# Patient Record
Sex: Female | Born: 1940 | Race: White | Hispanic: No | Smoking: Never smoker
Health system: Southern US, Community
[De-identification: ages and names within clinical notes are randomized; demographics above are authoritative.]

## PROBLEM LIST (undated history)

## (undated) DIAGNOSIS — R112 Nausea with vomiting, unspecified: Secondary | ICD-10-CM

## (undated) DIAGNOSIS — M069 Rheumatoid arthritis, unspecified: Secondary | ICD-10-CM

## (undated) DIAGNOSIS — I10 Essential (primary) hypertension: Secondary | ICD-10-CM

## (undated) DIAGNOSIS — K921 Melena: Secondary | ICD-10-CM

## (undated) DIAGNOSIS — R197 Diarrhea, unspecified: Secondary | ICD-10-CM

## (undated) DIAGNOSIS — J019 Acute sinusitis, unspecified: Secondary | ICD-10-CM

## (undated) DIAGNOSIS — J309 Allergic rhinitis, unspecified: Secondary | ICD-10-CM

## (undated) DIAGNOSIS — N189 Chronic kidney disease, unspecified: Secondary | ICD-10-CM

## (undated) DIAGNOSIS — R310 Gross hematuria: Secondary | ICD-10-CM

## (undated) DIAGNOSIS — Z5189 Encounter for other specified aftercare: Secondary | ICD-10-CM

## (undated) DIAGNOSIS — Z9889 Other specified postprocedural states: Secondary | ICD-10-CM

## (undated) DIAGNOSIS — F319 Bipolar disorder, unspecified: Secondary | ICD-10-CM

## (undated) DIAGNOSIS — I701 Atherosclerosis of renal artery: Secondary | ICD-10-CM

## (undated) DIAGNOSIS — I480 Paroxysmal atrial fibrillation: Secondary | ICD-10-CM

## (undated) DIAGNOSIS — D509 Iron deficiency anemia, unspecified: Secondary | ICD-10-CM

## (undated) DIAGNOSIS — H4940 Progressive external ophthalmoplegia, unspecified eye: Secondary | ICD-10-CM

## (undated) DIAGNOSIS — I82409 Acute embolism and thrombosis of unspecified deep veins of unspecified lower extremity: Secondary | ICD-10-CM

## (undated) DIAGNOSIS — C50919 Malignant neoplasm of unspecified site of unspecified female breast: Secondary | ICD-10-CM

## (undated) DIAGNOSIS — M1990 Unspecified osteoarthritis, unspecified site: Secondary | ICD-10-CM

## (undated) DIAGNOSIS — M797 Fibromyalgia: Secondary | ICD-10-CM

## (undated) DIAGNOSIS — C539 Malignant neoplasm of cervix uteri, unspecified: Secondary | ICD-10-CM

## (undated) DIAGNOSIS — M359 Systemic involvement of connective tissue, unspecified: Secondary | ICD-10-CM

## (undated) DIAGNOSIS — E039 Hypothyroidism, unspecified: Secondary | ICD-10-CM

## (undated) DIAGNOSIS — J45909 Unspecified asthma, uncomplicated: Secondary | ICD-10-CM

## (undated) DIAGNOSIS — R52 Pain, unspecified: Secondary | ICD-10-CM

## (undated) DIAGNOSIS — T7840XA Allergy, unspecified, initial encounter: Secondary | ICD-10-CM

## (undated) DIAGNOSIS — F32A Depression, unspecified: Secondary | ICD-10-CM

## (undated) DIAGNOSIS — Z87442 Personal history of urinary calculi: Secondary | ICD-10-CM

## (undated) DIAGNOSIS — H499 Unspecified paralytic strabismus: Secondary | ICD-10-CM

## (undated) DIAGNOSIS — Z8659 Personal history of other mental and behavioral disorders: Secondary | ICD-10-CM

## (undated) DIAGNOSIS — F329 Major depressive disorder, single episode, unspecified: Secondary | ICD-10-CM

## (undated) DIAGNOSIS — J189 Pneumonia, unspecified organism: Secondary | ICD-10-CM

## (undated) DIAGNOSIS — K219 Gastro-esophageal reflux disease without esophagitis: Secondary | ICD-10-CM

## (undated) DIAGNOSIS — K2270 Barrett's esophagus without dysplasia: Secondary | ICD-10-CM

## (undated) DIAGNOSIS — K2970 Gastritis, unspecified, without bleeding: Secondary | ICD-10-CM

## (undated) DIAGNOSIS — E785 Hyperlipidemia, unspecified: Secondary | ICD-10-CM

## (undated) DIAGNOSIS — G5600 Carpal tunnel syndrome, unspecified upper limb: Secondary | ICD-10-CM

## (undated) DIAGNOSIS — R451 Restlessness and agitation: Secondary | ICD-10-CM

## (undated) HISTORY — DX: Hyperlipidemia, unspecified: E78.5

## (undated) HISTORY — DX: Progressive external ophthalmoplegia, unspecified eye: H49.40

## (undated) HISTORY — DX: Gastritis, unspecified, without bleeding: K29.70

## (undated) HISTORY — DX: Acute embolism and thrombosis of unspecified deep veins of unspecified lower extremity: I82.409

## (undated) HISTORY — DX: Pneumonia, unspecified organism: J18.9

## (undated) HISTORY — DX: Paroxysmal atrial fibrillation: I48.0

## (undated) HISTORY — DX: Major depressive disorder, single episode, unspecified: F32.9

## (undated) HISTORY — DX: Pain, unspecified: R52

## (undated) HISTORY — DX: Depression, unspecified: F32.A

## (undated) HISTORY — PX: FOOT SURGERY: SHX648

## (undated) HISTORY — DX: Bipolar disorder, unspecified: F31.9

## (undated) HISTORY — DX: Hypothyroidism, unspecified: E03.9

## (undated) HISTORY — DX: Unspecified osteoarthritis, unspecified site: M19.90

## (undated) HISTORY — PX: CARDIAC CATHETERIZATION: SHX172

## (undated) HISTORY — DX: Barrett's esophagus without dysplasia: K22.70

## (undated) HISTORY — PX: MOLE REMOVAL: SHX2046

## (undated) HISTORY — DX: Iron deficiency anemia, unspecified: D50.9

## (undated) HISTORY — DX: Melena: K92.1

## (undated) HISTORY — DX: Restlessness and agitation: R45.1

## (undated) HISTORY — PX: MASTECTOMY: SHX3

## (undated) HISTORY — DX: Essential (primary) hypertension: I10

## (undated) HISTORY — DX: Diarrhea, unspecified: R19.7

## (undated) HISTORY — PX: TRACHEAL DILITATION: SHX5068

## (undated) HISTORY — DX: Unspecified asthma, uncomplicated: J45.909

## (undated) HISTORY — PX: TONSILLECTOMY: SUR1361

## (undated) HISTORY — PX: COLONOSCOPY: SHX174

## (undated) HISTORY — DX: Gastro-esophageal reflux disease without esophagitis: K21.9

## (undated) HISTORY — DX: Malignant neoplasm of unspecified site of unspecified female breast: C50.919

## (undated) HISTORY — DX: Chronic kidney disease, unspecified: N18.9

## (undated) HISTORY — DX: Acute sinusitis, unspecified: J01.90

## (undated) HISTORY — DX: Personal history of urinary calculi: Z87.442

## (undated) HISTORY — PX: ESOPHAGOGASTRODUODENOSCOPY: SHX1529

## (undated) HISTORY — DX: Atherosclerosis of renal artery: I70.1

## (undated) HISTORY — PX: RENAL ARTERY ANGIOPLASTY: SHX2317

## (undated) HISTORY — PX: COSMETIC SURGERY: SHX468

## (undated) HISTORY — PX: RENAL ARTERY STENT: SHX2321

## (undated) HISTORY — DX: Fibromyalgia: M79.7

## (undated) HISTORY — DX: Allergic rhinitis, unspecified: J30.9

## (undated) HISTORY — PX: EYE SURGERY: SHX253

## (undated) HISTORY — DX: Personal history of other mental and behavioral disorders: Z86.59

## (undated) HISTORY — PX: MRI: SHX5353

## (undated) HISTORY — DX: Gross hematuria: R31.0

## (undated) HISTORY — DX: Rheumatoid arthritis, unspecified: M06.9

## (undated) HISTORY — PX: ABDOMINAL HYSTERECTOMY: SHX81

## (undated) HISTORY — PX: APPENDECTOMY: SHX54

## (undated) HISTORY — DX: Encounter for other specified aftercare: Z51.89

## (undated) HISTORY — DX: Carpal tunnel syndrome, unspecified upper limb: G56.00

## (undated) HISTORY — DX: Allergy, unspecified, initial encounter: T78.40XA

## (undated) HISTORY — DX: Unspecified paralytic strabismus: H49.9

---

## 1968-05-28 HISTORY — PX: PARTIAL HYSTERECTOMY: SHX80

## 1971-05-29 DIAGNOSIS — C50919 Malignant neoplasm of unspecified site of unspecified female breast: Secondary | ICD-10-CM

## 1971-05-29 HISTORY — DX: Malignant neoplasm of unspecified site of unspecified female breast: C50.919

## 1990-05-28 HISTORY — PX: OTHER SURGICAL HISTORY: SHX169

## 1992-05-28 HISTORY — PX: OTHER SURGICAL HISTORY: SHX169

## 1995-08-27 ENCOUNTER — Encounter: Payer: Self-pay | Admitting: Family Medicine

## 1997-11-25 HISTORY — PX: CHOLECYSTECTOMY: SHX55

## 1999-03-16 ENCOUNTER — Other Ambulatory Visit: Admission: RE | Admit: 1999-03-16 | Discharge: 1999-03-16 | Payer: Self-pay | Admitting: Family Medicine

## 2000-09-11 ENCOUNTER — Other Ambulatory Visit: Admission: RE | Admit: 2000-09-11 | Discharge: 2000-09-11 | Payer: Self-pay | Admitting: Gastroenterology

## 2000-09-11 ENCOUNTER — Encounter (INDEPENDENT_AMBULATORY_CARE_PROVIDER_SITE_OTHER): Payer: Self-pay | Admitting: Specialist

## 2000-09-11 HISTORY — PX: COLONOSCOPY: SHX174

## 2000-09-11 HISTORY — PX: ESOPHAGOGASTRODUODENOSCOPY: SHX1529

## 2000-10-11 HISTORY — PX: OTHER SURGICAL HISTORY: SHX169

## 2000-10-24 ENCOUNTER — Encounter (INDEPENDENT_AMBULATORY_CARE_PROVIDER_SITE_OTHER): Payer: Self-pay | Admitting: Specialist

## 2000-10-24 ENCOUNTER — Other Ambulatory Visit: Admission: RE | Admit: 2000-10-24 | Discharge: 2000-10-24 | Payer: Self-pay | Admitting: Gastroenterology

## 2000-10-24 HISTORY — PX: ESOPHAGOGASTRODUODENOSCOPY: SHX1529

## 2003-01-27 ENCOUNTER — Encounter: Payer: Self-pay | Admitting: Family Medicine

## 2003-02-22 ENCOUNTER — Other Ambulatory Visit: Admission: RE | Admit: 2003-02-22 | Discharge: 2003-02-22 | Payer: Self-pay | Admitting: Family Medicine

## 2003-02-23 HISTORY — PX: ESOPHAGOGASTRODUODENOSCOPY: SHX1529

## 2004-01-28 HISTORY — PX: ESOPHAGOGASTRODUODENOSCOPY: SHX1529

## 2004-03-16 HISTORY — PX: ESOPHAGOGASTRODUODENOSCOPY: SHX1529

## 2004-03-30 ENCOUNTER — Ambulatory Visit: Payer: Self-pay | Admitting: Gastroenterology

## 2004-04-07 ENCOUNTER — Ambulatory Visit: Payer: Self-pay | Admitting: Ophthalmology

## 2004-04-11 ENCOUNTER — Ambulatory Visit (HOSPITAL_COMMUNITY): Admission: RE | Admit: 2004-04-11 | Discharge: 2004-04-11 | Payer: Self-pay | Admitting: Gastroenterology

## 2004-04-11 ENCOUNTER — Ambulatory Visit: Payer: Self-pay | Admitting: Gastroenterology

## 2004-04-25 ENCOUNTER — Ambulatory Visit: Payer: Self-pay | Admitting: Gastroenterology

## 2004-05-11 ENCOUNTER — Ambulatory Visit: Payer: Self-pay | Admitting: Gastroenterology

## 2004-05-26 ENCOUNTER — Ambulatory Visit: Payer: Self-pay | Admitting: Gastroenterology

## 2004-06-02 ENCOUNTER — Ambulatory Visit: Payer: Self-pay | Admitting: Gastroenterology

## 2004-06-06 ENCOUNTER — Ambulatory Visit: Payer: Self-pay | Admitting: Family Medicine

## 2004-06-06 ENCOUNTER — Ambulatory Visit: Payer: Self-pay | Admitting: Gastroenterology

## 2004-06-28 ENCOUNTER — Ambulatory Visit: Payer: Self-pay | Admitting: Gastroenterology

## 2004-07-17 ENCOUNTER — Ambulatory Visit: Payer: Self-pay | Admitting: Gastroenterology

## 2004-09-05 ENCOUNTER — Ambulatory Visit: Payer: Self-pay | Admitting: Family Medicine

## 2004-09-26 ENCOUNTER — Ambulatory Visit: Payer: Self-pay | Admitting: Family Medicine

## 2004-12-21 ENCOUNTER — Ambulatory Visit: Payer: Self-pay | Admitting: Family Medicine

## 2005-01-02 ENCOUNTER — Ambulatory Visit: Payer: Self-pay | Admitting: Family Medicine

## 2005-02-02 ENCOUNTER — Ambulatory Visit: Payer: Self-pay | Admitting: Family Medicine

## 2005-02-15 ENCOUNTER — Ambulatory Visit: Payer: Self-pay

## 2005-02-15 HISTORY — PX: OTHER SURGICAL HISTORY: SHX169

## 2005-03-05 ENCOUNTER — Ambulatory Visit: Payer: Self-pay | Admitting: Family Medicine

## 2005-05-08 ENCOUNTER — Ambulatory Visit: Payer: Self-pay | Admitting: Family Medicine

## 2005-07-15 ENCOUNTER — Emergency Department (HOSPITAL_COMMUNITY): Admission: EM | Admit: 2005-07-15 | Discharge: 2005-07-15 | Payer: Self-pay | Admitting: Emergency Medicine

## 2005-07-16 LAB — HM MAMMOGRAPHY: HM Mammogram: NORMAL

## 2005-07-19 ENCOUNTER — Ambulatory Visit: Payer: Self-pay | Admitting: Family Medicine

## 2005-07-25 ENCOUNTER — Encounter: Payer: Self-pay | Admitting: Family Medicine

## 2005-07-26 ENCOUNTER — Encounter: Payer: Self-pay | Admitting: Family Medicine

## 2005-08-08 ENCOUNTER — Ambulatory Visit: Payer: Self-pay | Admitting: Family Medicine

## 2005-08-21 ENCOUNTER — Ambulatory Visit: Payer: Self-pay | Admitting: Family Medicine

## 2005-08-26 ENCOUNTER — Encounter: Payer: Self-pay | Admitting: Family Medicine

## 2005-09-03 ENCOUNTER — Emergency Department: Payer: Self-pay | Admitting: Emergency Medicine

## 2005-09-10 HISTORY — PX: OTHER SURGICAL HISTORY: SHX169

## 2005-09-11 ENCOUNTER — Ambulatory Visit: Payer: Self-pay | Admitting: Family Medicine

## 2005-09-12 ENCOUNTER — Encounter: Admission: RE | Admit: 2005-09-12 | Discharge: 2005-09-12 | Payer: Self-pay | Admitting: Family Medicine

## 2005-09-12 ENCOUNTER — Ambulatory Visit: Payer: Self-pay | Admitting: *Deleted

## 2005-09-12 HISTORY — PX: OTHER SURGICAL HISTORY: SHX169

## 2005-09-12 HISTORY — PX: MRI: SHX5353

## 2005-09-24 HISTORY — PX: OTHER SURGICAL HISTORY: SHX169

## 2005-10-16 ENCOUNTER — Emergency Department: Payer: Self-pay | Admitting: Emergency Medicine

## 2005-10-16 ENCOUNTER — Ambulatory Visit: Payer: Self-pay | Admitting: Family Medicine

## 2006-02-19 ENCOUNTER — Emergency Department: Payer: Self-pay | Admitting: Emergency Medicine

## 2006-03-08 ENCOUNTER — Ambulatory Visit: Payer: Self-pay | Admitting: Family Medicine

## 2006-03-20 ENCOUNTER — Ambulatory Visit: Payer: Self-pay | Admitting: Family Medicine

## 2006-08-12 ENCOUNTER — Ambulatory Visit: Payer: Self-pay | Admitting: Family Medicine

## 2006-08-12 LAB — CONVERTED CEMR LAB
ALT: 15 units/L (ref 0–40)
AST: 22 units/L (ref 0–37)
Alkaline Phosphatase: 66 units/L (ref 39–117)
Basophils Absolute: 0.1 10*3/uL (ref 0.0–0.1)
Basophils Relative: 1.6 % — ABNORMAL HIGH (ref 0.0–1.0)
CO2: 28 meq/L (ref 19–32)
Ferritin: 5 ng/mL — ABNORMAL LOW (ref 10.0–291.0)
Free T4: 0.7 ng/dL (ref 0.6–1.6)
GFR calc non Af Amer: 89 mL/min
Glucose, Bld: 93 mg/dL (ref 70–99)
Hemoglobin: 11.5 g/dL — ABNORMAL LOW (ref 12.0–15.0)
MCV: 71.9 fL — ABNORMAL LOW (ref 78.0–100.0)
Monocytes Relative: 7.6 % (ref 3.0–11.0)
Neutro Abs: 3.6 10*3/uL (ref 1.4–7.7)
Neutrophils Relative %: 62.1 % (ref 43.0–77.0)
Platelets: 399 10*3/uL (ref 150–400)
Total Bilirubin: 0.5 mg/dL (ref 0.3–1.2)
Total CHOL/HDL Ratio: 3.7
Triglycerides: 238 mg/dL (ref 0–149)
Vitamin B-12: >1500 pg/mL — ABNORMAL HIGH (ref 211–911)
WBC: 5.8 10*3/uL (ref 4.5–10.5)

## 2006-09-09 ENCOUNTER — Ambulatory Visit: Payer: Self-pay | Admitting: Family Medicine

## 2006-09-10 LAB — CONVERTED CEMR LAB: OCCULT 1: POSITIVE

## 2006-09-25 LAB — CONVERTED CEMR LAB: OCCULT 2: POSITIVE

## 2006-09-30 ENCOUNTER — Telehealth: Payer: Self-pay | Admitting: Family Medicine

## 2006-10-07 ENCOUNTER — Ambulatory Visit: Payer: Self-pay | Admitting: Family Medicine

## 2006-10-10 ENCOUNTER — Ambulatory Visit: Payer: Self-pay | Admitting: Family Medicine

## 2006-10-25 ENCOUNTER — Encounter: Payer: Self-pay | Admitting: Family Medicine

## 2006-10-25 DIAGNOSIS — Z8659 Personal history of other mental and behavioral disorders: Secondary | ICD-10-CM

## 2006-10-25 DIAGNOSIS — K253 Acute gastric ulcer without hemorrhage or perforation: Secondary | ICD-10-CM | POA: Insufficient documentation

## 2006-10-25 DIAGNOSIS — IMO0001 Reserved for inherently not codable concepts without codable children: Secondary | ICD-10-CM

## 2006-10-25 HISTORY — DX: Personal history of other mental and behavioral disorders: Z86.59

## 2006-10-26 DIAGNOSIS — I10 Essential (primary) hypertension: Secondary | ICD-10-CM

## 2006-10-26 DIAGNOSIS — J309 Allergic rhinitis, unspecified: Secondary | ICD-10-CM

## 2006-10-26 DIAGNOSIS — E039 Hypothyroidism, unspecified: Secondary | ICD-10-CM

## 2006-10-26 DIAGNOSIS — E782 Mixed hyperlipidemia: Secondary | ICD-10-CM

## 2006-10-26 HISTORY — DX: Essential (primary) hypertension: I10

## 2006-10-26 HISTORY — DX: Allergic rhinitis, unspecified: J30.9

## 2006-10-28 ENCOUNTER — Ambulatory Visit: Payer: Self-pay | Admitting: Family Medicine

## 2006-10-28 DIAGNOSIS — D509 Iron deficiency anemia, unspecified: Secondary | ICD-10-CM | POA: Insufficient documentation

## 2006-10-28 DIAGNOSIS — K5900 Constipation, unspecified: Secondary | ICD-10-CM | POA: Insufficient documentation

## 2006-11-08 ENCOUNTER — Encounter (INDEPENDENT_AMBULATORY_CARE_PROVIDER_SITE_OTHER): Payer: Self-pay | Admitting: *Deleted

## 2006-12-05 ENCOUNTER — Telehealth: Payer: Self-pay | Admitting: Family Medicine

## 2006-12-30 ENCOUNTER — Encounter: Payer: Self-pay | Admitting: Family Medicine

## 2006-12-31 ENCOUNTER — Telehealth: Payer: Self-pay | Admitting: Family Medicine

## 2007-01-08 ENCOUNTER — Ambulatory Visit: Payer: Self-pay | Admitting: Gastroenterology

## 2007-01-08 ENCOUNTER — Other Ambulatory Visit: Payer: Self-pay

## 2007-01-23 ENCOUNTER — Ambulatory Visit: Payer: Self-pay | Admitting: Gastroenterology

## 2007-01-23 ENCOUNTER — Encounter: Payer: Self-pay | Admitting: Family Medicine

## 2007-01-23 HISTORY — PX: ESOPHAGOGASTRODUODENOSCOPY: SHX1529

## 2007-02-13 ENCOUNTER — Telehealth: Payer: Self-pay | Admitting: Family Medicine

## 2007-02-26 ENCOUNTER — Ambulatory Visit: Payer: Self-pay | Admitting: Family Medicine

## 2007-03-10 ENCOUNTER — Ambulatory Visit: Payer: Self-pay | Admitting: Gastroenterology

## 2007-03-10 ENCOUNTER — Encounter: Payer: Self-pay | Admitting: Family Medicine

## 2007-03-10 HISTORY — PX: ESOPHAGOGASTRODUODENOSCOPY: SHX1529

## 2007-03-21 ENCOUNTER — Encounter: Payer: Self-pay | Admitting: Family Medicine

## 2007-03-26 ENCOUNTER — Encounter: Payer: Self-pay | Admitting: Family Medicine

## 2007-03-27 ENCOUNTER — Encounter: Payer: Self-pay | Admitting: Family Medicine

## 2007-04-07 ENCOUNTER — Ambulatory Visit: Payer: Self-pay | Admitting: Rheumatology

## 2007-04-21 ENCOUNTER — Encounter: Payer: Self-pay | Admitting: Family Medicine

## 2007-05-14 ENCOUNTER — Telehealth: Payer: Self-pay | Admitting: Family Medicine

## 2007-07-07 ENCOUNTER — Encounter: Payer: Self-pay | Admitting: Family Medicine

## 2007-07-23 ENCOUNTER — Telehealth: Payer: Self-pay | Admitting: Family Medicine

## 2007-07-25 ENCOUNTER — Encounter: Payer: Self-pay | Admitting: Family Medicine

## 2007-08-05 ENCOUNTER — Telehealth (INDEPENDENT_AMBULATORY_CARE_PROVIDER_SITE_OTHER): Payer: Self-pay | Admitting: Internal Medicine

## 2007-08-10 ENCOUNTER — Encounter: Payer: Self-pay | Admitting: Family Medicine

## 2007-08-18 ENCOUNTER — Encounter: Payer: Self-pay | Admitting: Family Medicine

## 2007-08-19 ENCOUNTER — Ambulatory Visit: Payer: Self-pay | Admitting: Unknown Physician Specialty

## 2007-09-02 ENCOUNTER — Encounter: Payer: Self-pay | Admitting: Family Medicine

## 2007-09-02 ENCOUNTER — Ambulatory Visit: Payer: Self-pay | Admitting: Unknown Physician Specialty

## 2007-09-02 HISTORY — PX: ESOPHAGOGASTRODUODENOSCOPY: SHX1529

## 2007-09-22 ENCOUNTER — Telehealth: Payer: Self-pay | Admitting: Family Medicine

## 2007-10-06 ENCOUNTER — Encounter: Payer: Self-pay | Admitting: Family Medicine

## 2007-10-06 ENCOUNTER — Ambulatory Visit: Payer: Self-pay | Admitting: Unknown Physician Specialty

## 2007-10-06 HISTORY — PX: COLONOSCOPY: SHX174

## 2007-10-06 LAB — HM COLONOSCOPY: HM Colonoscopy: NORMAL

## 2007-10-08 HISTORY — PX: TUMOR EXCISION: SHX421

## 2007-10-23 ENCOUNTER — Telehealth (INDEPENDENT_AMBULATORY_CARE_PROVIDER_SITE_OTHER): Payer: Self-pay | Admitting: Internal Medicine

## 2007-11-07 ENCOUNTER — Ambulatory Visit: Payer: Self-pay | Admitting: Family Medicine

## 2007-11-10 ENCOUNTER — Ambulatory Visit: Payer: Self-pay | Admitting: Unknown Physician Specialty

## 2007-11-10 ENCOUNTER — Encounter: Payer: Self-pay | Admitting: Family Medicine

## 2007-11-10 HISTORY — PX: ESOPHAGOGASTRODUODENOSCOPY: SHX1529

## 2007-11-11 ENCOUNTER — Telehealth: Payer: Self-pay | Admitting: Family Medicine

## 2007-11-12 ENCOUNTER — Encounter: Payer: Self-pay | Admitting: Family Medicine

## 2007-11-24 ENCOUNTER — Telehealth: Payer: Self-pay | Admitting: Family Medicine

## 2007-11-24 ENCOUNTER — Emergency Department: Payer: Self-pay | Admitting: Unknown Physician Specialty

## 2007-11-24 ENCOUNTER — Other Ambulatory Visit: Payer: Self-pay

## 2007-12-04 ENCOUNTER — Telehealth: Payer: Self-pay | Admitting: Family Medicine

## 2007-12-11 ENCOUNTER — Telehealth: Payer: Self-pay | Admitting: Family Medicine

## 2007-12-11 ENCOUNTER — Ambulatory Visit: Payer: Self-pay | Admitting: Family Medicine

## 2007-12-11 DIAGNOSIS — IMO0002 Reserved for concepts with insufficient information to code with codable children: Secondary | ICD-10-CM

## 2007-12-25 ENCOUNTER — Encounter: Payer: Self-pay | Admitting: Family Medicine

## 2008-01-12 ENCOUNTER — Ambulatory Visit: Payer: Self-pay | Admitting: Family Medicine

## 2008-01-16 ENCOUNTER — Encounter: Payer: Self-pay | Admitting: Family Medicine

## 2008-01-26 ENCOUNTER — Encounter (INDEPENDENT_AMBULATORY_CARE_PROVIDER_SITE_OTHER): Payer: Self-pay | Admitting: *Deleted

## 2008-02-24 ENCOUNTER — Ambulatory Visit: Payer: Self-pay | Admitting: Family Medicine

## 2008-03-08 ENCOUNTER — Telehealth: Payer: Self-pay | Admitting: Family Medicine

## 2008-04-12 ENCOUNTER — Ambulatory Visit: Payer: Self-pay | Admitting: Family Medicine

## 2008-06-15 ENCOUNTER — Telehealth: Payer: Self-pay | Admitting: Family Medicine

## 2008-06-24 ENCOUNTER — Telehealth: Payer: Self-pay | Admitting: Family Medicine

## 2008-07-12 ENCOUNTER — Telehealth: Payer: Self-pay | Admitting: Family Medicine

## 2008-07-12 ENCOUNTER — Ambulatory Visit: Payer: Self-pay | Admitting: Family Medicine

## 2008-07-12 DIAGNOSIS — R079 Chest pain, unspecified: Secondary | ICD-10-CM

## 2008-07-14 ENCOUNTER — Telehealth: Payer: Self-pay | Admitting: Family Medicine

## 2008-07-23 ENCOUNTER — Ambulatory Visit: Payer: Self-pay | Admitting: Family Medicine

## 2008-07-23 LAB — CONVERTED CEMR LAB
Direct LDL: 152.5 mg/dL
HDL: 48 mg/dL (ref >39.0)
TSH: 2.06 microintl units/mL (ref 0.35–5.50)
Triglycerides: 271 mg/dL (ref 0–149)

## 2008-08-02 ENCOUNTER — Encounter: Payer: Self-pay | Admitting: Family Medicine

## 2008-08-09 ENCOUNTER — Ambulatory Visit: Payer: Self-pay | Admitting: Family Medicine

## 2008-08-10 ENCOUNTER — Ambulatory Visit: Payer: Self-pay | Admitting: Cardiology

## 2008-08-19 ENCOUNTER — Ambulatory Visit: Payer: Self-pay | Admitting: Family Medicine

## 2008-08-25 ENCOUNTER — Telehealth: Payer: Self-pay | Admitting: Family Medicine

## 2008-08-26 ENCOUNTER — Encounter: Payer: Self-pay | Admitting: Family Medicine

## 2008-09-06 ENCOUNTER — Telehealth: Payer: Self-pay | Admitting: Family Medicine

## 2008-09-21 ENCOUNTER — Telehealth: Payer: Self-pay | Admitting: Family Medicine

## 2008-09-28 ENCOUNTER — Ambulatory Visit: Payer: Self-pay | Admitting: Family Medicine

## 2008-09-28 DIAGNOSIS — R197 Diarrhea, unspecified: Secondary | ICD-10-CM

## 2008-09-29 LAB — CONVERTED CEMR LAB
ALT: 16 units/L (ref 0–35)
AST: 22 units/L (ref 0–37)
Albumin: 4.1 g/dL (ref 3.5–5.2)
Alkaline Phosphatase: 75 units/L (ref 39–117)
BUN: 18 mg/dL (ref 6–23)
Bilirubin, Direct: 0 mg/dL (ref 0.0–0.3)
Chloride: 109 meq/L (ref 96–112)
HCT: 33.1 % — ABNORMAL LOW (ref 36.0–46.0)
Hemoglobin: 11.2 g/dL — ABNORMAL LOW (ref 12.0–15.0)
MCHC: 33.9 g/dL (ref 30.0–36.0)
Monocytes Absolute: 0.5 10*3/uL (ref 0.1–1.0)
Monocytes Relative: 7.1 % (ref 3.0–12.0)
Neutro Abs: 3.8 10*3/uL (ref 1.4–7.7)
Platelets: 296 10*3/uL (ref 150.0–400.0)
Potassium: 3.8 meq/L (ref 3.5–5.1)
RBC: 4.14 M/uL (ref 3.87–5.11)
WBC: 6.4 10*3/uL (ref 4.5–10.5)

## 2008-10-28 ENCOUNTER — Encounter: Payer: Self-pay | Admitting: Family Medicine

## 2008-11-08 ENCOUNTER — Ambulatory Visit: Payer: Self-pay | Admitting: Unknown Physician Specialty

## 2008-11-17 ENCOUNTER — Encounter: Payer: Self-pay | Admitting: Family Medicine

## 2008-11-24 ENCOUNTER — Ambulatory Visit: Payer: Self-pay | Admitting: Family Medicine

## 2008-11-24 ENCOUNTER — Telehealth: Payer: Self-pay | Admitting: Family Medicine

## 2008-12-20 ENCOUNTER — Telehealth: Payer: Self-pay | Admitting: Family Medicine

## 2009-01-03 ENCOUNTER — Encounter: Payer: Self-pay | Admitting: Family Medicine

## 2009-03-01 ENCOUNTER — Ambulatory Visit: Payer: Self-pay | Admitting: Family Medicine

## 2009-03-09 ENCOUNTER — Encounter: Payer: Self-pay | Admitting: Family Medicine

## 2009-03-21 ENCOUNTER — Telehealth: Payer: Self-pay | Admitting: Family Medicine

## 2009-03-22 ENCOUNTER — Ambulatory Visit: Payer: Self-pay | Admitting: Psychiatry

## 2009-04-05 ENCOUNTER — Ambulatory Visit: Payer: Self-pay | Admitting: Family Medicine

## 2009-04-05 DIAGNOSIS — R5383 Other fatigue: Secondary | ICD-10-CM

## 2009-04-05 DIAGNOSIS — R5381 Other malaise: Secondary | ICD-10-CM

## 2009-04-06 LAB — CONVERTED CEMR LAB
Albumin: 4.1 g/dL (ref 3.5–5.2)
BUN: 13 mg/dL (ref 6–23)
Basophils Absolute: 0.1 10*3/uL (ref 0.0–0.1)
Basophils Relative: 1.8 % (ref 0.0–3.0)
Creatinine, Ser: 0.7 mg/dL (ref 0.4–1.2)
Eosinophils Absolute: 0.3 10*3/uL (ref 0.0–0.7)
Eosinophils Relative: 4.6 % (ref 0.0–5.0)
Folate: 7.5 ng/mL
HCT: 38.1 % (ref 36.0–46.0)
Hemoglobin: 13.1 g/dL (ref 12.0–15.0)
MCHC: 34.3 g/dL (ref 30.0–36.0)
Monocytes Absolute: 0.4 10*3/uL (ref 0.1–1.0)
Phosphorus: 3.9 mg/dL (ref 2.3–4.6)
RBC: 4.25 M/uL (ref 3.87–5.11)
RDW: 12.8 % (ref 11.5–14.6)
Saturation Ratios: 5 % — ABNORMAL LOW (ref 20.0–50.0)
TSH: 2.53 microintl units/mL (ref 0.35–5.50)
Vitamin B-12: 282 pg/mL (ref 211–911)

## 2009-04-07 ENCOUNTER — Telehealth: Payer: Self-pay | Admitting: Family Medicine

## 2009-05-30 ENCOUNTER — Ambulatory Visit: Payer: Self-pay | Admitting: Family Medicine

## 2009-06-10 ENCOUNTER — Encounter: Payer: Self-pay | Admitting: Family Medicine

## 2009-06-20 ENCOUNTER — Telehealth: Payer: Self-pay | Admitting: Family Medicine

## 2009-07-06 ENCOUNTER — Encounter: Payer: Self-pay | Admitting: Family Medicine

## 2009-07-12 ENCOUNTER — Ambulatory Visit: Payer: Self-pay | Admitting: Family Medicine

## 2009-07-12 DIAGNOSIS — R300 Dysuria: Secondary | ICD-10-CM

## 2009-07-12 LAB — CONVERTED CEMR LAB
Bilirubin Urine: NEGATIVE
Ketones, urine, test strip: NEGATIVE
Nitrite: NEGATIVE
Protein, U semiquant: NEGATIVE
pH: 5

## 2009-08-23 ENCOUNTER — Ambulatory Visit: Payer: Self-pay | Admitting: Family Medicine

## 2009-08-24 ENCOUNTER — Telehealth: Payer: Self-pay | Admitting: Family Medicine

## 2009-09-02 ENCOUNTER — Telehealth: Payer: Self-pay | Admitting: Family Medicine

## 2009-09-02 DIAGNOSIS — C50919 Malignant neoplasm of unspecified site of unspecified female breast: Secondary | ICD-10-CM | POA: Insufficient documentation

## 2009-09-19 ENCOUNTER — Telehealth: Payer: Self-pay | Admitting: Family Medicine

## 2009-10-03 ENCOUNTER — Encounter: Payer: Self-pay | Admitting: Family Medicine

## 2009-10-04 ENCOUNTER — Ambulatory Visit: Payer: Self-pay | Admitting: Family Medicine

## 2009-10-10 ENCOUNTER — Encounter: Payer: Self-pay | Admitting: Family Medicine

## 2009-10-18 ENCOUNTER — Telehealth: Payer: Self-pay | Admitting: Family Medicine

## 2009-10-18 ENCOUNTER — Encounter: Payer: Self-pay | Admitting: Family Medicine

## 2009-11-15 ENCOUNTER — Encounter: Payer: Self-pay | Admitting: Family Medicine

## 2009-11-16 ENCOUNTER — Telehealth: Payer: Self-pay | Admitting: Family Medicine

## 2009-11-18 ENCOUNTER — Telehealth: Payer: Self-pay | Admitting: Internal Medicine

## 2009-12-08 ENCOUNTER — Encounter: Payer: Self-pay | Admitting: Family Medicine

## 2009-12-15 ENCOUNTER — Telehealth: Payer: Self-pay | Admitting: Family Medicine

## 2009-12-28 ENCOUNTER — Encounter (INDEPENDENT_AMBULATORY_CARE_PROVIDER_SITE_OTHER): Payer: Self-pay | Admitting: *Deleted

## 2009-12-28 ENCOUNTER — Telehealth: Payer: Self-pay | Admitting: Family Medicine

## 2009-12-28 ENCOUNTER — Encounter: Payer: Self-pay | Admitting: Family Medicine

## 2009-12-29 ENCOUNTER — Encounter (INDEPENDENT_AMBULATORY_CARE_PROVIDER_SITE_OTHER): Payer: Self-pay | Admitting: *Deleted

## 2010-01-24 ENCOUNTER — Encounter: Payer: Self-pay | Admitting: Family Medicine

## 2010-01-26 DIAGNOSIS — R52 Pain, unspecified: Secondary | ICD-10-CM

## 2010-01-26 HISTORY — DX: Pain, unspecified: R52

## 2010-02-07 ENCOUNTER — Ambulatory Visit: Payer: Self-pay | Admitting: Family Medicine

## 2010-02-07 DIAGNOSIS — L309 Dermatitis, unspecified: Secondary | ICD-10-CM

## 2010-02-08 ENCOUNTER — Encounter: Payer: Self-pay | Admitting: Family Medicine

## 2010-02-14 ENCOUNTER — Telehealth: Payer: Self-pay | Admitting: Family Medicine

## 2010-02-15 ENCOUNTER — Telehealth: Payer: Self-pay | Admitting: Family Medicine

## 2010-02-23 ENCOUNTER — Ambulatory Visit: Payer: Self-pay | Admitting: Family Medicine

## 2010-02-23 DIAGNOSIS — M79609 Pain in unspecified limb: Secondary | ICD-10-CM

## 2010-03-01 ENCOUNTER — Telehealth: Payer: Self-pay | Admitting: Family Medicine

## 2010-03-03 ENCOUNTER — Telehealth: Payer: Self-pay | Admitting: Family Medicine

## 2010-03-06 ENCOUNTER — Encounter: Payer: Self-pay | Admitting: Family Medicine

## 2010-03-13 ENCOUNTER — Telehealth: Payer: Self-pay | Admitting: Family Medicine

## 2010-03-14 ENCOUNTER — Telehealth: Payer: Self-pay | Admitting: Family Medicine

## 2010-03-15 ENCOUNTER — Encounter: Payer: Self-pay | Admitting: Family Medicine

## 2010-03-15 ENCOUNTER — Telehealth: Payer: Self-pay | Admitting: Family Medicine

## 2010-03-30 ENCOUNTER — Ambulatory Visit: Payer: Self-pay | Admitting: Family Medicine

## 2010-03-30 ENCOUNTER — Telehealth (INDEPENDENT_AMBULATORY_CARE_PROVIDER_SITE_OTHER): Payer: Self-pay | Admitting: *Deleted

## 2010-03-30 LAB — CONVERTED CEMR LAB
Albumin: 4.3 g/dL (ref 3.5–5.2)
Alkaline Phosphatase: 80 units/L (ref 39–117)
BUN: 13 mg/dL (ref 6–23)
Basophils Relative: 0.8 % (ref 0.0–3.0)
Bilirubin, Direct: 0.1 mg/dL (ref 0.0–0.3)
CO2: 27 meq/L (ref 19–32)
Cholesterol: 228 mg/dL — ABNORMAL HIGH (ref 0–200)
Creatinine, Ser: 0.8 mg/dL (ref 0.4–1.2)
Eosinophils Absolute: 0.2 10*3/uL (ref 0.0–0.7)
Eosinophils Relative: 3.4 % (ref 0.0–5.0)
Glucose, Bld: 83 mg/dL (ref 70–99)
Hemoglobin: 15.8 g/dL — ABNORMAL HIGH (ref 12.0–15.0)
Lymphocytes Relative: 32.8 % (ref 12.0–46.0)
Lymphs Abs: 2.4 10*3/uL (ref 0.7–4.0)
MCV: 98.4 fL (ref 78.0–100.0)
Sodium: 143 meq/L (ref 135–145)
Total Bilirubin: 0.7 mg/dL (ref 0.3–1.2)
Triglycerides: 454 mg/dL — ABNORMAL HIGH (ref 0.0–149.0)
WBC: 7.3 10*3/uL (ref 4.5–10.5)

## 2010-04-03 ENCOUNTER — Ambulatory Visit: Payer: Self-pay | Admitting: Family Medicine

## 2010-04-06 ENCOUNTER — Telehealth: Payer: Self-pay | Admitting: Family Medicine

## 2010-04-11 ENCOUNTER — Ambulatory Visit: Payer: Self-pay | Admitting: Family Medicine

## 2010-04-13 ENCOUNTER — Encounter: Payer: Self-pay | Admitting: Family Medicine

## 2010-04-13 ENCOUNTER — Encounter (INDEPENDENT_AMBULATORY_CARE_PROVIDER_SITE_OTHER): Payer: Self-pay | Admitting: *Deleted

## 2010-05-02 ENCOUNTER — Telehealth: Payer: Self-pay | Admitting: Family Medicine

## 2010-05-18 ENCOUNTER — Ambulatory Visit: Payer: Self-pay | Admitting: Family Medicine

## 2010-06-13 ENCOUNTER — Telehealth: Payer: Self-pay | Admitting: Family Medicine

## 2010-06-29 NOTE — Miscellaneous (Signed)
Summary: [name] Plan of Care  [facility] Physical Therapy Plan of Care   Imported By: Beau Fanny 06/20/2009 09:53:18  _____________________________________________________________________  External Attachment:    Type:   Image     Comment:   External Document

## 2010-06-29 NOTE — Miscellaneous (Signed)
   Clinical Lists Changes  Observations: Added new observation of ZOSTAVAX: Zostavax (03/06/2010 11:49)      Immunization History:  Zostavax History:    Zostavax # 1:  Zostavax (03/06/2010)

## 2010-06-29 NOTE — Medication Information (Signed)
Summary: Fax Regarding Meds & Zostavax Order/[facility]  Zostavax Order/[facility]   Imported By: Lanelle Bal 03/09/2010 10:53:01  _____________________________________________________________________  External Attachment:    Type:   Image     Comment:   External Document

## 2010-06-29 NOTE — Letter (Signed)
Summary: [facility]: Immunoassay Fecal Occult Blood (iFOB) Order Form  [place] at [facility]  [address] [place], [place] [address]   Phone: [phone]  Fax: [phone]      [facility]: Immunoassay Fecal Occult Blood (iFOB) Order Form   April 03, 2010 MRN: [id number]   Olivia [name] March 24, 1941   Physicican Name:_____[email or web address]___________________  Diagnosis Code:_____duncan_____________________      Crawford Givens MD

## 2010-06-29 NOTE — Letter (Signed)
Summary: Nadara Eaton letter  [place] at [facility]  [address] [place], [place] [address]   Phone: [phone]  Fax: [phone]       12/29/2009 MRN: [id number]  [facility] [address] [place], [place]  [address]  Dear Ms. [facility] [place], and [place] announce the retirement of Arta Silence, M.D., from full-time practice at the [facility] office effective November 24, 2009 and his plans of returning part-time.  It is important to Dr. Hetty Ely and to our practice that you understand that [facility] has seven physicians in our office for your health care needs.  We will continue to offer the same exceptional care that you have today.    Dr. Hetty Ely has spoken to many of you about his plans for retirement and returning part-time in the fall.   We will continue to work with you through the transition to schedule appointments for you in the office and meet the high standards that [facility] is committed to.   Again, it is with great pleasure that we share the news that Dr. Hetty Ely will return to [facility] in October of 2011 with a reduced schedule.    If you have any questions, or would like to request an appointment with one of our physicians, please call us at [phone] and press the option for Scheduling an appointment.  We take pleasure in providing you with excellent patient care and look forward to seeing you at your next office visit.  Our [facility] Physicians are:  Tillman Abide, M.D. Laurita Quint, M.D. Roxy Manns, M.D. Kerby Nora, M.D. Hannah Beat, M.D. Ruthe Mannan, M.D. We proudly welcomed Raechel Ache, M.D. and Eustaquio Boyden, M.D. to the practice in July/August 2011.  Sincerely,  [facility]

## 2010-06-29 NOTE — Assessment & Plan Note (Signed)
Summary: 6WK FOLLOW UP / LFW   Vital Signs:  Patient profile:   70 year old female Weight:      177 pounds Temp:     97.6 degrees F oral Pulse rate:   80 / minute Pulse rhythm:   regular BP sitting:   118 / 70  (left arm) Cuff size:   large  Vitals Entered By: Sydell Axon LPN (Oct 04, 2009 2:36 PM) CC: 6 Week follow-up, BP with patient's machine was 140/69   History of Present Illness: Pt here for BP recheck. She runs 160/90 generally. Her machine registers approx 20-34mm  She has lots of stress in her life. She is being seen at [facility] for genetic testing.Is due to be doing that last week.  She feels well today with no complaints except for stress.  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) 12)  Anemia, Iron Deficiency  (ICD-280.9) 13)  Hypothyroidism  (ICD-244.9) 14)  Allergy, Immunotherapy  (ICD-995.3) 15)  Allergic Rhinitis  (ICD-477.9) 16)  Fibromyalgia  (ICD-729.1) 17)  Rheumatoid Arthritis (DR KERNODLE)  (ICD-714.0) 18)  Osteoarthritis (DR ZIEMINSKI/DR Gavin Potters)  (ICD-715.90) 19)  Osteoarthrosis, Generalized, Unspc Site Dr Jimmy Footman  (ICD-715.00) 20)  Diarrhea  (ICD-787.91) 21)  Constipation, Mild  (ICD-564.00) 22)  Gastritis / Barretts/stricture  (ICD-535.50) 23)  Gastric Ulcer, Acute  (ICD-531.30) 24)  Blood in Stool Via Stool Cards  (ICD-578.1) 25)  Screening For Malignannt Neoplasm, Site Nec  (ICD-V76.49) 26)  Bipolar Affective Disorder, Depressed, Hx of  (ICD-V11.8)  Medications Prior to Update: 1)  Levoxyl 50 Mcg Tabs (Levothyroxine Sodium) .Marland Kitchen.. 1 Tablet By  Mouth Once A Day 2)  Tramadol Hcl 50 Mg  Tabs (Tramadol Hcl) .Marland Kitchen.. 1 By Mouth 1 Daily 3)  Xanax Xr 2 Mg  Tb24 (Alprazolam) .Marland Kitchen.. 1 Tablet Two  Times A Day With 1/2 in Afternoon,by Mouth 4)  Citalopram Hydrobromide 20 Mg  Tabs (Citalopram Hydrobromide) .Marland Kitchen.. 1 Daily By Mouth 5)  Legatrin Pm .Marland KitchenMarland Kitchen. 1 At Bedtime 6)  Nexium 40 Mg Cpdr (Esomeprazole Magnesium) .Marland Kitchen.. 1 Daily By Mouth 45 Mins Before Brkfst. and At Bedtime 7)  Mirtazapine 15 Mg Tabs (Mirtazapine) .... 8 Pm Daily By Mouth 8)  Cholestyramine Light 4 Gm/dose Powd (Cholestyramine Light) .... Two Times A Day 9)  Tessalon 200 Mg Caps (Benzonatate) .... One Tab By Mouth Three Times A Day, Last Dose Being One Hour Prior To Bed 10)  Greenmint Mouthwash  Liqd (Mouthwashes) 11)  Toprol Xl 25 Mg Xr24h-Tab (Metoprolol Succinate) .Marland Kitchen.. 11/2 Tabs  By Mouth At Night 12)  Sandoz .... One Scoop Two Times A Day 13)  Patanol 0.1 % Soln (Olopatadine Hcl) .... One Drop Each Eye Once A Day.  Allergies: 1)  ! * Mestinon 2)  ! * Demerol  Physical Exam  General:  Well-developed,well-nourished,in no acute distress; alert,appropriate and cooperative throughout examination, minimally congested. Head:  Normocephalic and atraumatic without obvious abnormalities. No apparent alopecia or balding. Sinuses NT. Eyes:  Conjunctiva clear bilaterally. Mildly generally inflamed from allergies, but better than last time.. Ears:  External ear exam shows no significant lesions  or deformities.  Otoscopic examination reveals clear canals, tympanic membranes are intact bilaterally without bulging, retraction, inflammation or discharge. Hearing is grossly normal bilaterally. Nose:  External nasal examination shows no deformity or inflammation. Nasal mucosa are pink and moist without lesions or exudates. Mouth:  Oral mucosa and oropharynx without lesions or exudates.  Teeth in good repair. Neck:  No deformities, masses, or tenderness noted. Lungs:  Normal respiratory effort, chest  expands symmetrically. Lungs are clear to auscultation, no crackles or wheezes. Heart:  Normal rate and regular rhythm. S1 and S2 normal without gallop, murmur, click, rub or other extra sounds.   Impression & Recommendations:  Problem # 1:  HYPERTENSION (ICD-401.9) Assessment Improved Cont curr meds. Machine at home too high. Will recheck in Oct/Nov. Her updated medication list for this problem includes:    Toprol Xl 25 Mg Xr24h-tab (Metoprolol succinate) .Marland Kitchen... 11/2 tabs  by mouth at night  BP today: 118/70 Prior BP: 128/86 (08/23/2009)  Labs Reviewed: K+: 4.5 (04/05/2009) Creat: : 0.7 (04/05/2009)   Chol: 262 (07/23/2008)   HDL: 48.0 (07/23/2008)   LDL: DEL (07/23/2008)   TG: 271 (07/23/2008)  Complete Medication List: 1)  Levoxyl 50 Mcg Tabs (Levothyroxine sodium) .Marland Kitchen.. 1 tablet by mouth once a day 2)  Tramadol Hcl 50 Mg Tabs (Tramadol hcl) .Marland Kitchen.. 1 by mouth 1 daily 3)  Xanax Xr 2 Mg Tb24 (Alprazolam) .Marland Kitchen.. 1 tablet two  times a day with 1/2 in afternoon,by mouth 4)  Citalopram Hydrobromide 20 Mg Tabs (Citalopram hydrobromide) .Marland Kitchen.. 1 daily by mouth 5)  Legatrin Pm  .Marland Kitchen.. 1 at bedtime 6)  Nexium 40 Mg Cpdr (Esomeprazole magnesium) .Marland Kitchen.. 1 daily by mouth 45 mins before brkfst. and at bedtime 7)  Mirtazapine 15 Mg Tabs (Mirtazapine) .... 8 pm daily by mouth 8)  Cholestyramine Light 4 Gm/dose Powd (Cholestyramine light) .... Two times a day 9)  Tessalon 200 Mg Caps (Benzonatate) .... One tab by mouth three times a day, last dose being one hour prior to bed 10)  Greenmint Mouthwash Liqd (Mouthwashes) 11)  Toprol Xl 25 Mg Xr24h-tab (Metoprolol succinate) .Marland Kitchen.. 11/2 tabs  by mouth at night 12)  Sandoz  .... One scoop two times a day 13)  Patanol 0.1 % Soln (Olopatadine hcl) .... One drop each eye once a day.  Current Allergies (reviewed today): ! * MESTINON ! * DEMEROL

## 2010-06-29 NOTE — Letter (Signed)
Summary: [facility]  [facility]   Imported By: Lanelle Bal 02/24/2010 09:35:54  _____________________________________________________________________  External Attachment:    Type:   Image     Comment:   External Document

## 2010-06-29 NOTE — Progress Notes (Signed)
Summary: refill request for tramadol  Phone Note Refill Request Message from:  Fax from Pharmacy  Refills Requested: Medication #1:  TRAMADOL HCL 50 MG  TABS 1 by mouth 1 DAILY   Last Refilled: 05/16/2010 Faxed request from [facility], [phone].  Initial call taken by: Lowella Petties CMA, AAMA,  June 13, 2010 3:31 PM  Follow-up for Phone Call        sent in.  Follow-up by: Crawford Givens MD,  June 13, 2010 4:36 PM    Prescriptions: TRAMADOL HCL 50 MG  TABS (TRAMADOL HCL) 1 by mouth 1 DAILY  #30 x 2   Entered and Authorized by:   Crawford Givens MD   Signed by:   Crawford Givens MD on 06/13/2010   Method used:   Electronically to        [facility] (retail)       [address]       [place], [place]  [address]       Ph: [phone]       Fax: [phone]   RxID:   [id number]

## 2010-06-29 NOTE — Medication Information (Signed)
Summary: Fax Regarding Meds & Zostavax with Additional Note/[facility]  Fax Regarding Meds & Zostavax with Additional Note/[facility]   Imported By: Lanelle Bal 03/22/2010 14:02:15  _____________________________________________________________________  External Attachment:    Type:   Image     Comment:   External Document

## 2010-06-29 NOTE — Letter (Signed)
Summary: Lab Results for [employer].  Lab Results for [employer]   Imported By: Maryln Gottron 04/21/2010 13:59:00  _____________________________________________________________________  External Attachment:    Type:   Image     Comment:   External Document

## 2010-06-29 NOTE — Consult Note (Signed)
Summary: Dr.Paul Marcom,Hereditary Cancer New Pt Eval.  Dr.Paul Marcom,Hereditary Cancer New Pt Eval.   Imported By: Beau Fanny 10/21/2009 09:31:17  _____________________________________________________________________  External Attachment:    Type:   Image     Comment:   External Document

## 2010-06-29 NOTE — Letter (Signed)
Summary: [facility]  [facility]   Imported By: Maryln Gottron 01/06/2010 13:59:14  _____________________________________________________________________  External Attachment:    Type:   Image     Comment:   External Document  Appended Document: [facility]     Clinical Lists Changes  Observations: Added new observation of PAST MED HX: CHEST PAIN (ICD-786.50) HYPERTENSION (ICD-401.9) HYPERLIPIDEMIA (ICD-272.4) AGITATION (ICD-307.9) MYASTHENIA GRAVIS WITHOUT EXACERBATION, OCULAR (ICD-358.00) CARPAL TUNNEL SYNDROME (DR KERNODLE) (ICD-354.0) ANEMIA, IRON DEFICIENCY (ICD-280.9) HYPOTHYROIDISM (ICD-244.9) ALLERGY, IMMUNOTHERAPY (ICD-995.3) ALLERGIC RHINITIS (ICD-477.9) FIBROMYALGIA (ICD-729.1) RHEUMATOID ARTHRITIS  (DR KERNODLE) (ICD-714.0) OSTEOARTHRITIS   (DR ZIEMINSKI/DR Gavin Potters) (ICD-715.90) OSTEOARTHROSIS, GENERALIZED, UNSPC SITE DR ZIEMINSKI (ICD-715.00) DIARRHEA (ICD-787.91) CONSTIPATION, MILD (ICD-564.00) GASTRITIS / BARRETTS/STRICTURE (ICD-535.50) GASTRIC ULCER, ACUTE (ICD-531.30) BLOOD IN STOOL VIA STOOL CARDS (ICD-578.1) BIPOLAR AFFECTIVE DISORDER, DEPRESSED, HX OF (ICD-V11.8) 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 at [facility] (01/07/2010 19:33)       Past History:  Past Medical History: CHEST PAIN (ICD-786.50) HYPERTENSION (ICD-401.9) HYPERLIPIDEMIA (ICD-272.4) AGITATION (ICD-307.9) MYASTHENIA GRAVIS WITHOUT EXACERBATION, OCULAR (ICD-358.00) CARPAL TUNNEL SYNDROME (DR KERNODLE) (ICD-354.0) ANEMIA, IRON DEFICIENCY (ICD-280.9) HYPOTHYROIDISM (ICD-244.9) ALLERGY, IMMUNOTHERAPY (ICD-995.3) ALLERGIC RHINITIS (ICD-477.9) FIBROMYALGIA (ICD-729.1) RHEUMATOID ARTHRITIS  (DR KERNODLE) (ICD-714.0) OSTEOARTHRITIS   (DR ZIEMINSKI/DR Gavin Potters) (ICD-715.90) OSTEOARTHROSIS, GENERALIZED, UNSPC SITE DR ZIEMINSKI (ICD-715.00) DIARRHEA (ICD-787.91) CONSTIPATION, MILD (ICD-564.00) GASTRITIS / BARRETTS/STRICTURE  (ICD-535.50) GASTRIC ULCER, ACUTE (ICD-531.30) BLOOD IN STOOL VIA STOOL CARDS (ICD-578.1) BIPOLAR AFFECTIVE DISORDER, DEPRESSED, HX OF (ICD-V11.8) 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 at [facility]

## 2010-06-29 NOTE — Progress Notes (Signed)
Summary: DUKES MOUTHWASH  Phone Note Refill Request Message from:  Patient on April 06, 2010 9:33 AM  Refills Requested: Medication #1:  FIRST-DUKES MOUTHWASH  SUSP 15 mL.  Swish and spit every 6 hours as needed. pt going out of town and per pt, everytime she go out of town she gets " a mouthful of sores" is this ok to fill?   Method Requested: Electronic Initial call taken by: Mervin Hack CMA [facility]),  April 06, 2010 9:33 AM  Follow-up for Phone Call        okay to fill.   6oz, same sig as before. 1rf.  Follow-up by: Crawford Givens MD,  April 06, 2010 9:51 AM  Additional Follow-up for Phone Call Additional follow up Details #1::        Medication phoned to pharmacy. Patient Advised.  Additional Follow-up by: Delilah Shan CMA [facility]),  April 06, 2010 11:05 AM

## 2010-06-29 NOTE — Letter (Signed)
Summary: Results Follow up Letter  [place] at [facility]  [address] [place], [place] [address]   Phone: [phone]  Fax: [phone]    04/13/2010 MRN: [id number]    [facility] [address] [place], [place]  [address]    Dear Olivia [name],  The following are the results of your recent test(s):  Test         Result    Pap Smear:        Normal _____  Not Normal _____ Comments: ______________________________________________________ Cholesterol: LDL(Bad cholesterol):         Your goal is less than:         HDL (Good cholesterol):       Your goal is more than: Comments:  ______________________________________________________ Mammogram:        Normal _____  Not Normal _____ Comments:  ___________________________________________________________________ Hemoccult:        Normal __X___  Not normal _______ Comments:  Yearly follow up is recommended.   _____________________________________________________________________ Other Tests:    We routinely do not discuss normal results over the telephone.  If you desire a copy of the results, or you have any questions about this information we can discuss them at your next office visit.   Sincerely,    Dwana Curd. Para March, M.D.  [facility]

## 2010-06-29 NOTE — Progress Notes (Signed)
Summary: tramadol  Phone Note Refill Request Message from:  Fax from Pharmacy on June 20, 2009 11:36 AM  Refills Requested: Medication #1:  TRAMADOL HCL 50 MG  TABS 1 by mouth 1 DAILY   Supply Requested: 1 month   Last Refilled: 05/08/2009 [facility] [id number]   Method Requested: Electronic Initial call taken by: Benny Lennert CMA [facility]),  June 20, 2009 11:36 AM  Follow-up for Phone Call        Rx called to pharmacy Follow-up by: Benny Lennert CMA [facility]),  June 20, 2009 2:49 PM    Prescriptions: TRAMADOL HCL 50 MG  TABS (TRAMADOL HCL) 1 by mouth 1 DAILY  #30 x 2   Entered and Authorized by:   Shaune Leeks MD   Signed by:   Shaune Leeks MD on 06/20/2009   Method used:   Telephoned to ...       [facility] (retail)       [address]       [place], [place]  [address]       Ph: [phone]       Fax: [phone]   RxID:   [id number]

## 2010-06-29 NOTE — Assessment & Plan Note (Signed)
Summary: URI/ lb   Vital Signs:  Patient profile:   70 year old female Weight:      177.25 pounds Temp:     99.0 degrees F oral Pulse rate:   76 / minute Pulse rhythm:   regular BP sitting:   118 / 78  (left arm) Cuff size:   large  Vitals Entered By: Sydell Axon LPN (May 18, 2010 10:41 AM) CC: Runny nose and sneezing, has not felt good since the first of November   History of Present Illness: Pt here for acute visit for URI with rhinitis and sneezing. "I am lousy." She had ST and dyspnea early Nov. Had just about licked this but started rhinitis and runny eyes and sneezing yesterday. [place] with her family, one older lady on chemo and one on end stage meds, both on  O2 due to prior smoking.  She has rash and sores under the arms...had botox for the sweating. The lesions had just about healed using aloe from a plant she has and then used aloe from the pharmacy. It was just about healed until she wore a different blouse last week. She has seen two dermatologists. She was sent from Dr Orson Aloe to Dr Gwen Pounds to Dr Dorita Sciara PA "who laughed at her"  She has been distressed. Complaints today:  No fever but some chills, headache of significance with nasal pain from new glasses that are heavier, no ear pain but roaring, rhinitis that copiously runs clear water, no ST but scratchy and dry, cough that is less than before, nonproductive yet. She has been taking Guiafenesin. Last antibiotics a while ago.  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.00) 13)  Carpal Tunnel Syndrome (DR KERNODLE)  (ICD-354.0) 14)  Anemia, Iron Deficiency  (ICD-280.9) 15)  Hypothyroidism  (ICD-244.9) 16)  Allergy, Immunotherapy  (ICD-995.3) 17)  Allergic Rhinitis  (ICD-477.9) 18)  Fibromyalgia  (ICD-729.1) 19)  Rheumatoid Arthritis (DR KERNODLE)  (ICD-714.0) 20)  Osteoarthritis (DR ZIEMINSKI/DR Gavin Potters)  (ICD-715.90) 21)  Osteoarthrosis, Generalized, Unspc Site Dr Jimmy Footman  (ICD-715.00) 22)  Diarrhea  (ICD-787.91) 23)  Constipation, Mild  (ICD-564.00) 24)  Gastritis / Barretts/stricture  (ICD-535.50) 25)  Gastric Ulcer, Acute  (ICD-531.30) 26)  Blood in Stool Via Stool Cards  (ICD-578.1) 27)  Screening For Malignannt Neoplasm, Site Nec  (ICD-V76.49) 28)  Bipolar Affective Disorder, Depressed, Hx of  (ICD-V11.8)  Medications Prior to Update: 1)  Levoxyl 50 Mcg Tabs (Levothyroxine Sodium) .Marland Kitchen.. 1 Tablet By Mouth Once A Day 2)  Tramadol Hcl 50 Mg  Tabs (Tramadol Hcl) .Marland Kitchen.. 1 By Mouth 1 Daily 3)  Xanax Xr 2 Mg  Tb24 (Alprazolam) .Marland Kitchen.. 1 Tablet Two  Times A Day 4)  Legatrin Pm .Marland KitchenMarland Kitchen. 1 At Bedtime 5)  Nexium 40 Mg Cpdr (Esomeprazole Magnesium) .Marland Kitchen.. 1 Daily By Mouth 45 Mins Before Brkfst. and At Bedtime As Needed 6)  Greenmint Mouthwash  Liqd (Mouthwashes) 7)  Toprol Xl 25 Mg Xr24h-Tab (Metoprolol Succinate) .Marland Kitchen.. 11/2 Tabs  By Mouth  At Night 8)  Sandoz .... One Scoop Two Times A Day 9)  Patanol 0.1 % Soln (Olopatadine Hcl) .... One Drop Each Eye Once A Day As Needed 10)  First-Dukes Mouthwash  Susp (Diphenhyd-Hydrocort-Nystatin) .Marland Kitchen.. 15 Ml.  Swish and Spit Every 6 Hours As Needed. 11)  Citalopram Hydrobromide 40 Mg Tabs (Citalopram Hydrobromide) .... Take 1 Tablet By Mouth Once A Day 12)  Omeprazole 20 Mg Cpdr (Omeprazole) .... Take 1 Tablet By Mouth Two Times A Day 13)  Zyrtec Allergy 10 Mg Tabs (Cetirizine Hcl) .... Take 1 Tablet By Mouth Once A Day 14)  Fish Oil   Oil (Fish Oil) .... 1,000 Mg. 2 Tabs By Mouth  Two Times A Day 15)  Vitamin B Complex-C   Caps (B Complex-C) .... Take 1 Capsule By Mouth Once A Day 16)  Calcium Carbonate-Vitamin D 600-400 Mg-Unit  Tabs (Calcium Carbonate-Vitamin D) .... Take 1 Tablet By Mouth Once A Day 17)  L-Arginine 500 Mg Caps (Arginine) .... Take 1 Capsule By Mouth Once A Day 18)  Multivitamins   Tabs (Multiple Vitamin) .... Take 1 Tablet By Mouth Once A Day 19)  Vitamin D 1000 Unit  Tabs (Cholecalciferol) .... Take 1 Tablet By Mouth Once A Day 20)  Ferrous Sulfate 325 (65 Fe) Mg  Tabs (Ferrous Sulfate) .... Take 1 Tablet By Mouth Once A Day 21)  Vitamin B-12 500 Mcg  Tabs (Cyanocobalamin) .... 1,000 Micrograms. Once Daily 22)  Triamcinolone Acetonide 0.1 % Crea (Triamcinolone Acetonide) .... Aaa Three Times A Day As Needed For Itching 23)  Mirtazapine 30 Mg Tabs (Mirtazapine) .... Take 1 Tab By Mouth At Bedtime 24)  Hydroxyzine Hcl 25 Mg Tabs (Hydroxyzine Hcl) .... Take 1 or 2 Four Times A Day As Needed For Itching.  Allergies: 1)  ! * Mestinon 2)  ! * Demerol 3)  ! Drysol (Aluminum Chloride)  Physical Exam  General:  Well-developed,well-nourished,in no acute distress; alert,appropriate and cooperative throughout examination, congested and slightly puffy. Head:  Normocephalic and atraumatic without obvious abnormalities. No apparent alopecia or balding. Sinuses tender over maxillary area bilat.. Eyes:  Conjunctiva clear bilaterally. Mildly generally inflamed, presumably  from allergies Ears:  External ear exam shows no significant lesions or deformities.  Otoscopic examination reveals clear canals, tympanic membranes are intact bilaterally without bulging, retraction, inflammation or discharge. Hearing is grossly normal bilaterally. Nose:  External nasal examination shows no deformity or inflammation. Nasal mucosa are pink and moist without lesions or exudates but swollen and boggy with clear discharge. Mouth:  Oral mucosa and oropharynx without lesions or  exudates.  Teeth in good repair. White thick PND. Neck:  No deformities, masses, or tenderness noted. Lungs:  Normal respiratory effort, chest expands symmetrically. Lungs are clear to auscultation, no crackles or wheezes. Heart:  Normal rate and regular rhythm. S1 and S2 normal without gallop, murmur, click, rub or other extra sounds.   Impression & Recommendations:  Problem # 1:  URI (ICD-465.9) Assessment Unchanged  Prolonged...see instructions. Her updated medication list for this problem includes:    Zyrtec Allergy 10 Mg Tabs (Cetirizine hcl) .Marland Kitchen... Take 1 tablet by mouth once a day  Instructed on symptomatic treatment. Call if symptoms persist or worsen.   Her updated medication list for this problem includes:    Zyrtec Allergy 10 Mg Tabs (Cetirizine hcl) .Marland Kitchen... Take 1 tablet by mouth once a day  Problem # 2:  PAIN IN SOFT TISSUES OF LIMB (ICD-729.5) Assessment: Unchanged Have pt seen again at  Dr Philemon Kingdom office for followup of rash and botox injections.,  Complete Medication List: 1)  Levoxyl 50 Mcg Tabs (Levothyroxine sodium) .Marland Kitchen.. 1 tablet by mouth once a day 2)  Tramadol Hcl 50 Mg Tabs (Tramadol hcl) .Marland Kitchen.. 1 by mouth 1 daily 3)  Xanax Xr 2 Mg Tb24 (Alprazolam) .Marland Kitchen.. 1 tablet two  times a day 4)  Legatrin Pm  .Marland Kitchen.. 1 at bedtime 5)  Nexium 40 Mg Cpdr (Esomeprazole magnesium) .Marland Kitchen.. 1 daily by mouth 45 mins before brkfst. and at bedtime as needed 6)  Greenmint Mouthwash Liqd (Mouthwashes) 7)  Toprol Xl 25 Mg Xr24h-tab (Metoprolol succinate) .Marland Kitchen.. 11/2 tabs  by mouth at night 8)  Sandoz  .... One scoop two times a day 9)  Patanol 0.1 % Soln (Olopatadine hcl) .... One drop each eye once a day as needed 10)  First-dukes Mouthwash Susp (Diphenhyd-hydrocort-nystatin) .Marland Kitchen.. 15 ml.  swish and spit every 6 hours as needed. 11)  Citalopram Hydrobromide 40 Mg Tabs (Citalopram hydrobromide) .... Take 1 tablet by mouth once a day 12)  Omeprazole 20 Mg Cpdr (Omeprazole) .... Take 1 tablet by  mouth two times a day 13)  Zyrtec Allergy 10 Mg Tabs (Cetirizine hcl) .... Take 1 tablet by mouth once a day 14)  Fish Oil Oil (Fish oil) .... 1,000 mg. 2 tabs by mouth two times a day 15)  Vitamin B Complex-c Caps (B complex-c) .... Take 1 capsule by mouth once a day 16)  Calcium Carbonate-vitamin D 600-400 Mg-unit Tabs (Calcium carbonate-vitamin d) .... Take 1 tablet by mouth once a day 17)  L-arginine 500 Mg Caps (Arginine) .... Take 1 capsule by mouth once a day 18)  Multivitamins Tabs (Multiple vitamin) .... Take 1 tablet by mouth once a day 19)  Vitamin D 1000 Unit Tabs (Cholecalciferol) .... Take 1 tablet by mouth once a day 20)  Ferrous Sulfate 325 (65 Fe) Mg Tabs (Ferrous sulfate) .... Take 1 tablet by mouth once a day 21)  Vitamin B-12 500 Mcg Tabs (Cyanocobalamin) .... 1,000 micrograms. once daily 22)  Triamcinolone Acetonide 0.1 % Crea (Triamcinolone acetonide) .... Aaa three times a day as needed for itching 23)  Mirtazapine 30 Mg Tabs (Mirtazapine) .... Take 1 tab by mouth at bedtime 24)  Hydroxyzine Hcl 25 Mg Tabs (Hydroxyzine hcl) .... Take 1 or 2 four times a day as needed for itching. 25)  Zithromax Z-pak 250 Mg Tabs (Azithromycin) .... As dir  Patient Instructions: 1)  Pls try to get appt with Dr Gwen Pounds today or next week.  2)  Start Zithromax. 3)  Cont taking  Guaifenesin by going to [facility] or [facility] and getting MUCOUS RELIEF EXPECTORANT (400mg ), take 11/2 tabs by mouth AM and NOON. 4)  Drink lots of fluids anytime taking Guaifenesin.  5)  Cont Tyl ES 2 tabs by mouth three times a day for 4-5 days. 6)  Take Delsym per label at night for cough. Prescriptions: ZITHROMAX Z-PAK 250 MG TABS (AZITHROMYCIN) as dir  #1 pak x 0   Entered and Authorized by:   Shaune Leeks MD   Signed by:   Shaune Leeks MD on 05/18/2010   Method used:   Electronically to        [facility] (retail)       [address]       [place], [place]  [address]  Ph: [phone]       Fax: [phone]   RxID:   [id number]      Current Allergies (reviewed today): ! * MESTINON ! * DEMEROL ! DRYSOL (ALUMINUM CHLORIDE)

## 2010-06-29 NOTE — Progress Notes (Signed)
Summary: pt requests phone call  Phone Note Call from Patient Call back at Home Phone [phone]   Caller: Patient Call For: Crawford Givens MD Reason for Call: Insurance Question Complaint: Cough/Sore throat Summary of Call: Pt states that she has extreme pain under her arms, the rash is worse- weeping and running down her sides.  She is asking that you please call her. Initial call taken by: Lowella Petties CMA,  March 13, 2010 9:14 AM  Follow-up for Phone Call        Pt says she is having problems breathing- short of breath since last night.  Says she has been using inhalers without relief.  This is getting worse.  She is not going to go to ER, says they wont help her.  Follow-up by: Lowella Petties CMA,  March 13, 2010 4:31 PM  Additional Follow-up for Phone Call Additional follow up Details #1::        I told patient that if she was really SOB, then she needed to be at ER.  It is unclear if she is going to go.  If she needs to be seen for her other complaints, she can call back in the AM to get on the schedule for tomorrow.  She'll need a appointment.  Please check with scheduling about this in the AM.  Additional Follow-up by: Crawford Givens MD,  March 13, 2010 5:42 PM    Additional Follow-up for Phone Call Additional follow up Details #2::    Patient is better today, doesn't feel that she needs the appt.  Is using Aloe Plant and Aloe Spray and it is making it some better.  She is still convinced that the rash is shingles but it is under both arms.  Delilah Shan CMA Duncan Dull)  March 14, 2010 12:34 PM

## 2010-06-29 NOTE — Progress Notes (Signed)
Summary: refill request for patanol drops  Phone Note Refill Request Message from:  Fax from Pharmacy  Refills Requested: Medication #1:  PATANOL 0.1 % SOLN one drop each eye once a day..   Last Refilled: 08/24/2009 Faxed request from [facility].  Initial call taken by: Lowella Petties CMA,  Oct 18, 2009 12:04 PM    Prescriptions: PATANOL 0.1 % SOLN (OLOPATADINE HCL) one drop each eye once a day.  #1 bottle x 0   Entered and Authorized by:   Shaune Leeks MD   Signed by:   Shaune Leeks MD on 10/18/2009   Method used:   Electronically to        [facility] (retail)       [address]       [place], [place]  [address]       Ph: [phone]       Fax: [phone]   RxID:   [id number]

## 2010-06-29 NOTE — Progress Notes (Signed)
Summary: refill request for tramadol  Phone Note Refill Request Message from:  Fax from Pharmacy  Refills Requested: Medication #1:  TRAMADOL HCL 50 MG  TABS 1 by mouth 1 DAILY   Last Refilled: 02/13/2010 Faxed request from [facility], [phone]   Initial call taken by: Lowella Petties CMA,  March 15, 2010 11:03 AM  Follow-up for Phone Call        please call in.  Follow-up by: Crawford Givens MD,  March 15, 2010 11:23 AM  Additional Follow-up for Phone Call Additional follow up Details #1::        Medication phoned to pharmacy.  Additional Follow-up by: Delilah Shan CMA Duncan Dull),  March 15, 2010 1:45 PM    Prescriptions: TRAMADOL HCL 50 MG  TABS (TRAMADOL HCL) 1 by mouth 1 DAILY  #30 x 2   Entered and Authorized by:   Crawford Givens MD   Signed by:   Crawford Givens MD on 03/15/2010   Method used:   Telephoned to ...       [facility] (retail)       [address]       [place], [place]  [address]       Ph: [phone]       Fax: [phone]   RxID:   [id number]

## 2010-06-29 NOTE — Assessment & Plan Note (Signed)
Summary: F/U DLO   Vital Signs:  Patient profile:   70 year old female Weight:      179 pounds Temp:     98.1 degrees F oral Pulse rate:   84 / minute Pulse rhythm:   regular BP sitting:   140 / 94  (left arm) Cuff size:   large  Vitals Entered By: Sydell Axon LPN (July 12, 2009 2:36 PM) CC: Follow-up on BP   History of Present Illness: Pt here for BP followup. She got a digital BP cuff and her recordings are all elevated...some as high as 190/90s.  Her eyes are red today from crying...she had to put her cat to sleep yesterday.  She has also finished her Physical Therapy. She is better but still has minor difficulty withbalance and cannot yet totally do the exercises. She also feels she may have an early UTI, urine is cloudy with odor.  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)  (ICD-354.0) 11)  Anemia, Iron Deficiency  (ICD-280.9) 12)  Hypothyroidism  (ICD-244.9) 13)  Allergy, Immunotherapy  (ICD-995.3) 14)  Allergic Rhinitis  (ICD-477.9) 15)  Fibromyalgia  (ICD-729.1) 16)  Rheumatoid Arthritis (DR KERNODLE)  (ICD-714.0) 17)  Osteoarthritis (DR ZIEMINSKI/DR Gavin Potters)  (ICD-715.90) 18)  Osteoarthrosis, Generalized, Unspc Site Dr Jimmy Footman  (ICD-715.00) 19)  Diarrhea  (ICD-787.91) 20)  Constipation, Mild  (ICD-564.00) 21)  Gastritis / Barretts/stricture  (ICD-535.50) 22)  Gastric Ulcer, Acute  (ICD-531.30) 23)  Blood in Stool Via Stool Cards  (ICD-578.1) 24)  Screening For Malignannt Neoplasm, Site Nec  (ICD-V76.49) 25)  Bipolar Affective Disorder, Depressed, Hx of  (ICD-V11.8)  Medications Prior to Update: 1)   Levoxyl 50 Mcg Tabs (Levothyroxine Sodium) .Marland Kitchen.. 1 Tablet By Mouth Once A Day 2)  Tramadol Hcl 50 Mg  Tabs (Tramadol Hcl) .Marland Kitchen.. 1 By Mouth 1 Daily 3)  Xanax Xr 2 Mg  Tb24 (Alprazolam) .Marland Kitchen.. 1 Tablet Three Times A Day By Mouth 4)  Citalopram Hydrobromide 20 Mg  Tabs (Citalopram Hydrobromide) .Marland Kitchen.. 1 Daily By Mouth 5)  Legatrin Pm .Marland KitchenMarland Kitchen. 1 At Bedtime 6)  Nexium 40 Mg Cpdr (Esomeprazole Magnesium) .Marland Kitchen.. 1 Daily By Mouth 45 Mins Before Brkfst. and At Bedtime 7)  Mirtazapine 15 Mg Tabs (Mirtazapine) .... 8 Pm Daily By Mouth 8)  Cholestyramine Light 4 Gm/dose Powd (Cholestyramine Light) .... Two Times A Day 9)  Tessalon 200 Mg Caps (Benzonatate) .... One Tab By Mouth Three Times A Day, Last Dose Being One Hour Prior To Bed 10)  Greenmint Mouthwash  Liqd (Mouthwashes)  Allergies: 1)  ! * Mestinon 2)  ! * Demerol  Physical Exam  General:  Well-developed,well-nourished,in no acute distress; alert,appropriate and cooperative throughout examination, minimally congested. Head:  Normocephalic and atraumatic without obvious abnormalities. No apparent alopecia or balding. Sinuses NT. Eyes:  Conjunctiva clear bilaterally. Mildly generally inflamed from crying. Ears:  External ear exam shows no significant lesions or deformities.  Otoscopic examination reveals clear canals, tympanic membranes are intact bilaterally without bulging, retraction, inflammation or discharge. Hearing is grossly normal bilaterally. Nose:  External nasal examination shows no deformity or inflammation. Nasal mucosa are pink and moist without lesions  or exudates. Mouth:  Oral mucosa and oropharynx without lesions or exudates.  Teeth in good repair. Neck:  No deformities, masses, or tenderness noted. Lungs:  Normal respiratory effort, chest expands symmetrically. Lungs are clear to auscultation, no crackles or wheezes. Heart:  Normal rate and regular rhythm. S1 and S2 normal without gallop, murmur, click, rub or other extra sounds. Abdomen:   No suprapubic tenderness Msk:  No CVAT.   Impression & Recommendations:  Problem # 1:  HYPERTENSION (ICD-401.9) Assessment Unchanged Start medication. Recheck. Her updated medication list for this problem includes:    Toprol Xl 25 Mg Xr24h-tab (Metoprolol succinate) ..... One tab by mouth at night  BP today: 140/94 Prior BP: 142/100 (05/30/2009)  Labs Reviewed: K+: 4.5 (04/05/2009) Creat: : 0.7 (04/05/2009)   Chol: 262 (07/23/2008)   HDL: 48.0 (07/23/2008)   LDL: DEL (07/23/2008)   TG: 271 (07/23/2008)  Problem # 2:  DYSURIA (ICD-788.1) Assessment: New  U/a Micro looks ok. Will send culture. Push fluids. Orders: UA Dipstick W/ Micro (manual) ([address]) Specimen Handling ([address]) T-Culture, Urine ([id number])  Encouraged to push clear liquids, get enough rest, and take acetaminophen as needed. To be seen in 10 days if no improvement, sooner if worse.  Problem # 3:  DIZZINESS/ BALANCE PROBLEMS (ICD-780.4) Assessment: Improved  Continue exercises learned in PT.  Problem # 4:  OSTEOARTHRITIS   (DR ZIEMINSKI/DR Gavin Potters) (ICD-715.90) Assessment: Unchanged  Hands cont to be involved. Use Tyl regularly. Her updated medication list for this problem includes:    Tramadol Hcl 50 Mg Tabs (Tramadol hcl) .Marland Kitchen... 1 by mouth 1 daily  Discussed use of medications, application of heat or cold, and exercises.   Complete Medication List: 1)  Levoxyl 50 Mcg Tabs (Levothyroxine sodium) .Marland Kitchen.. 1 tablet by mouth once a day 2)  Tramadol Hcl 50 Mg Tabs (Tramadol hcl) .Marland Kitchen.. 1 by mouth 1 daily 3)  Xanax Xr 2 Mg Tb24 (Alprazolam) .Marland Kitchen.. 1 tablet three times a day by mouth 4)  Citalopram Hydrobromide 20 Mg Tabs (Citalopram hydrobromide) .Marland Kitchen.. 1 daily by mouth 5)  Legatrin Pm  .Marland Kitchen.. 1 at bedtime 6)  Nexium 40 Mg Cpdr (Esomeprazole magnesium) .Marland Kitchen.. 1 daily by mouth 45 mins before brkfst. and at bedtime 7)  Mirtazapine 15 Mg Tabs (Mirtazapine) .... 8 pm daily by mouth 8)  Cholestyramine Light 4 Gm/dose Powd  (Cholestyramine light) .... Two times a day 9)  Tessalon 200 Mg Caps (Benzonatate) .... One tab by mouth three times a day, last dose being one hour prior to bed 10)  Greenmint Mouthwash Liqd (Mouthwashes) 11)  Toprol Xl 25 Mg Xr24h-tab (Metoprolol succinate) .... One tab by mouth at night  Patient Instructions: 1)  RTC 6 weeks for BP check Prescriptions: TOPROL XL 25 MG XR24H-TAB (METOPROLOL SUCCINATE) one tab by mouth at night  #30 x 12   Entered and Authorized by:   Shaune Leeks MD   Signed by:   Shaune Leeks MD on 07/12/2009   Method used:   Electronically to        [facility] (retail)       [address]       [place], [place]  [address]       Ph: [phone]       Fax: [phone]   RxID:   [id number]   Current Allergies (reviewed today): ! * MESTINON ! * DEMEROL  Laboratory Results   Urine Tests  Date/Time Received: July 12, 2009 3:23 PM  Date/Time Reported: July 12, 2009 3:23 PM   Routine Urinalysis   Color: yellow Appearance: Clear Glucose: negative   (Normal Range: Negative) Bilirubin: negative   (Normal Range: Negative) Ketone: negative   (Normal Range: Negative) Spec. Gravity: 1.010   (Normal Range: 1.003-1.035) Blood: negative   (Normal Range: Negative) pH: 5.0   (Normal Range: 5.0-8.0) Protein: negative   (Normal Range: Negative) Urobilinogen: 0.2   (Normal Range: 0-1) Nitrite: negative   (Normal Range: Negative) Leukocyte Esterace: small   (Normal Range: Negative)        Appended Document: F/U DLO U/a Micro  0-1 WBCs  0 RBCs   0 Bact   Rare Epis
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
## 2010-06-29 NOTE — Letter (Signed)
Summary: Dr.Paul Marcom,[facility] Results Note  Dr.Paul Marcom,[facility] Results Note   Imported By: Beau Fanny 11/07/2009 13:53:05  _____________________________________________________________________  External Attachment:    Type:   Image     Comment:   External Document

## 2010-06-29 NOTE — Progress Notes (Signed)
Summary: pt wants list of meds used for sweating  Phone Note Call from Patient Call back at Home Phone [phone] Call back at [phone]   Caller: Patient Call For: Shaune Leeks MD Summary of Call: Pt states she has profuse sweating under her arms.  She is asking for a list of meds that you have given  her for this through the years.  Paper chart is on your shelf.  Please mail to pt when ready. Initial call taken by: Lowella Petties CMA,  November 16, 2009 3:15 PM  Follow-up for Phone Call        Discussed the fact that I have no idea of any trmt I have given her for hyperhydrosis.  Follow-up by: Shaune Leeks MD,  November 16, 2009 6:12 PM

## 2010-06-29 NOTE — Letter (Signed)
Summary: Schaller Retirement letter  [facility] [place]  [address]   [place], [place] [address]   Phone: [phone]  Fax: [phone]       12/29/2009 MRN: [id number]  Olivia [name] [address] [place], [place]  [address]  Dear [name],  [facility] [place], and [facility] announce the retirement of ROBERT N. SCHALLER, M.D., from full-time practice at the [place] office effective November 24, 2009 and his plans of returning part-time.  It is important to Dr. Schaller and to our practice that you understand that [facility] [place] has seven physicians in our office for your health care needs.  We will continue to offer the same exceptional care that you have today.    Dr. Schaller has spoken to many of you about his plans for retirement and returning part-time in the fall.   We will continue to work with you through the transition to schedule appointments for you in the office and meet the high standards that [facility] is committed to.   Again, it is with great pleasure that we share the news that Dr. Schaller will return to  [facility] [place] in October of 2011 with a reduced schedule.    If you have any questions, or would like to request an appointment with one of our physicians, please call us at [phone] and press the option for Scheduling an appointment.  We take pleasure in providing you with excellent patient care and look forward to seeing you at your next office visit.  Our [place] Physicians are:  Richard Letvak, M.D. Robert Schaller, M.D. Marne Tower, M.D. Amy Bedsole, M.D. Spencer Copland, M.D. Talia Aron, M.D. We proudly welcomed Shaw Duncan, M.D. and Javier Gutierrez, M.D. to the practice in July/August 2011.  Sincerely,   [facility] [place] 

## 2010-06-29 NOTE — Progress Notes (Signed)
Summary: regarding BRCA testing  Phone Note Call from Patient Call back at Home Phone [phone] Call back at [phone]   Caller: Patient Call For: Shaune Leeks MD Summary of Call: Pt is asking if she can have BRCA, one or two, testing done to determine if she has a gene for breast cancer.  She has a personal hx of breast cancer and several family members  have had, or now have, breast cancer.  She wants to know if she has the gene to better protect her daughters and grand daughter.  Her cousin and her cousins daughter are currently being treated in [place] and their doctor has recommended that the pt have this test.  Please advise.  Pt knows you are out till monday. Initial call taken by: Lowella Petties CMA,  September 02, 2009 1:00 PM  Follow-up for Phone Call        Spoke with pt. Our lab does not do breast cancer genetic counselling/testing. She has has=d her mastectiomies and reconstruction at [facility]. They have a breast cancer center which does genetic testing. Please get appt with [facility] for genetic testing.  [phone]. Follow-up by: Shaune Leeks MD,  September 05, 2009 9:08 AM  Additional Follow-up for Phone Call Additional follow up Details #1::        Leola GJ has contact with [facility]. We are waiting to hear back from them to see if they will take Georgianna as a patient. Additional Follow-up by: Carlton Adam,  September 08, 2009 3:56 PM  New Problems: BREAST CANCER (ICD-174.9)   Additional Follow-up for Phone Call Additional follow up Details #2::    Dean GJ would not see her as a patient. Called [facility]etics testing and they set her up an appt for 09/19/2009. They will evaluate for Risk and hopefully the testing which is not covered my Medicare will be paid for by a grant or something to that nature.  Follow-up by: Carlton Adam,  September 13, 2009 4:23 PM  New Problems: BREAST CANCER (ICD-174.9)

## 2010-06-29 NOTE — Progress Notes (Signed)
Summary: refill request for magic mouth wash  Phone Note Refill Request Message from:  Fax from Pharmacy  Refills Requested: Medication #1:  magic mouth wash   Last Refilled: 07/30/2009 Faxed request from [facility], this is not on med list. Dr. Lorenza Chick pt, form is on your desk.  Initial call taken by: Lowella Petties CMA,  November 18, 2009 12:07 PM  Follow-up for Phone Call        okay 240cc x 0 Follow-up by: Cindee Salt MD,  November 18, 2009 1:40 PM  Additional Follow-up for Phone Call Additional follow up Details #1::        Rx faxed to pharmacy Additional Follow-up by: DeShannon Smith CMA Duncan Dull),  November 18, 2009 3:14 PM    New/Updated Medications: FIRST-DUKES MOUTHWASH  SUSP (DIPHENHYD-HYDROCORT-NYSTATIN) use as directed Prescriptions: FIRST-DUKES MOUTHWASH  SUSP (DIPHENHYD-HYDROCORT-NYSTATIN) use as directed  #240cc x 0   Entered by:   Mervin Hack CMA (AAMA)   Authorized by:   Cindee Salt MD   Signed by:   Mervin Hack CMA (AAMA) on 11/18/2009   Method used:   Electronically to        [facility] (retail)       [address]       [place], [place]  [address]       Ph: [phone]       Fax: [phone]   RxID:   [id number]

## 2010-06-29 NOTE — Progress Notes (Signed)
Summary: patanol eye drops  Phone Note Call from Patient Call back at Home Phone [phone]   Caller: Patient Call For: Shaune Leeks MD Summary of Call: Patient was seen yesterday and she is calling requesting a rx for the patanol eye drops be sent to [facility]. She says that they received the toprol, but not the eye drops.  Initial call taken by: Melody Comas,  August 24, 2009 10:28 AM  Follow-up for Phone Call        Sorry to her...thought I did this. Now done. Follow-up by: Shaune Leeks MD,  August 24, 2009 12:24 PM  Additional Follow-up for Phone Call Additional follow up Details #1::        Patient notified. She said there is no need to apologize.  Additional Follow-up by: Melody Comas,  August 24, 2009 12:48 PM    Prescriptions: PATANOL 0.1 % SOLN (OLOPATADINE HCL) one drop each eye once a day.  #1 bottle x 0   Entered and Authorized by:   Shaune Leeks MD   Signed by:   Shaune Leeks MD on 08/24/2009   Method used:   Electronically to        [facility] (retail)       [address]       [place], [place]  [address]       Ph: [phone]       Fax: [phone]   RxID:   [id number]

## 2010-06-29 NOTE — Miscellaneous (Signed)
  Medications Added FIRST-DUKES MOUTHWASH  SUSP (DIPHENHYD-HYDROCORT-NYSTATIN) 15 mL.  Swish and spit every 6 hours as needed.       Clinical Lists Changes  Medications: Changed medication from FIRST-DUKES MOUTHWASH  SUSP (DIPHENHYD-HYDROCORT-NYSTATIN) use as directed to FIRST-DUKES MOUTHWASH  SUSP (DIPHENHYD-HYDROCORT-NYSTATIN) 15 mL.  Swish and spit every 6 hours as needed.

## 2010-06-29 NOTE — Letter (Signed)
Summary: [facility]  [facility]   Imported By: Lanelle Bal 02/24/2010 09:37:32  _____________________________________________________________________  External Attachment:    Type:   Image     Comment:   External Document

## 2010-06-29 NOTE — Assessment & Plan Note (Signed)
Summary: 6WK F/U FOR BP CHECK / LFW   Vital Signs:  Patient profile:   70 year old female Weight:      175.75 pounds Temp:     97.8 degrees F oral Pulse rate:   64 / minute Pulse rhythm:   regular BP sitting:   128 / 86  (left arm) Cuff size:   large  Vitals Entered By: Sydell Axon LPN (August 23, 2009 2:14 PM) CC: 6 Week follow-up on BP   History of Present Illness: Pt here for followup BP. She feels well and is doing fine. She is tolerating her Toprol well with no complaints. She has been decreased slightly on her Xanx, actually realistically takes it two times a day, very occasionally has a half tab in middle of the day.  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)  (ICD-354.0) 11)  Anemia, Iron Deficiency  (ICD-280.9) 12)  Hypothyroidism  (ICD-244.9) 13)  Allergy, Immunotherapy  (ICD-995.3) 14)  Allergic Rhinitis  (ICD-477.9) 15)  Fibromyalgia  (ICD-729.1) 16)  Rheumatoid Arthritis (DR KERNODLE)  (ICD-714.0) 17)  Osteoarthritis (DR ZIEMINSKI/DR Gavin Potters)  (ICD-715.90) 18)  Osteoarthrosis, Generalized, Unspc Site Dr Jimmy Footman  (ICD-715.00) 19)  Diarrhea  (ICD-787.91) 20)  Constipation, Mild  (ICD-564.00) 21)  Gastritis / Barretts/stricture  (ICD-535.50) 22)  Gastric Ulcer, Acute  (ICD-531.30) 23)  Blood in Stool Via Stool Cards  (ICD-578.1) 24)  Screening For Malignannt Neoplasm, Site Nec  (ICD-V76.49) 25)  Bipolar Affective Disorder, Depressed, Hx of  (ICD-V11.8)  Medications Prior to Update: 1)  Levoxyl 50 Mcg Tabs (Levothyroxine Sodium) .Marland Kitchen.. 1 Tablet By Mouth Once A Day 2)  Tramadol Hcl 50 Mg  Tabs (Tramadol Hcl)  .Marland Kitchen.. 1 By Mouth 1 Daily 3)  Xanax Xr 2 Mg  Tb24 (Alprazolam) .Marland Kitchen.. 1 Tablet Three Times A Day By Mouth 4)  Citalopram Hydrobromide 20 Mg  Tabs (Citalopram Hydrobromide) .Marland Kitchen.. 1 Daily By Mouth 5)  Legatrin Pm .Marland KitchenMarland Kitchen. 1 At Bedtime 6)  Nexium 40 Mg Cpdr (Esomeprazole Magnesium) .Marland Kitchen.. 1 Daily By Mouth 45 Mins Before Brkfst. and At Bedtime 7)  Mirtazapine 15 Mg Tabs (Mirtazapine) .... 8 Pm Daily By Mouth 8)  Cholestyramine Light 4 Gm/dose Powd (Cholestyramine Light) .... Two Times A Day 9)  Tessalon 200 Mg Caps (Benzonatate) .... One Tab By Mouth Three Times A Day, Last Dose Being One Hour Prior To Bed 10)  Greenmint Mouthwash  Liqd (Mouthwashes) 11)  Toprol Xl 25 Mg Xr24h-Tab (Metoprolol Succinate) .... One Tab By Mouth At Night  Allergies: 1)  ! * Mestinon 2)  ! * Demerol  Physical Exam  General:  Well-developed,well-nourished,in no acute distress; alert,appropriate and cooperative throughout examination, minimally congested. Head:  Normocephalic and atraumatic without obvious abnormalities. No apparent alopecia or balding. Sinuses NT. Eyes:  Conjunctiva clear bilaterally. Mildly generally inflamed from allergies. Lungs:  Normal respiratory effort, chest expands symmetrically. Lungs are clear to auscultation, no crackles or wheezes. Heart:  Normal rate and regular rhythm. S1 and S2 normal without gallop, murmur, click, rub or other extra sounds.   Impression & Recommendations:  Problem # 1:  HYPERTENSION (ICD-401.9)  Will increase slightly as her  nos at home are higher. Bring her cuff next time to check here. Her updated medication list for this problem includes:    Toprol Xl 25 Mg Xr24h-tab (Metoprolol succinate) .Marland Kitchen... 11/2 tabs  by mouth at night  BP today: 128/86 Prior BP: 140/94 (07/12/2009)  Labs Reviewed: K+: 4.5 (04/05/2009) Creat: : 0.7 (04/05/2009)   Chol: 262 (07/23/2008)   HDL: 48.0 (07/23/2008)   LDL: DEL (07/23/2008)   TG: 271 (07/23/2008)  Orders: Prescription Created  Electronically [phone])  Problem # 2:  ALLERGY, IMMUNOTHERAPY (ICD-995.3) Assessment: Deteriorated Eyes significantly inflamed. Try Patanol.  See instructions...OTC antihist.  Complete Medication List: 1)  Levoxyl 50 Mcg Tabs (Levothyroxine sodium) .Marland Kitchen.. 1 tablet by mouth once a day 2)  Tramadol Hcl 50 Mg Tabs (Tramadol hcl) .Marland Kitchen.. 1 by mouth 1 daily 3)  Xanax Xr 2 Mg Tb24 (Alprazolam) .Marland Kitchen.. 1 tablet two  times a day with 1/2 in afternoon,by mouth 4)  Citalopram Hydrobromide 20 Mg Tabs (Citalopram hydrobromide) .Marland Kitchen.. 1 daily by mouth 5)  Legatrin Pm  .Marland Kitchen.. 1 at bedtime 6)  Nexium 40 Mg Cpdr (Esomeprazole magnesium) .Marland Kitchen.. 1 daily by mouth 45 mins before brkfst. and at bedtime 7)  Mirtazapine 15 Mg Tabs (Mirtazapine) .... 8 pm daily by mouth 8)  Cholestyramine Light 4 Gm/dose Powd (Cholestyramine light) .... Two times a day 9)  Tessalon 200 Mg Caps (Benzonatate) .... One tab by mouth three times a day, last dose being one hour prior to bed 10)  Greenmint Mouthwash Liqd (Mouthwashes) 11)  Toprol Xl 25 Mg Xr24h-tab (Metoprolol succinate) .Marland Kitchen.. 11/2 tabs  by mouth at night 12)  Sandoz  .... One scoop two times a day 13)  Patanol 0.1 % Soln (Olopatadine hcl) .... One drop each eye once a day.  Patient Instructions: 1)  RTC 6 weeks.  2)  Try Claritin Zyrtec or Allegra daily. 3)  Try Patanol drops for the eyes. 4)  Increase Toprol to 11/2 tabs by mouth. Prescriptions: TOPROL XL 25 MG XR24H-TAB (METOPROLOL SUCCINATE) 11/2 tabs  by mouth at night  #45 x 12   Entered and Authorized by:   Shaune Leeks MD   Signed by:   Shaune Leeks MD on 08/23/2009   Method used:   Electronically to        [facility] (retail)       [address]       [place], [place]  [address]       Ph: [phone]       Fax: [phone]   RxID:   [id number]   Current Allergies (reviewed today): ! * MESTINON ! * DEMEROL

## 2010-06-29 NOTE — Progress Notes (Signed)
Summary: triamcinolon  Phone Note Refill Request Message from:  Fax from Pharmacy on February 14, 2010 4:20 PM  Refills Requested: Medication #1:  TRIAMCINOLONE ACETONIDE 0.1 % CREA AAA three times a day as needed for itching.   Last Refilled: 02/07/2010 Refill request from [facility]. [phone]  Initial call taken by: Melody Comas,  February 14, 2010 4:20 PM  Follow-up for Phone Call        done.  Follow-up by: Crawford Givens MD,  February 15, 2010 11:48 AM    Prescriptions: TRIAMCINOLONE ACETONIDE 0.1 % CREA (TRIAMCINOLONE ACETONIDE) AAA three times a day as needed for itching  #15g x 1   Entered and Authorized by:   Crawford Givens MD   Signed by:   Crawford Givens MD on 02/15/2010   Method used:   Electronically to        [facility] (retail)       [address]       [place], [place]  [address]       Ph: [phone]       Fax: [phone]   RxID:   [id number]

## 2010-06-29 NOTE — Progress Notes (Signed)
Summary: rash is worse  Phone Note Call from Patient Call back at Home Phone [phone] Call back at [phone]   Caller: Patient Summary of Call: Pt was seen last week for rash under her arms, given steroid cream. She says this worked for about 3 days but now the rash is worse, painful.  She will run out of the cream today and she is asking if she should refill and continue this or try something else.  Uses [facility] Initial call taken by: Lowella Petties CMA,  February 14, 2010 8:56 AM  Follow-up for Phone Call        I would refill it and set her up with derm.  I would like their input.  If she'll ok the referral, I'd refer.   Follow-up by: Crawford Givens MD,  February 14, 2010 9:56 AM  Additional Follow-up for Phone Call Additional follow up Details #1::        Patient says she has already seen 2 dermatologists before seeing you.  Dr. Orson Aloe in [place] did all that he knew to do and has been treating her for years.  He sent her to Dr. Gwen Pounds who did Botox but she says it "back-fired".  Dr. Gwen Pounds wanted to set her up with the [facility] but the Pain Clinic never called to set up the appt.  Therefore, she came to you because it was getting so bad.  Lugene Fuquay CMA Duncan Dull)  February 14, 2010 10:21 AM     Additional Follow-up for Phone Call Additional follow up Details #2::    She could get a third opinion from derm (for consideration of higher potency topcial steroids) or contact Dr. Gwen Pounds about the pain clinic referral.  I would do one or the other.  I am hesitant to use higher potency steroids on the axilla w/o derm opinion   Follow-up by: Crawford Givens MD,  February 14, 2010 10:26 AM  Additional Follow-up for Phone Call Additional follow up Details #3:: Details for Additional Follow-up Action Taken: Patient Advised. Lugene Fuquay CMA Duncan Dull)  February 14, 2010 10:43 AM

## 2010-06-29 NOTE — Progress Notes (Signed)
  Phone Note Outgoing Call   Summary of Call: I called patient's pharmacy.  I also d/w Dr. Hetty Ely.  I asked pharmacy to see if they could compound a med that would be similar to dry-off.  They are going to check on this and then let me know.  Please let the patient know and I'll send word when I have more information.  Okay to call tomorrow.  Initial call taken by: Crawford Givens MD,  March 01, 2010 5:44 PM  Follow-up for Phone Call        Patient Advised.   Should she take a Zostavax Vaccine?  Her pharmacy can give it to her. Follow-up by: Delilah Shan CMA Bryce Cheever Dull),  March 02, 2010 9:23 AM  Additional Follow-up for Phone Call Additional follow up Details #1::        Yes.  Additional Follow-up by: Crawford Givens MD,  March 02, 2010 10:36 AM    Additional Follow-up for Phone Call Additional follow up Details #2::    Patient Advised.  Follow-up by: Delilah Shan CMA (AAMA),  March 02, 2010 11:04 AM

## 2010-06-29 NOTE — Progress Notes (Signed)
Summary: note from pharmacist  Phone Note From Pharmacy   Caller: [facility] Summary of Call: Note from pharacist at [facility] is on your desk, regarding zostavax and deodorant. Initial call taken by: Lowella Petties CMA,  March 03, 2010 4:27 PM  Follow-up for Phone Call        please fax hard copy back.  Follow-up by: Crawford Givens MD,  March 05, 2010 6:49 PM  Additional Follow-up for Phone Call Additional follow up Details #1::        Signed form faxed back to pharmcy as instructed. Additional Follow-up by: Sydell Axon LPN,  March 06, 2010 9:16 AM

## 2010-06-29 NOTE — Progress Notes (Signed)
Summary: refill request for tramadol  Phone Note Refill Request Message from:  Fax from Pharmacy  Refills Requested: Medication #1:  TRAMADOL HCL 50 MG  TABS 1 by mouth 1 DAILY   Last Refilled: 11/15/2009 Faxed request from [facility], [phone]   Initial call taken by: Lowella Petties CMA,  December 15, 2009 12:38 PM    Prescriptions: TRAMADOL HCL 50 MG  TABS (TRAMADOL HCL) 1 by mouth 1 DAILY  #30 x 2   Entered and Authorized by:   Crawford Givens MD   Signed by:   Crawford Givens MD on 12/15/2009   Method used:   Electronically to        [facility] (retail)       [address]       [place], [place]  [address]       Ph: [phone]       Fax: [phone]   RxID:   [id number]

## 2010-06-29 NOTE — Letter (Signed)
Summary: Pt's Daily Blood Pressure Readings-08/23/09-10/03/09  Pt's Daily Blood Pressure Readings-08/23/09-10/03/09   Imported By: Beau Fanny 10/06/2009 08:31:35  _____________________________________________________________________  External Attachment:    Type:   Image     Comment:   External Document  Appended Document: Pt's Daily Blood Pressure Readings-08/23/09-10/03/09 These numbers Hg high in Sys readings per comparing with our office nos.

## 2010-06-29 NOTE — Progress Notes (Signed)
Summary: wants referral to dermatologist  Phone Note Call from Patient Call back at Home Phone [phone] Call back at [phone]- dont leave message   Caller: Patient Call For: Dr. Para March Summary of Call: Pt wants referral to dermatologist, someone very good- maybe [facility] or [facility].  Wants something asap.  She will need her cream refilled until then. Initial call taken by: Lowella Petties CMA,  February 15, 2010 9:16 AM  Follow-up for Phone Call        referral ordered.  Follow-up by: Crawford Givens MD,  February 15, 2010 11:49 AM

## 2010-06-29 NOTE — Progress Notes (Signed)
Summary: records from derm  Phone Note Call from Patient   Caller: Patient Summary of Call: Pt wanted you to know that one of her dermatologist's office will be faxing her records, so that you will be more familiar with her problem. Initial call taken by: Lowella Petties CMA,  February 15, 2010 4:28 PM  Follow-up for Phone Call        noted.  will await fax.  Follow-up by: Crawford Givens MD,  February 15, 2010 5:15 PM

## 2010-06-29 NOTE — Progress Notes (Signed)
Summary: pt requests phone call  Phone Note Call from Patient Call back at Home Phone [phone] Call back at [phone]   Caller: Patient Call For: Dr. Hetty Ely Summary of Call: Pt is asking that you call her.  I advised her that she is Dr. Lianne Bushy pt now, but she still wants you to call. Initial call taken by: Lowella Petties CMA,  March 15, 2010 3:29 PM  Follow-up for Phone Call        Discussed with pt. She was laughed at at the Derm's office, reporetdly PA at Dr Lupton's. She now has visible rash and continued pain. She will Try to see Dr Gwen Pounds, who has done Botox on her, now that she has visible rash. Follow-up by: Shaune Leeks MD,  March 15, 2010 6:05 PM

## 2010-06-29 NOTE — Progress Notes (Signed)
Summary: Rx Tramadol  Phone Note Refill Request Call back at [phone] Message from:  [facility] on September 19, 2009 8:39 AM  Refills Requested: Medication #1:  TRAMADOL HCL 50 MG  TABS 1 by mouth 1 DAILY   Last Refilled: 08/17/2009 Received faxed refill request please advise.   Method Requested: Telephone to Pharmacy Initial call taken by: Linde Gillis CMA Duncan Dull),  September 19, 2009 8:39 AM  Follow-up for Phone Call        Rx called to pharmacy Follow-up by: Linde Gillis CMA Duncan Dull),  September 19, 2009 10:12 AM    Prescriptions: TRAMADOL HCL 50 MG  TABS (TRAMADOL HCL) 1 by mouth 1 DAILY  #30 x 2   Entered and Authorized by:   Shaune Leeks MD   Signed by:   Shaune Leeks MD on 09/19/2009   Method used:   Electronically to        [facility] (retail)       [address]       [place], [place]  [address]       Ph: [phone]       Fax: [phone]   RxID:   [id number]

## 2010-06-29 NOTE — Progress Notes (Signed)
Summary: refill request for magic mouthwash  Phone Note Refill Request Message from:  Fax from Pharmacy  Refills Requested: Medication #1:  FIRST-DUKES MOUTHWASH  SUSP use as directed.   Last Refilled: 11/18/2009 Faxed request from [facility], [phone]  Initial call taken by: Lowella Petties CMA,  December 28, 2009 9:33 AM  Follow-up for Phone Call        magic mouth wash 15ml swish and spit q6h as needed.  disp with 1rf.  thanks.  Follow-up by: Crawford Givens MD,  December 28, 2009 9:59 AM    Prescriptions: Vanna Scotland MOUTHWASH  SUSP (DIPHENHYD-HYDROCORT-NYSTATIN) use as directed  #300 mL x 1   Entered by:   Delilah Shan CMA (AAMA)   Authorized by:   Crawford Givens MD   Signed by:   Delilah Shan CMA (AAMA) on 12/28/2009   Method used:   Electronically to        [facility] (retail)       [address]       [place], [place]  [address]       Ph: [phone]       Fax: [phone]   RxID:   [id number]

## 2010-06-29 NOTE — Assessment & Plan Note (Signed)
Summary: BACK PAIN/MK   Vital Signs:  Patient profile:   70 year old female Weight:      181.75 pounds BMI:     31.31 Temp:     98.2 degrees F oral Pulse rate:   64 / minute Pulse rhythm:   regular BP sitting:   142 / 100  (left arm) Cuff size:   regular  Vitals Entered By: Linde Gillis CMA Duncan Dull) (May 30, 2009 11:05 AM) CC: back pain   History of Present Illness: Pt here for back pain. Her cough got better and [place] and was in the [place] and cough returned. She has some production of gray mucous.  She has had back pain since last Tues two weeks ago. She has to lay flat on her back and has used biofreeze and had ttock, right side. She has had lots of falls lately, all from poor balance.   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) 8)  Anemia, Iron Deficiency  (ICD-280.9) 9)  Hypothyroidism  (ICD-244.9) 10)  Allergy, Immunotherapy  (ICD-995.3) 11)  Allergic Rhinitis  (ICD-477.9) 12)  Fibromyalgia  (ICD-729.1) 13)  Rheumatoid Arthritis (DR KERNODLE)  (ICD-714.0) 14)  Osteoarthritis (DR ZIEMINSKI/DR Gavin Potters)  (ICD-715.90) 15)  Osteoarthrosis, Generalized, Unspc Site Dr Jimmy Footman  (ICD-715.00) 16)  Diarrhea  (ICD-787.91) 17)  Constipation, Mild  (ICD-564.00) 18)  Gastritis / Barretts/stricture  (ICD-535.50) 19)  Gastric Ulcer, Acute  (ICD-531.30) 20)  Blood in Stool Via Stool Cards  (ICD-578.1) 21)  Screening For Malignannt Neoplasm, Site Nec  (ICD-V76.49) 22)  Bipolar Affective Disorder, Depressed, Hx of  (ICD-V11.8)  Medications Prior to Update: 1)  Levoxyl 50 Mcg Tabs (Levothyroxine Sodium) .Marland Kitchen.. 1 Tablet By Mouth Once A Day 2)  Tramadol Hcl 50 Mg  Tabs (Tramadol Hcl) .Marland Kitchen.. 1 By Mouth 1 Daily 3)  Xanax Xr 2  Mg  Tb24 (Alprazolam) .Marland Kitchen.. 1 Tablet Three Times A Day By Mouth 4)  Citalopram Hydrobromide 20 Mg  Tabs (Citalopram Hydrobromide) .Marland Kitchen.. 1 Daily By Mouth 5)  Legatrin Pm .Marland KitchenMarland Kitchen. 1 At Bedtime 6)  Nexium 40 Mg Cpdr (Esomeprazole Magnesium) .Marland Kitchen.. 1 Daily By Mouth 45 Mins Before Brkfst. and At Bedtime 7)  Mirtazapine 15 Mg Tabs (Mirtazapine) .... 8 Pm Daily By Mouth 8)  Cholestyramine Light 4 Gm/dose Powd (Cholestyramine Light) .... Two Times A Day 9)  Tessalon 200 Mg Caps (Benzonatate) .... One Tab By Mouth Three Times A Day, Last Dose Being One Hour Prior To Bed  Allergies: 1)  ! * Mestinon 2)  ! * Demerol  Physical Exam  General:  Well-developed,well-nourished,in no acute distress; alert,appropriate and cooperative throughout examination, minimally congested. Head:  Normocephalic and atraumatic without obvious abnormalities. No apparent alopecia or balding. Sinuses NT. Eyes:  Conjunctiva clear bilaterally.  Ears:  External ear exam shows no significant lesions or deformities.  Otoscopic examination reveals clear canals, tympanic membranes are intact bilaterally without bulging, retraction, inflammation or discharge. Hearing is grossly normal bilaterally. Nose:  External nasal examination shows no deformity or inflammation. Nasal mucosa are pink and moist without lesions or exudates. Mouth:  Oral mucosa and oropharynx without lesions or exudates.  Teeth in good repair. Neck:  No deformities, masses, or tenderness noted. Msk:  Bruise on  lower right buttock and into the sciatic notch area. Gait nml. Skin:  Intact without suspicious lesions or rashes, bruise of the lower right buttock.   Impression & Recommendations:  Problem # 1:  DIZZINESS/ BALANCE PROBLEMS (ICD-780.4) Assessment Unchanged Will refer to PT for balance therapy. Pt unable to heel/ toe walk. Orders: Physical Therapy Referral (PT)  Problem # 2:  BACK PAIN, LUMBAR AND BUTTOCK PAIN (ICD-724.2) Assessment: New  Due to bruise of  buttock from fall with sciatic irritation from sciatic notch irritation.  See instructions. Her updated medication list for this problem includes:    Tramadol Hcl 50 Mg Tabs (Tramadol hcl) .Marland Kitchen... 1 by mouth 1 daily  Discussed use of moist heat or ice, modified activities, medications, and stretching/strengthening exercises. Back care instructions given. To be seen in 2 weeks if no improvement; sooner if worsening of symptoms.   Problem # 3:  HYPERTENSION (ICD-401.9) Assessment: Deteriorated Inxcreased with lots of tension via family. Will recheck i9n the future. BP today: 142/100 Prior BP: 130/84 (04/05/2009)  Labs Reviewed: K+: 4.5 (04/05/2009) Creat: : 0.7 (04/05/2009)   Chol: 262 (07/23/2008)   HDL: 48.0 (07/23/2008)   LDL: DEL (07/23/2008)   TG: 271 (07/23/2008)  Complete Medication List: 1)  Levoxyl 50 Mcg Tabs (Levothyroxine sodium) .Marland Kitchen.. 1 tablet by mouth once a day 2)  Tramadol Hcl 50 Mg Tabs (Tramadol hcl) .Marland Kitchen.. 1 by mouth 1 daily 3)  Xanax Xr 2 Mg Tb24 (Alprazolam) .Marland Kitchen.. 1 tablet three times a day by mouth 4)  Citalopram Hydrobromide 20 Mg Tabs (Citalopram hydrobromide) .Marland Kitchen.. 1 daily by mouth 5)  Legatrin Pm  .Marland Kitchen.. 1 at bedtime 6)  Nexium 40 Mg Cpdr (Esomeprazole magnesium) .Marland Kitchen.. 1 daily by mouth 45 mins before brkfst. and at bedtime 7)  Mirtazapine 15 Mg Tabs (Mirtazapine) .... 8 pm daily by mouth 8)  Cholestyramine Light 4 Gm/dose Powd (Cholestyramine light) .... Two times a day 9)  Tessalon 200 Mg Caps (Benzonatate) .... One tab by mouth three times a day, last dose being one hour prior to bed 10)  Greenmint Mouthwash Liqd (Mouthwashes)  Patient Instructions: 1)  Refer to PT for balance tng.  2)  For back pain, take Tyl ES 2 tabs three times a day regularly fotr one month. 3)  Use heat by shower in the AM , then apply heat two other times during the day, then ice ayt night for . 4)  Avoid sitting as much as poss. 5)  RTC in one month or so to recheck BP.  Current  Allergies (reviewed today): ! * MESTINON ! * DEMEROL

## 2010-06-29 NOTE — Assessment & Plan Note (Signed)
Summary: RASH UNDER ARMS   Vital Signs:  Patient profile:   70 year old female Height:      64 inches Weight:      185.75 pounds BMI:     32.00 Temp:     98 degrees F oral Pulse rate:   84 / minute Pulse rhythm:   regular BP sitting:   140 / 90  (left arm) Cuff size:   large  Vitals Entered By: Delilah Shan CMA  Dull) (February 07, 2010 12:30 PM) CC: Rash under arms   History of Present Illness: Had been using dry-off for excessive sweating.  Product is off the market now and now the patient has broken out in the axilla bilaterally.  Area is painful and irritated.  Inc pain with arm movement.  Staying out of the heat to decrease sweating.  Prev eval by derm and also had botox injections w/o sig relief.  No fevers.   Allergies: 1)  ! * Mestinon 2)  ! * Demerol  Review of Systems       See HPI.  Otherwise negative.    Physical Exam  General:  NAD. NCAT No LA in neck or along clavicle/in axilla R>L axillary irritation with mild blanching erythema   Impression & Recommendations:  Problem # 1:  RASH AND OTHER NONSPECIFIC SKIN ERUPTION (ICD-782.1) I would use TAC to decrease the irritation and then change over to OTC drying agents used as needed.  follow up if not improved. I don't suspect fungal infection and she has no fluctuant mass.  The following medications were removed from the medication list:    Desonide 0.05 % Crea (Desonide) .Marland Kitchen... As directed as needed Her updated medication list for this problem includes:    Triamcinolone Acetonide 0.1 % Crea (Triamcinolone acetonide) .Marland Kitchen... Aaa three times a day as needed for itching  Complete Medication List: 1)  Levoxyl 50 Mcg Tabs (Levothyroxine sodium) .Marland Kitchen.. 1 tablet by mouth once a day 2)  Tramadol Hcl 50 Mg Tabs (Tramadol hcl) .Marland Kitchen.. 1 by mouth 1 daily 3)  Xanax Xr 2 Mg Tb24 (Alprazolam) .Marland Kitchen.. 1 tablet two  times a day 4)  Legatrin Pm  .Marland Kitchen.. 1 at bedtime 5)  Nexium 40 Mg Cpdr (Esomeprazole magnesium) .Marland Kitchen.. 1 daily by mouth  45 mins before brkfst. and at bedtime as needed 6)  Mirtazapine 15 Mg Tabs (Mirtazapine) .... 8 pm daily by mouth 7)  Greenmint Mouthwash Liqd (Mouthwashes) 8)  Toprol Xl 25 Mg Xr24h-tab (Metoprolol succinate) .Marland Kitchen.. 11/2 tabs  by mouth at night 9)  Sandoz  .... One scoop two times a day 10)  Patanol 0.1 % Soln (Olopatadine hcl) .... One drop each eye once a day as needed 11)  First-dukes Mouthwash Susp (Diphenhyd-hydrocort-nystatin) .Marland Kitchen.. 15 ml.  swish and spit every 6 hours as needed. 12)  Citalopram Hydrobromide 40 Mg Tabs (Citalopram hydrobromide) .... Take 1 tablet by mouth once a day 13)  Omeprazole 20 Mg Cpdr (Omeprazole) .... Take 1 tablet by mouth two times a day 14)  Zyrtec Allergy 10 Mg Tabs (Cetirizine hcl) .... Take 1 tablet by mouth once a day 15)  Fish Oil Oil (Fish oil) .... 1,000 mg. once daily 16)  Vitamin B Complex-c Caps (B complex-c) .... Take 1 capsule by mouth once a day 17)  Calcium Carbonate-vitamin D 600-400 Mg-unit Tabs (Calcium carbonate-vitamin d) .... Take 1 tablet by mouth once a day 18)  L-arginine 500 Mg Caps (Arginine) .... Take 1 capsule by mouth once a  day 19)  Multivitamins Tabs (Multiple vitamin) .... Take 1 tablet by mouth once a day 20)  Vitamin D 1000 Unit Tabs (Cholecalciferol) .... Take 1 tablet by mouth once a day 21)  Ferrous Sulfate 325 (65 Fe) Mg Tabs (Ferrous sulfate) .... Take 1 tablet by mouth once a day 22)  Vitamin B-12 500 Mcg Tabs (Cyanocobalamin) .... 1,000 micrograms. once daily 23)  Triamcinolone Acetonide 0.1 % Crea (Triamcinolone acetonide) .... Aaa three times a day as needed for itching  Patient Instructions: 1)  Stop the desonide and use the triamcinolone three times a day.  Let us know if it isn't getting better.  Take care.  Prescriptions: TRIAMCINOLONE ACETONIDE 0.1 % CREA (TRIAMCINOLONE ACETONIDE) AAA three times a day as needed for itching  #15g x 1   Entered and Authorized by:   Crawford Givens MD   Signed by:   Crawford Givens MD  on 02/07/2010   Method used:   Electronically to        [facility] (retail)       [address]       [place], [place]  [address]       Ph: [phone]       Fax: [phone]   RxID:   [id number]   Current Allergies (reviewed today): ! * MESTINON ! * DEMEROL

## 2010-06-29 NOTE — Assessment & Plan Note (Signed)
Summary: F/U   Vital Signs:  Patient profile:   70 year old female Weight:      180 pounds Temp:     97.9 degrees F oral Pulse rate:   72 / minute Pulse rhythm:   regular BP sitting:   132 / 88  (left arm) Cuff size:   large  Vitals Entered By: Sydell Axon LPN (February 23, 2010 8:09 AM) CC: Rash under arms off and on. Saw Dr. Terri Piedra yesterday and was given a rx for cream and can not use   History of Present Illness: Saw derm yesterday and was told that it wasn't a derm problem.  She used drysol but it didn't help and caused more pain.  Dec in appetite noted.  "Nauseated since the botox."  "They told me I need a full endo workup."    "My face starts to hurt later in the day, around my mouth.  My whole body is on fire."    Axillary pain has been going on since April.  She does get some relief with topical TAC and z-sorb.  Still  with profuse sweating in axilla "with a terrible odor."    "I have a really acidic body."  Allergies: 1)  ! * Mestinon 2)  ! * Demerol 3)  ! Drysol (Aluminum Chloride)  Review of Systems       See HPI.  Otherwise negative.    Physical Exam  General:  NAD. NCAT, OP wnl on inspection No LA in neck or along clavicle/in axilla bilateral axillary irritation with mild blanching erythema, 1 patch on R axilla and 2 on L, all about 1.5 cm across.    Impression & Recommendations:  Problem # 1:  PAIN IN SOFT TISSUES OF LIMB (ICD-729.5) Axillary pain continues.  Prev with relief from Dri-Off but this was taken off the market.  I will check on derm notes and d/w pharm.  I told patient that I am unclear about her condition but I will check records and other sources.  I'll contact patient next week.  She understood.   Complete Medication List: 1)  Levoxyl 50 Mcg Tabs (Levothyroxine sodium) .Marland Kitchen.. 1 tablet by mouth once a day 2)  Tramadol Hcl 50 Mg Tabs (Tramadol hcl) .Marland Kitchen.. 1 by mouth 1 daily 3)  Xanax Xr 2 Mg Tb24 (Alprazolam) .Marland Kitchen.. 1 tablet two  times a  day 4)  Legatrin Pm  .Marland Kitchen.. 1 at bedtime 5)  Nexium 40 Mg Cpdr (Esomeprazole magnesium) .Marland Kitchen.. 1 daily by mouth 45 mins before brkfst. and at bedtime as needed 6)  Mirtazapine 15 Mg Tabs (Mirtazapine) .... 8 pm daily by mouth 7)  Greenmint Mouthwash Liqd (Mouthwashes) 8)  Toprol Xl 25 Mg Xr24h-tab (Metoprolol succinate) .Marland Kitchen.. 11/2 tabs  by mouth at night 9)  Sandoz  .... One scoop two times a day 10)  Patanol 0.1 % Soln (Olopatadine hcl) .... One drop each eye once a day as needed 11)  First-dukes Mouthwash Susp (Diphenhyd-hydrocort-nystatin) .Marland Kitchen.. 15 ml.  swish and spit every 6 hours as needed. 12)  Citalopram Hydrobromide 40 Mg Tabs (Citalopram hydrobromide) .... Take 1 tablet by mouth once a day 13)  Omeprazole 20 Mg Cpdr (Omeprazole) .... Take 1 tablet by mouth two times a day 14)  Zyrtec Allergy 10 Mg Tabs (Cetirizine hcl) .... Take 1 tablet by mouth once a day 15)  Fish Oil Oil (Fish oil) .... 1,000 mg. once daily 16)  Vitamin B Complex-c Caps (B complex-c) .... Take 1 capsule  by mouth once a day 17)  Calcium Carbonate-vitamin D 600-400 Mg-unit Tabs (Calcium carbonate-vitamin d) .... Take 1 tablet by mouth once a day 18)  L-arginine 500 Mg Caps (Arginine) .... Take 1 capsule by mouth once a day 19)  Multivitamins Tabs (Multiple vitamin) .... Take 1 tablet by mouth once a day 20)  Vitamin D 1000 Unit Tabs (Cholecalciferol) .... Take 1 tablet by mouth once a day 21)  Ferrous Sulfate 325 (65 Fe) Mg Tabs (Ferrous sulfate) .... Take 1 tablet by mouth once a day 22)  Vitamin B-12 500 Mcg Tabs (Cyanocobalamin) .... 1,000 micrograms. once daily 23)  Triamcinolone Acetonide 0.1 % Crea (Triamcinolone acetonide) .... Aaa three times a day as needed for itching  Patient Instructions: 1)  I'll call you with more information.  I need the derm notes first.  I still don't know what to call this problem.   Prescriptions: TRIAMCINOLONE ACETONIDE 0.1 % CREA (TRIAMCINOLONE ACETONIDE) AAA three times a day as  needed for itching  #15g x 2   Entered and Authorized by:   Crawford Givens MD   Signed by:   Crawford Givens MD on 02/23/2010   Method used:   Electronically to        [facility] (retail)       [address]       [place], [place]  [address]       Ph: [phone]       Fax: [phone]   RxID:   [id number]   Current Allergies (reviewed today): ! * MESTINON ! * DEMEROL ! DRYSOL (ALUMINUM CHLORIDE)

## 2010-06-29 NOTE — Progress Notes (Signed)
----   Converted from flag ---- ---- 03/30/2010 9:04 AM, Crawford Givens MD wrote: tsh 780.79 cmet/lipid 401.1 cbc 280.9   ---- 03/30/2010 8:45 AM, Mills Koller wrote: This patient is scheduled for CPX with you, I need lab orders with dx, please. Thanks, Camelia Eng  Here now ------------------------------

## 2010-06-29 NOTE — Progress Notes (Signed)
Summary: triamcinolone   Phone Note Refill Request Message from:  Fax from Pharmacy on March 14, 2010 10:20 AM  Refills Requested: Medication #1:  TRIAMCINOLONE ACETONIDE 0.1 % CREA AAA three times a day as needed for itching.   Last Refilled: 03/08/2010 Refill request from [facility]. [phone]  Initial call taken by: Melody Comas,  March 14, 2010 10:21 AM  Follow-up for Phone Call        sent.  Follow-up by: Crawford Givens MD,  March 14, 2010 10:53 AM    Prescriptions: TRIAMCINOLONE ACETONIDE 0.1 % CREA (TRIAMCINOLONE ACETONIDE) AAA three times a day as needed for itching  #15g x 2   Entered and Authorized by:   Crawford Givens MD   Signed by:   Crawford Givens MD on 03/14/2010   Method used:   Electronically to        [facility] (retail)       [address]       [place], [place]  [address]       Ph: [phone]       Fax: [phone]   RxID:   [id number]

## 2010-06-29 NOTE — Miscellaneous (Signed)
Summary: [name] Progress [facility]  [name] Progress [facility]   Imported By: Beau Fanny 07/08/2009 10:37:27  _____________________________________________________________________  External Attachment:    Type:   Image     Comment:   External Document

## 2010-06-29 NOTE — Letter (Signed)
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) CARPAL TUNNEL SYNDROME (DR KERNODLE) (ICD-354.0) ANEMIA, IRON DEFICIENCY (ICD-280.9) HYPOTHYROIDISM (ICD-244.9) ALLERGY, IMMUNOTHERAPY (ICD-995.3) ALLERGIC RHINITIS (ICD-477.9) FIBROMYALGIA (ICD-729.1) RHEUMATOID ARTHRITIS  (DR KERNODLE) (ICD-714.0) OSTEOARTHRITIS   (DR ZIEMINSKI/DR Gavin Potters) (ICD-715.90) OSTEOARTHROSIS, GENERALIZED, UNSPC SITE DR ZIEMINSKI (ICD-715.00) DIARRHEA (ICD-787.91) CONSTIPATION, MILD (ICD-564.00) GASTRITIS / BARRETTS/STRICTURE (ICD-535.50) GASTRIC ULCER, ACUTE (ICD-531.30) BLOOD IN STOOL VIA STOOL CARDS (ICD-578.1) BIPOLAR AFFECTIVE DISORDER, DEPRESSED, HX OF (ICD-V11.8) 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) CARPAL TUNNEL SYNDROME (DR KERNODLE) (ICD-354.0) ANEMIA, IRON DEFICIENCY (ICD-280.9) HYPOTHYROIDISM (ICD-244.9) ALLERGY, IMMUNOTHERAPY (ICD-995.3) ALLERGIC RHINITIS (ICD-477.9) FIBROMYALGIA (ICD-729.1) RHEUMATOID ARTHRITIS  (DR KERNODLE) (ICD-714.0) OSTEOARTHRITIS   (DR ZIEMINSKI/DR Gavin Potters) (ICD-715.90) OSTEOARTHROSIS, GENERALIZED, UNSPC SITE DR ZIEMINSKI (ICD-715.00) DIARRHEA (ICD-787.91) CONSTIPATION, MILD  (ICD-564.00) GASTRITIS / BARRETTS/STRICTURE (ICD-535.50) GASTRIC ULCER, ACUTE (ICD-531.30) BLOOD IN STOOL VIA STOOL CARDS (ICD-578.1) BIPOLAR AFFECTIVE DISORDER, DEPRESSED, HX OF (ICD-V11.8) 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 pain clinic 01/2010

## 2010-06-29 NOTE — Progress Notes (Signed)
Summary: pt has shingles  Phone Note Call from Patient Call back at Home Phone [phone] Call back at [phone]   Caller: Patient Call For: Crawford Givens MD Summary of Call: Pt states she has shingles on her scalp and around her eyes, says she has had this since the first of fall.  She thinks this is related to the rash under her arms.  She is asking for something to be called to [facility].  I advised her that it may be too late for anti virals to be effective. Initial call taken by: Lowella Petties CMA, AAMA,  May 02, 2010 11:01 AM  Follow-up for Phone Call        She is out of the window for antivirals.  This didn't look like shingles previously.  Before we change her meds/start new meds, we'd have to check her again.  Follow-up by: Crawford Givens MD,  May 02, 2010 11:03 AM  Additional Follow-up for Phone Call Additional follow up Details #1::        Patient Advised.  Additional Follow-up by: Delilah Shan CMA Duncan Dull),  May 02, 2010 11:27 AM     Appended Document: pt has shingles Patient says there is no point in coming in for an OV because the rash under her arms is better.  She says the rash seems to come out in the evenings when she is trying to "wind down" and then by morning, it is gone by the time she gets dressed so there is nothing to re-evaluate during the day.  The pain is there but no rash can be seen.  Appended Document: pt has shingles noted, will await OV as need per patient's preference.

## 2010-08-01 ENCOUNTER — Telehealth (INDEPENDENT_AMBULATORY_CARE_PROVIDER_SITE_OTHER): Payer: Self-pay | Admitting: *Deleted

## 2010-08-01 ENCOUNTER — Other Ambulatory Visit: Payer: Self-pay | Admitting: Family Medicine

## 2010-08-01 ENCOUNTER — Other Ambulatory Visit (INDEPENDENT_AMBULATORY_CARE_PROVIDER_SITE_OTHER): Payer: Medicare Other

## 2010-08-01 ENCOUNTER — Encounter (INDEPENDENT_AMBULATORY_CARE_PROVIDER_SITE_OTHER): Payer: Self-pay | Admitting: *Deleted

## 2010-08-01 DIAGNOSIS — E785 Hyperlipidemia, unspecified: Secondary | ICD-10-CM

## 2010-08-01 LAB — LIPID PANEL
HDL: 52.3 mg/dL (ref >39.00)
Triglycerides: 329 mg/dL — ABNORMAL HIGH (ref 0.0–149.0)

## 2010-08-08 NOTE — Progress Notes (Signed)
----   Converted from flag ---- ---- 07/31/2010 11:31 PM, Crawford Givens MD wrote: that's all.   ---- 07/31/2010 3:32 PM, Mills Koller wrote: Patient coming in the morning , this is the lab oreder you have in your notes,  Recheck lipids in 4 months with fasting labs.  272.0. Are  there other labs you want? Thanks, Terri ------------------------------

## 2010-08-22 ENCOUNTER — Ambulatory Visit: Payer: Medicare Other | Admitting: Ophthalmology

## 2010-09-04 ENCOUNTER — Ambulatory Visit: Payer: Medicare Other | Admitting: Ophthalmology

## 2010-09-11 ENCOUNTER — Other Ambulatory Visit: Payer: Self-pay | Admitting: *Deleted

## 2010-09-11 MED ORDER — METOPROLOL SUCCINATE ER 25 MG PO TB24: ORAL_TABLET | ORAL | Status: DC

## 2010-09-18 ENCOUNTER — Telehealth: Payer: Self-pay | Admitting: *Deleted

## 2010-09-18 NOTE — Telephone Encounter (Signed)
Noted  

## 2010-09-18 NOTE — Telephone Encounter (Signed)
Pt called, said she has been having yellow foamy stools and is asking what she should do.  She asks if her last stool cards were normal, and I told her they were.  I advised her to call her GI to see what he wanted to do, perhaps see her in his office.  Pt agreed.

## 2010-10-03 ENCOUNTER — Other Ambulatory Visit: Payer: Self-pay | Admitting: *Deleted

## 2010-10-03 MED ORDER — METOPROLOL SUCCINATE ER 25 MG PO TB24: ORAL_TABLET | ORAL | Status: DC

## 2010-10-10 NOTE — Assessment & Plan Note (Signed)
[facility]                            [place] OFFICE NOTE   NAME:[name], [name]                      MRN:          [id number]  DATE:08/10/2008                            DOB:          Sep 07, 1940    I was asked by Dr. Laurita Quint to consult on Olivia [name] who has a  chief complaint of chest discomfort and chest pain.   HISTORY OF PRESENT ILLNESS:  Olivia [name] is a talkative, very pleasant,  70 year old widowed white female who has been having chest pain at  night.  The first time it happened that woke her up.  She denies any  shortness of breath, any nausea, or diaphoresis.  She has had a history  of gastroesophageal reflux and gastric ulcers followed by Dr. Mechele Collin  per her history.  She has been in instructed by Dr. Hetty Ely to take  b.i.d. proton pump inhibitors specifically Nexium in the morning,  Protonix in the evening, but she has not followed this direction per se.   She denies any exertional chest discomfort or ischemic symptoms.   CARDIAC RISK FACTORS:  Age, history of hypertension, though she is  really not on any medications for such, and hyperlipidemia.  Her total  cholesterol is 262, HDL 48, LDL is 152.5, and triglycerides were 271.   PAST MEDICAL HISTORY:  She does not smoke.  She does not drink.  She has  not been routinely exercise because of surgery on her feet for  hammertoes.  She is anxious to get out and start doing some things.   FAMILY HISTORY:  Negative for premature coronary artery disease.   SOCIAL HISTORY:  She is retired and essentially says she is disabled  since 1994 with some low serotonin levels.  She could not handle stress.   REVIEW OF SYSTEMS:  Other than HPI is remarkable for history of asthma,  anemia, constipation, fatigue, irritable bowel syndrome, ulcer, kidney  disease, reflux, menstrual dysfunction, sexual dysfunction, urinary  problems, arthritis, thyroid disease, anxiety, and  depression.   CURRENT MEDICATIONS:  1. Levoxyl 50 mcg a day.  2. Tramadol 50 mg a day.  3. Xanax 1 p.o. t.i.d.  4. Citalopram 20 mg a day.  5. Legatrin 1 nightly.  6. Nexium 40 mg a day.  7. Protonix 40 mg nightly.  8. Mirtazapine 15 mg nightly.   PHYSICAL EXAMINATION:  VITAL SIGNS:  Her blood pressure today is 144/70  and her pulse is 90 and regular.  Her weight is 183.  She is 5 feet 4.  GENERAL:  She is in no acute distress.  HEENT:  Essentially normal.  NECK:  Supple.  Carotid upstrokes were equal bilateral without bruits.  No JVD.  Thyroid is not enlarged.  Trachea is midline.  LUNGS:  Clear to  auscultation and percussion.  HEART:  A poorly appreciated PMI.  Soft S1 and S2.  No click, murmur, or  rub.  ABDOMEN:  Soft.  Good bowel sounds.  No midline bruit.  No pulsatile  mass.  No hepatomegaly.  EXTREMITIES:  No cyanosis, clubbing, or edema.  Pulses  are intact.  There is no sign of DVT.  NEUROLOGIC:  Intact.  Affect is a little bit hyper today.   EKG shows normal sinus rhythm with no significant ST-segment changes.   ASSESSMENT:  Olivia [name] does not have coronary chest discomfort.  In  fact, I do not think it is cardiac in etiology.  Most likely her  gastroesophageal are functional.  I have recommend that she will follow  up with Dr. Kerrin Mo.  Dr. Hetty Ely may wish to treat her  hyperlipidemia with a statin.  I will see her back on a p.r.n. basis.     Thomas C. Daleen Squibb, MD, [facility]  Electronically Signed    TCW/MedQ  DD: 08/10/2008  DT: 08/11/2008  Job #: [id number]

## 2010-10-13 NOTE — Letter (Signed)
May 08, 2006     RE:  Olivia, SLUTSKY  MRN:  [id number]  /  DOB:  [birth year]   To Whom It May Concern:   Please excuse permanently Olivia [name], social security number [phone], from jury duty.  [name] has significant psychological  disease, fibromyalgia, osteoarthritis, and most recently ocular  myasthenia gravis.  All of these abnormalities make it significantly  difficult to sit for prolonged periods of time, to concentrate or  adequately participate in deliberations.   I do not feel she would be a productive member of any jury panel, and  her situation will not improve.  For these reasons, I suggest she be  permanently excused from jury duty.    Sincerely,      Arta Silence, MD  Electronically Signed    RNS/MedQ  DD: 05/08/2006  DT: 05/08/2006  Job #: [id number]

## 2010-10-24 NOTE — Telephone Encounter (Signed)
I called patient.  "I'm not upset, I just need some help."  She hadn't been seen by GI and there was a period of time before she could be seen.  She was asking for advice.  I'd like her to see GI and she still wants to see them, too [facility]).  Please forward this to [facility] to see if patient can be moved up on the schedule for the GI clinic.  If not, please notify me so we can try to make plans in the meantime.

## 2010-10-24 NOTE — Telephone Encounter (Signed)
Please let patient know (and if she isn't able to be seen sooner, let me know). Thanks.

## 2010-10-24 NOTE — Telephone Encounter (Signed)
Called [facility]. She was last seen in march by Tobe Sos P.A. And then given a 3 month FU for 11/23/2010 at 3pm with Tobe Sos.  Dr Bluford Kaufmann is only in the offce 1 day a week and there is nothing any sooner than 11/23/2010. They have put her on a cancellation list and she is welcome to call every day and speak to Vernona Rieger at [phone] to get in to a sooner appt. They said she could go to Acute care also if she felt she needed to. I will call the patient and let her know this if you need me to. Please let me know.

## 2010-10-24 NOTE — Telephone Encounter (Signed)
Pt called and said she contacted GI per our advice and was not able to schedule an appt with GI doctor until July and continues to have GI symptoms and would like to speak with CMA from our office or Dr. Para March.  She asked to be called back at the home [phone] first.  She also provided her cell phone [phone] as a contact number.  Please call patient to discuss. Thanks, [employer]

## 2010-10-31 ENCOUNTER — Telehealth: Payer: Self-pay | Admitting: *Deleted

## 2010-10-31 NOTE — Telephone Encounter (Signed)
Opened in error

## 2010-11-13 ENCOUNTER — Encounter: Payer: Self-pay | Admitting: Cardiovascular Disease

## 2010-12-15 ENCOUNTER — Ambulatory Visit: Payer: Medicare Other | Admitting: Gastroenterology

## 2010-12-21 ENCOUNTER — Encounter: Payer: Self-pay | Admitting: Family Medicine

## 2010-12-27 ENCOUNTER — Ambulatory Visit: Payer: Medicare Other | Admitting: Obstetrics and Gynecology

## 2011-01-04 ENCOUNTER — Encounter: Payer: Self-pay | Admitting: Family Medicine

## 2011-01-04 DIAGNOSIS — M1990 Unspecified osteoarthritis, unspecified site: Secondary | ICD-10-CM | POA: Insufficient documentation

## 2011-01-04 DIAGNOSIS — I701 Atherosclerosis of renal artery: Secondary | ICD-10-CM

## 2011-01-04 DIAGNOSIS — G7000 Myasthenia gravis without (acute) exacerbation: Secondary | ICD-10-CM | POA: Insufficient documentation

## 2011-01-04 DIAGNOSIS — K2270 Barrett's esophagus without dysplasia: Secondary | ICD-10-CM | POA: Insufficient documentation

## 2011-01-04 HISTORY — DX: Atherosclerosis of renal artery: I70.1

## 2011-01-04 HISTORY — DX: Barrett's esophagus without dysplasia: K22.70

## 2011-01-25 ENCOUNTER — Other Ambulatory Visit: Payer: Self-pay | Admitting: Family Medicine

## 2011-01-25 DIAGNOSIS — E785 Hyperlipidemia, unspecified: Secondary | ICD-10-CM

## 2011-01-30 ENCOUNTER — Other Ambulatory Visit (INDEPENDENT_AMBULATORY_CARE_PROVIDER_SITE_OTHER): Payer: Medicare Other

## 2011-01-30 DIAGNOSIS — E785 Hyperlipidemia, unspecified: Secondary | ICD-10-CM

## 2011-01-30 LAB — LIPID PANEL: Cholesterol: 229 mg/dL — ABNORMAL HIGH (ref 0–200)

## 2011-01-31 ENCOUNTER — Telehealth: Payer: Self-pay | Admitting: *Deleted

## 2011-01-31 NOTE — Telephone Encounter (Signed)
Pt needs order for bilateral breast prostheses and 4 surgical bras faxed to [facility] at fax number [phone].

## 2011-01-31 NOTE — Telephone Encounter (Signed)
Please fax order for bilateral breast prostheses and 4 surgical bras to [facility] at fax number [phone].

## 2011-02-01 ENCOUNTER — Encounter: Payer: Self-pay | Admitting: *Deleted

## 2011-02-01 NOTE — Telephone Encounter (Signed)
Order printed and faxed.

## 2011-03-12 ENCOUNTER — Ambulatory Visit (INDEPENDENT_AMBULATORY_CARE_PROVIDER_SITE_OTHER): Payer: Medicare Other | Admitting: Family Medicine

## 2011-03-12 ENCOUNTER — Encounter: Payer: Self-pay | Admitting: Family Medicine

## 2011-03-12 VITALS — BP 152/100 | HR 90 | Temp 97.9°F | Wt 182.1 lb

## 2011-03-12 DIAGNOSIS — E039 Hypothyroidism, unspecified: Secondary | ICD-10-CM

## 2011-03-12 DIAGNOSIS — Z8659 Personal history of other mental and behavioral disorders: Secondary | ICD-10-CM

## 2011-03-12 DIAGNOSIS — E785 Hyperlipidemia, unspecified: Secondary | ICD-10-CM

## 2011-03-12 DIAGNOSIS — E7800 Pure hypercholesterolemia, unspecified: Secondary | ICD-10-CM

## 2011-03-12 DIAGNOSIS — IMO0001 Reserved for inherently not codable concepts without codable children: Secondary | ICD-10-CM

## 2011-03-12 NOTE — Progress Notes (Signed)
Hypothyroid.  TSH wnl.  Labs d/w pt. No ant neck pain, no dysphagia.    H/o eczema of the axilla.  Per Dr. Gwen Pounds.    Elevated Cholesterol: Using medications without problems:yes Muscle aches: no Diet compliance:partial Exercise:minimal Other complaints:see below  Since last OV, she had f/u with gyn (Dr. Gar Ponto) re: R ovarian cyst and back pain.  She had follow up with vascular clinic and was referred to Dr. Gerrit Heck.  I asked her to call about rescheduling this.  I don't have records from obgyn or vascular.    She's been seeing psych about insomnia.  This has been longstanding for her after caring for her husband and parents.  It likely still has a component of grief.    PMH and SH reviewed  Meds, vitals, and allergies reviewed.   ROS: See HPI.  Otherwise negative.    GEN: nad, alert and oriented, speech is fluent but she is tangential and can be distracted in conversation HEENT: mucous membranes moist NECK: supple w/o LA CV: rrr. PULM: ctab, no inc wob ABD: soft, +bs EXT: no edema SKIN: no acute rash

## 2011-03-12 NOTE — Patient Instructions (Addendum)
Check the American Heart Association and look at the low fat diets.  We should recheck your lipids in about 3-4 months.  We can talk about options at that point.  Please schedule a OV a few days after the labs.  We'll request your records. I would reschedule your visit with Dr. Mercy Riding.

## 2011-03-13 ENCOUNTER — Encounter: Payer: Self-pay | Admitting: Family Medicine

## 2011-03-13 NOTE — Assessment & Plan Note (Signed)
Per psych, no SI/HI.  

## 2011-03-13 NOTE — Assessment & Plan Note (Addendum)
She'll work on diet and we'll consider further treatment depending on her response.  Given her med list and other problems, I didn't want to start a statin and risk more aches. >25 min spent with face to face with patient, >50% counseling and/or coordinating care

## 2011-03-13 NOTE — Assessment & Plan Note (Signed)
Continue current meds 

## 2011-03-13 NOTE — Assessment & Plan Note (Signed)
With back pain. We've requested records and I've asked her to keep the appointment with Dr. Mercy Riding.

## 2011-03-26 ENCOUNTER — Other Ambulatory Visit: Payer: Self-pay | Admitting: *Deleted

## 2011-03-26 MED ORDER — METOPROLOL SUCCINATE ER 25 MG PO TB24: ORAL_TABLET | ORAL | Status: DC

## 2011-03-26 NOTE — Telephone Encounter (Signed)
Received faxed refill request from pharmacy. Rx sent to pharmacy electronically. 

## 2011-04-03 ENCOUNTER — Other Ambulatory Visit: Payer: Self-pay | Admitting: *Deleted

## 2011-04-03 MED ORDER — FIRST-DUKES MOUTHWASH MT SUSP: 15.0000 mL | Freq: Four times a day (QID) | OROMUCOSAL | Status: DC | PRN

## 2011-04-03 NOTE — Telephone Encounter (Signed)
Received faxed refill request from pharmacy for [employer] Mouthwash #[id number], using 15 mls (1 tablespoon) swish and spit every 6 hours as needed, LR 04/06/10.  Medication is not on med sheet. Is it okay to refill medication?

## 2011-04-03 NOTE — Telephone Encounter (Signed)
Okay to fill, please call/fax in. [employer] Mouthwash #[id number], using 15 mls (1 tablespoon) swish and spit every 6 hours as needed.  1 rf.  Thanks.

## 2011-06-11 ENCOUNTER — Other Ambulatory Visit (INDEPENDENT_AMBULATORY_CARE_PROVIDER_SITE_OTHER): Payer: Medicare Other

## 2011-06-11 DIAGNOSIS — E7800 Pure hypercholesterolemia, unspecified: Secondary | ICD-10-CM

## 2011-06-11 LAB — LDL CHOLESTEROL, DIRECT: Direct LDL: 127.2 mg/dL

## 2011-06-11 LAB — LIPID PANEL
HDL: 52.8 mg/dL (ref >39.00)
Triglycerides: 429 mg/dL — ABNORMAL HIGH (ref 0.0–149.0)

## 2011-06-15 ENCOUNTER — Ambulatory Visit: Payer: Medicare Other | Admitting: Family Medicine

## 2011-06-26 ENCOUNTER — Encounter: Payer: Self-pay | Admitting: Family Medicine

## 2011-06-26 ENCOUNTER — Ambulatory Visit (INDEPENDENT_AMBULATORY_CARE_PROVIDER_SITE_OTHER): Payer: Medicare Other | Admitting: Family Medicine

## 2011-06-26 DIAGNOSIS — L309 Dermatitis, unspecified: Secondary | ICD-10-CM

## 2011-06-26 DIAGNOSIS — E785 Hyperlipidemia, unspecified: Secondary | ICD-10-CM

## 2011-06-26 DIAGNOSIS — E039 Hypothyroidism, unspecified: Secondary | ICD-10-CM

## 2011-06-26 DIAGNOSIS — L259 Unspecified contact dermatitis, unspecified cause: Secondary | ICD-10-CM

## 2011-06-26 DIAGNOSIS — Z8659 Personal history of other mental and behavioral disorders: Secondary | ICD-10-CM

## 2011-06-26 DIAGNOSIS — E7800 Pure hypercholesterolemia, unspecified: Secondary | ICD-10-CM

## 2011-06-26 NOTE — Patient Instructions (Addendum)
Recheck lipids later this summer and then we'll talk at that point.  Keep working on your diet and try to walk some more after the eczema is better.   Take care.  Glad to see you.  Recheck labs in 6 months with OV a few days later.

## 2011-06-26 NOTE — Progress Notes (Signed)
Seeing Dr. Gerrit Heck about lumbar pathology/back pain.    Seeing Dr. Gwen Pounds about eczema under her arms.  She's improved and feeling much better about this.    Insomnia per Dr. Imogene Burn with psych.  Remeron was increased.  "I have trouble turning off the day.  If I get to sleep, I sleep good."  She had a childhood friend die recently.  This has been tough for her.    Lipids:  Elevated.  Not on meds.  She had lost 6 lbs with diet.  We talked about options.  See plan.   Meds, vitals, and allergies reviewed.   ROS: See HPI.  Otherwise, noncontributory.  GEN: nad, alert and oriented HEENT: mucous membranes moist NECK: supple w/o LA CV: RRr PULM: ctab, no inc wob ABD: soft, +bs EXT: no edema SKIN: no acute rash

## 2011-06-28 ENCOUNTER — Other Ambulatory Visit: Payer: Self-pay | Admitting: *Deleted

## 2011-06-28 ENCOUNTER — Encounter: Payer: Self-pay | Admitting: Family Medicine

## 2011-06-28 MED ORDER — LEVOTHYROXINE SODIUM 50 MCG PO TABS: 50.0000 ug | ORAL_TABLET | Freq: Every day | ORAL | Status: DC

## 2011-06-28 NOTE — Assessment & Plan Note (Addendum)
D/w pt about weight loss and health effects of obesity.  Recheck lipids later in 2013.  No change in meds for now.  >25 min spent with face to face with patient, >50% counseling and/or coordinating care

## 2011-06-28 NOTE — Assessment & Plan Note (Signed)
Per Dr. Chen

## 2011-07-06 ENCOUNTER — Emergency Department: Payer: Self-pay | Admitting: Unknown Physician Specialty

## 2011-07-06 LAB — BASIC METABOLIC PANEL
Anion Gap: 11 (ref 7–16)
BUN: 13 mg/dL (ref 7–18)
Calcium, Total: 9.1 mg/dL (ref 8.5–10.1)
Chloride: 107 mmol/L (ref 98–107)
Co2: 27 mmol/L (ref 21–32)
Creatinine: 0.66 mg/dL (ref 0.60–1.30)
EGFR (Non-African Amer.): >60
Osmolality: 289 (ref 275–301)
Potassium: 4 mmol/L (ref 3.5–5.1)

## 2011-07-06 LAB — URINALYSIS, COMPLETE
Bilirubin,UR: NEGATIVE
Ketone: NEGATIVE
Protein: NEGATIVE
RBC,UR: NONE SEEN /HPF (ref 0–5)
Specific Gravity: 1.018 (ref 1.003–1.030)
Squamous Epithelial: 1
WBC UR: 28 /HPF (ref 0–5)

## 2011-07-07 LAB — CBC
HGB: 15.1 g/dL (ref 12.0–16.0)
MCH: 33 pg (ref 26.0–34.0)
MCHC: 34.4 g/dL (ref 32.0–36.0)
MCV: 96 fL (ref 80–100)
Platelet: 261 10*3/uL (ref 150–440)
RBC: 4.57 10*6/uL (ref 3.80–5.20)
WBC: 6.7 10*3/uL (ref 3.6–11.0)

## 2011-07-09 ENCOUNTER — Telehealth: Payer: Self-pay | Admitting: Family Medicine

## 2011-07-09 NOTE — Telephone Encounter (Signed)
Noted  

## 2011-07-09 NOTE — Telephone Encounter (Signed)
Please call and get update on patient.

## 2011-07-09 NOTE — Telephone Encounter (Signed)
Went to [facility], dx with UTI.  Doing better.  Still has not had a significant BM but she is not in pain.  She says she hasn't been eating much because of wanting to lose weight.  She did use Miralax this morning.

## 2011-07-09 NOTE — Telephone Encounter (Signed)
Triage Record Num: [id number] Operator: Amy Head Patient Name: Olivia [name] Call Date & Time: 07/06/2011 5:08:08PM Patient Phone: [phone] PCP: Crawford Givens Patient Gender: Female PCP Fax : Patient DOB: [birth year] Practice Name: [facility] Reason for Call: Caller: Olivia [name]/Mother; PCP: Crawford Givens Olivia [name]); CB#: [phone]; Call regarding Urinary and Bowel Problems; States that she has not urinated but approx 1 cup full of urine since yesterday. Drinks lots of water. Can not have a bowel movement either. Passed a small amount of stool passed yest and today. Takes Cholestyramine and usually does not have any problems passing stool. Afebrile. "Feel blah, don't have any energy." No pain or frequency with urination. Has flank pain and pain to rt hip. All emergent sxs per Flank Pain protocol r/o. Advised to be seen within 4 hours per guidelines. Protocol(s) Used: Flank Pain Protocol(s) Used: Urinary Symptoms - Female Recommended Outcome per Protocol: See Provider within 4 hours Reason for Outcome: Flank pain Flank pain or low back pain AND urinary tract symptoms Care Advice: ~ Another adult should drive. ~ Call provider if symptoms worsen or new symptoms develop. ~ Tell provider medical history of renal disease; especially if have only one kidney. Increase intake of fluids. Try to drink 8 oz. (.2 liter) every hour when awake, including unsweetened cranberry juice, unless on restricted fluids for other medical reasons. Take sips of fluid or eat ice chips if nauseated or vomiting. ~ ~ SYMPTOM / CONDITION MANAGEMENT ~ CAUTIONS ~ List, or take, all current prescription(s), nonprescription or alternative medication(s) to provider for evaluation. Limit carbonated, alcoholic, and caffeinated beverages such as coffee, tea and soda. Avoid nonprescription cold and allergy medications that contain caffeine. Limit intake of tomatoes, fruit juices (except for unsweetened cranberry juice),  dairy products, spicy foods, sugar, and artificial sweeteners (aspartame or saccharine). Stop or decrease smoking. Reducing exposure to bladder irritants may help lessen urgency. ~ Analgesic/Antipyretic Advice - Acetaminophen: Consider acetaminophen as directed on label or by pharmacist/provider for pain or fever PRECAUTIONS: - Use if there is no history of liver disease, alcoholism, or intake of three or more alcohol drinks per day - Only if approved by provider during pregnancy or when breastfeeding - During pregnancy, acetaminophen should not be taken more than 3 consecutive days without telling provider - Do not exceed recommended dose or frequency ~ Systemic Inflammatory Response Syndrome (SIRS): Watch for signs of a generalized, whole body infection. Occurs within days of a localized infection, especially of the urinary, GI, respiratory or nervous systems; or after a traumatic injury or invasive procedure. - Call EMS 911 if symptoms have worsened, such as increasing confusion or unusual drowsiness; cold and clammy skin; no urine output; rapid respiration (>30/min.) or slow respiration (<10/min.); struggling to breathe. - Go to the ED immediately for early symptoms of rapid pulse >90/min. or rapid breathing >20/min. at rest; chills; oral temperature >100.4 F (38 C) or <96.8 F (36 C) when associated with conditions noted. ~ ~ Analgesic/Antipyretic Advice - NSAIDs: 07/06/2011 5:29:04PM Page 1 of 2 [email or web address] [facility]urse Triage Call Report Patient Name: Olivia [name] continuation page/s Consider aspirin, ibuprofen, naproxen or ketoprofen for pain or fever as directed on label or by pharmacist/provider. PRECAUTIONS: - If over 28 years of age, should not take longer than 1 week without consulting provider. EXCEPTIONS: - Should not be used if taking blood thinners or have bleeding problems. - Do not use if have history of sensitivity/allergy to any of these medications; or history  of cardiovascular, ulcer, kidney,  liver disease or diabetes unless approved by provider. - Do not exceed recommended dose or frequency. 07/06/2011 5:29:04PM Page 2 of 2 [email or web address]

## 2011-10-23 ENCOUNTER — Other Ambulatory Visit: Payer: Self-pay | Admitting: *Deleted

## 2011-10-23 MED ORDER — METOPROLOL SUCCINATE ER 25 MG PO TB24: ORAL_TABLET | ORAL | Status: DC

## 2011-12-04 ENCOUNTER — Ambulatory Visit: Payer: Self-pay | Admitting: Ophthalmology

## 2011-12-04 LAB — HEMOGLOBIN: HGB: 13.9 g/dL (ref 12.0–16.0)

## 2011-12-14 ENCOUNTER — Other Ambulatory Visit (INDEPENDENT_AMBULATORY_CARE_PROVIDER_SITE_OTHER): Payer: Medicare Other

## 2011-12-14 DIAGNOSIS — E039 Hypothyroidism, unspecified: Secondary | ICD-10-CM

## 2011-12-14 DIAGNOSIS — E7800 Pure hypercholesterolemia, unspecified: Secondary | ICD-10-CM

## 2011-12-14 LAB — TSH: TSH: 2.29 u[IU]/mL (ref 0.35–5.50)

## 2011-12-14 LAB — COMPREHENSIVE METABOLIC PANEL
Albumin: 4 g/dL (ref 3.5–5.2)
Alkaline Phosphatase: 73 U/L (ref 39–117)
CO2: 26 mEq/L (ref 19–32)
Chloride: 107 mEq/L (ref 96–112)
GFR: 95.49 mL/min (ref >60.00)
Glucose, Bld: 90 mg/dL (ref 70–99)
Potassium: 4.1 mEq/L (ref 3.5–5.1)
Sodium: 142 mEq/L (ref 135–145)
Total Protein: 6.6 g/dL (ref 6.0–8.3)

## 2011-12-17 ENCOUNTER — Ambulatory Visit: Payer: Self-pay | Admitting: Ophthalmology

## 2011-12-17 ENCOUNTER — Other Ambulatory Visit: Payer: Medicare Other

## 2011-12-18 ENCOUNTER — Telehealth: Payer: Self-pay | Admitting: Family Medicine

## 2011-12-18 NOTE — Telephone Encounter (Signed)
Instructed patient that Dr. Para March wants her to got to ED as it is newly developed AF.  Caller protests and states she does not want to go. Reinforced need for evaluation.  She states she does not want to go to ED. Gave rationale for same.  She finally agreed to go and states plan to go to [facility].  Her cell phone number is [phone] in case MD wants to contact her.

## 2011-12-18 NOTE — Telephone Encounter (Signed)
Caller: Olivia [name]/Patient; PCP: Crawford Givens Clelia Croft); CB#: [phone]; Call regarding Had Cataracts removed  12/17/11 and She Is Concerned About EKG/ AF; States she had chest pain and shortness of breath on 7/18.  She relates she did not mention it at follow up visit with Opthalomologist because her daughter was there.  Has appt w/ Dr. Para March on 12/24/11 and asks if she needs to be seen sooner.  Frequent episodes of chest tightness/palpitations; denies any on 12/18/11. Call provider immediately per Irregular Heartbeat protocol as patient is on Beta Blockers, Thyroid Medications and she reports having ongoing or repeated episoces of irregular pulse or heart rate over 90 or below 50.  Note to office and RN calling office for assistance.

## 2011-12-18 NOTE — Telephone Encounter (Signed)
If patient has developed A fib or if she had CP/SOB, then to go to ER.  O/w needs OV in next 1-2 days.

## 2011-12-18 NOTE — Telephone Encounter (Signed)
Amy with CAN notified as instructed. Amy will notify pt.

## 2011-12-18 NOTE — Telephone Encounter (Signed)
Amy with CAN is on phone now; pt denies chest pain tightness or fluttering today. No SOB today.Please advise.

## 2011-12-19 NOTE — Telephone Encounter (Signed)
Please call pt since I'm not in office yet.  Please get details and get records from [facility].  Thanks.

## 2011-12-19 NOTE — Telephone Encounter (Signed)
Noted, thanks, I'll await the records.   

## 2011-12-19 NOTE — Telephone Encounter (Signed)
Spoke to patient and was advised that she went to the [facility] across from [facility]. Patient states that she saw Dr. Lowry Bowl and was advised that her EKG looked okay. Patient states that Dr. Lowry Bowl told her that her chest pain was probably caused by all of the stress that she has been under lately. Patient stated that Dr. Lowry Bowl told her to go home and take 1/2 of a xanax and get some rest. Patient stated that she sleep for 11 hours and feels well rested now. Patient stated that she requested that Dr. Lowry Bowl fax over his notes to Dr. Para March and she will call this morning and make sure that it gets done.

## 2011-12-24 ENCOUNTER — Encounter: Payer: Self-pay | Admitting: Family Medicine

## 2011-12-24 ENCOUNTER — Ambulatory Visit (INDEPENDENT_AMBULATORY_CARE_PROVIDER_SITE_OTHER): Payer: Medicare Other | Admitting: Family Medicine

## 2011-12-24 VITALS — BP 122/84 | HR 78 | Temp 97.9°F | Wt 177.8 lb

## 2011-12-24 DIAGNOSIS — I4891 Unspecified atrial fibrillation: Secondary | ICD-10-CM

## 2011-12-24 DIAGNOSIS — I480 Paroxysmal atrial fibrillation: Secondary | ICD-10-CM

## 2011-12-24 MED ORDER — ASPIRIN 325 MG PO TABS: 325.0000 mg | ORAL_TABLET | Freq: Every day | ORAL | Status: DC

## 2011-12-24 NOTE — Patient Instructions (Signed)
See Shirlee Limerick about your referral before you leave today (about the echo and the cardiology appointment). Take 325mg  of aspirin a day with food, at least until you see the cardiologist.  If you have another episode, then take an extra metoprolol.  Avoid caffeine.

## 2011-12-24 NOTE — Progress Notes (Signed)
She had transient AF on EGK before eye surgery that resolved before the operation itself.  This is the first known episode, but she has felt similar episodes prev.  She felt the episode, could feel the palpitations. She isn't on aspirin yet.  No h/o CVA.  Is taking metoprolol daily.  We discussed this at length.  I don't have the old EGK to review.   PMH and SH reviewed  ROS: See HPI, otherwise noncontributory.  Meds, vitals, and allergies reviewed.   nad ncat Mmm rrr ctab abd soft, not ttp Ext w/o edema

## 2011-12-25 ENCOUNTER — Encounter: Payer: Self-pay | Admitting: Family Medicine

## 2011-12-25 DIAGNOSIS — R002 Palpitations: Secondary | ICD-10-CM | POA: Insufficient documentation

## 2011-12-25 NOTE — Assessment & Plan Note (Signed)
It sounds like she's had mult episodes.  She had AF reported to her pre op but I don't have the EKG.  I am not concerned for ongoing ischemia, she has no chest pain.  Wills start 325 mg ASA.  She is on PPI already and should be able to tolerate the ASA.  Continue BB for now, take extra dose of metoprolol if she has another episode.  Check echo and refer to cards. She agrees.  >25 min spent with face to face with patient, >50% counseling and/or coordinating care, discussing path/phys PAF.

## 2011-12-31 ENCOUNTER — Ambulatory Visit (INDEPENDENT_AMBULATORY_CARE_PROVIDER_SITE_OTHER): Payer: Medicare Other | Admitting: Cardiovascular Disease

## 2011-12-31 ENCOUNTER — Other Ambulatory Visit: Payer: Self-pay

## 2011-12-31 ENCOUNTER — Other Ambulatory Visit (INDEPENDENT_AMBULATORY_CARE_PROVIDER_SITE_OTHER): Payer: Medicare Other

## 2011-12-31 ENCOUNTER — Encounter: Payer: Self-pay | Admitting: Cardiovascular Disease

## 2011-12-31 VITALS — BP 132/90 | HR 84 | Ht 64.0 in | Wt 180.0 lb

## 2011-12-31 DIAGNOSIS — I480 Paroxysmal atrial fibrillation: Secondary | ICD-10-CM

## 2011-12-31 DIAGNOSIS — I701 Atherosclerosis of renal artery: Secondary | ICD-10-CM

## 2011-12-31 DIAGNOSIS — E785 Hyperlipidemia, unspecified: Secondary | ICD-10-CM

## 2011-12-31 DIAGNOSIS — I10 Essential (primary) hypertension: Secondary | ICD-10-CM

## 2011-12-31 DIAGNOSIS — I4891 Unspecified atrial fibrillation: Secondary | ICD-10-CM

## 2011-12-31 NOTE — Patient Instructions (Addendum)
You are doing well. Change aspirin to 81 mg one or two pills a day We will investigate your EKGs, looking for atrial fibrillation Call the office if you have symptoms concerning for atrial fibrillation  Please call us if you have new issues that need to be addressed before your next appt.

## 2011-12-31 NOTE — Assessment & Plan Note (Signed)
She would likely benefit from cholesterol medication. She reports that "Dr. Para March will likely start me on something." Options include a low-dose statin titrating up as needed. Could add fenofibrate as well.

## 2011-12-31 NOTE — Progress Notes (Signed)
Patient ID: Olivia [name], female    DOB: [birth year], 71 y.o.   MRN: [id number]  HPI Comments: Ms. Olivia [name] is a very pleasant 71 year old woman with hyperlipidemia, lumbar pain, insomnia, eczema status post laser treatment by dermatology, who presents for evaluation of atrial fibrillation.  She reports that she was having eye surgery and after she came out of the operating room, she was in atrial fibrillation per the nurses. EKG was done and interpreted by the nursing staff. There is no EKG in the [facility] Muse showing atrial fibrillation, rather all of her EKGs showed normal sinus rhythm.   She does report having "irregular heartbeats" for years though she reports this does not last very long, only several minutes. 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 Lenise Herald showed no significant coronary artery disease, no reocclusion of her right renal artery  Echocardiogram August 2013 was essentially normal EKG today shows normal sinus rhythm rate 84 beats a minute, no significant ST-T wave changes Laboratory shows total cholesterol 256, LDL 112, triglycerides 465    Outpatient Encounter Prescriptions as of 12/31/2011  Medication Sig Dispense Refill  . acetaminophen (TYLENOL) 325 MG tablet Take 650 mg by mouth every 6 (six) hours as needed.      . ALPRAZolam (XANAX XR) 2 MG 24 hr tablet Take 2 mg by mouth 2 (two) times daily.       . Ascorbic Acid (VITAMIN C) 1000 MG tablet Take 1,000 mg by mouth 2 (two) times daily.       Marland Kitchen aspirin (BAYER ASPIRIN) 325 MG tablet Take 1 tablet (325 mg total) by mouth daily.      . Calcium Carbonate-Vit D-Min 600-400 MG-UNIT TABS Take 2 tablets by mouth daily.       . cholestyramine (QUESTRAN) 4 GM/DOSE powder  Take by mouth 2 (two) times daily with a meal.        . Coenzyme Q10 200 MG capsule Take 200 mg by mouth daily.        . Cyanocobalamin (VITAMIN B 12 PO) Take 2,000 mcg by mouth daily.       . Diphenhyd-Hydrocort-Nystatin (FIRST-DUKES MOUTHWASH) SUSP Use as directed 15 mLs in the mouth or throat every 6 (six) hours as needed. Swish and spit.  240 mL  1  . DULoxetine (CYMBALTA) 60 MG capsule Take 60 mg by mouth daily.        . ferrous sulfate (MEIJER FERROUS SULFATE) 325 (65 FE) MG tablet Take 325 mg by mouth daily.        . fish oil-omega-3 fatty acids 1000 MG capsule Take 2 g by mouth daily.      Marland Kitchen gabapentin (NEURONTIN) 300 MG capsule Take 300 mg by mouth 2 (two) times daily.        Marland Kitchen levothyroxine (SYNTHROID, LEVOTHROID) 50 MCG tablet Take 1 tablet (50 mcg total) by mouth daily.  30 tablet  11  . Melatonin 5 MG TABS Take 1 tablet by mouth at bedtime.      . metoprolol succinate (TOPROL XL) 25 MG 24 hr tablet Take one and one half tablets by mouth at night  45 tablet  2  . mirtazapine (REMERON) 30 MG tablet Take one and one half (45 mg Total) at bedtime.      Marland Kitchen  Multiple Vitamin (MULTIVITAMIN) tablet Take 1 tablet by mouth daily.        Marland Kitchen omeprazole (PRILOSEC) 40 MG capsule Take 40 mg by mouth daily.      . Pseudoephedrine-Ibuprofen 30-200 MG TABS Take 1 tablet by mouth daily.      Marland Kitchen VITAMIN B COMPLEX-C PO Take 1 capsule by mouth daily.          Review of Systems  Constitutional: Negative.   HENT: Negative.   Eyes: Negative.   Respiratory: Negative.   Cardiovascular: Positive for palpitations.  Gastrointestinal: Negative.   Musculoskeletal: Negative.   Skin: Negative.   Neurological: Negative.   Hematological: Negative.   Psychiatric/Behavioral: Negative.   All other systems reviewed and are negative.    BP 132/90  Pulse 84  Ht 5\' 4"  (1.626 m)  Wt 180 lb (81.647 kg)  BMI 30.90 kg/m2  Physical Exam  Nursing note and vitals reviewed. Constitutional: She is oriented to person,  place, and time. She appears well-developed and well-nourished.  HENT:  Head: Normocephalic.  Nose: Nose normal.  Mouth/Throat: Oropharynx is clear and moist.  Eyes: Conjunctivae are normal. Pupils are equal, round, and reactive to light.  Neck: Normal range of motion. Neck supple. No JVD present.  Cardiovascular: Normal rate, regular rhythm, normal heart sounds and intact distal pulses.  Exam reveals no gallop and no friction rub.   No murmur heard. Pulmonary/Chest: Effort normal and breath sounds normal. No respiratory distress. She has no wheezes. She has no rales. She exhibits no tenderness.  Abdominal: Soft. Bowel sounds are normal. She exhibits no distension. There is no tenderness.  Musculoskeletal: Normal range of motion. She exhibits no edema and no tenderness.  Lymphadenopathy:    She has no cervical adenopathy.  Neurological: She is alert and oriented to person, place, and time. Coordination normal.  Skin: Skin is warm and dry. No rash noted. No erythema.  Psychiatric: She has a normal mood and affect. Her behavior is normal. Judgment and thought content normal.         Assessment and Plan

## 2011-12-31 NOTE — Assessment & Plan Note (Signed)
She may benefit from repeat renal ultrasound. Possible fibromuscular dysplasia in 1984. Details unavailable. If needed, ultrasound could be done through our office.

## 2011-12-31 NOTE — Assessment & Plan Note (Signed)
No documentation of atrial fibrillation on EKG, even in the [facility] MUSE that records all EKGs done through the hospital system. Uncertain if her EKG was lost or was misinterpreted by nursing. She certainly could have had ectopy at my have appeared to look like atrial fibrillation. If she is in atrial fibrillation, it is certainly not for a prolonged period of time. She reports only having symptoms for seconds at a time. Normal echocardiogram. Few other risk factors. We have suggested she continue on low-dose aspirin and beta blocker. We would recommend she come to our office if she feels she is in atrial fibrillation for prolonged period again for EKG. No further workup at this time.

## 2011-12-31 NOTE — Assessment & Plan Note (Signed)
Blood pressure is well controlled on today's visit. No changes made to the medications. 

## 2012-01-17 ENCOUNTER — Other Ambulatory Visit: Payer: Self-pay | Admitting: *Deleted

## 2012-01-17 MED ORDER — FIRST-DUKES MOUTHWASH MT SUSP: 15.0000 mL | Freq: Four times a day (QID) | OROMUCOSAL | Status: DC | PRN

## 2012-01-17 NOTE — Telephone Encounter (Signed)
Faxed refill request   

## 2012-01-17 NOTE — Telephone Encounter (Signed)
Sent!

## 2012-01-18 ENCOUNTER — Other Ambulatory Visit: Payer: Self-pay | Admitting: *Deleted

## 2012-01-18 MED ORDER — METOPROLOL SUCCINATE ER 25 MG PO TB24: ORAL_TABLET | ORAL | Status: DC

## 2012-02-06 ENCOUNTER — Ambulatory Visit (INDEPENDENT_AMBULATORY_CARE_PROVIDER_SITE_OTHER): Payer: Medicare Other | Admitting: Family Medicine

## 2012-02-06 ENCOUNTER — Encounter: Payer: Self-pay | Admitting: Family Medicine

## 2012-02-06 VITALS — BP 120/78 | HR 84 | Temp 98.1°F | Wt 182.0 lb

## 2012-02-06 DIAGNOSIS — I4891 Unspecified atrial fibrillation: Secondary | ICD-10-CM

## 2012-02-06 DIAGNOSIS — E785 Hyperlipidemia, unspecified: Secondary | ICD-10-CM

## 2012-02-06 DIAGNOSIS — I480 Paroxysmal atrial fibrillation: Secondary | ICD-10-CM

## 2012-02-06 DIAGNOSIS — E7800 Pure hypercholesterolemia, unspecified: Secondary | ICD-10-CM

## 2012-02-06 DIAGNOSIS — Z23 Encounter for immunization: Secondary | ICD-10-CM

## 2012-02-06 MED ORDER — PRAVASTATIN SODIUM 20 MG PO TABS: 20.0000 mg | ORAL_TABLET | Freq: Every day | ORAL | Status: DC

## 2012-02-06 NOTE — Patient Instructions (Signed)
Come back for labs in about 2 months and try taking the pravastatin in the meantime.  If you have a lot of aches, then stop the medicine and notify me.  Take care.

## 2012-02-06 NOTE — Assessment & Plan Note (Signed)
Start pravastatin, myalgia caution given.  D/w pt about weight and diet.

## 2012-02-06 NOTE — Progress Notes (Signed)
She saw Dr. Mariah Milling and note was reviewed. No futher eval indicated at the time of the cards OV.  In talking with patient, she had likely had these episodes intermittently for a long while.  She thinks it got worse after her husband died.  However, she doesn't have persistent sx.  She'll have brief episodes that self resolve. No CP or SOB currently.  No syncope.    HLD d/w pt, re: options.  She's not sticking to cholesterol diet.  Discussed.  She admits to having trouble with weight, possibly related to psych meds.  Husband and father both died on 46 and this is tough for patient.  We talked about possible use of statin and possible ADE of myalgias.  Her granddaughter may move in with her.  This would be good for patient, she wouldn't be cooking for just herself.    Meds, vitals, and allergies reviewed.   ROS: See HPI.  Otherwise, noncontributory.  GEN: nad, alert and oriented HEENT: mucous membranes moist NECK: supple w/o LA CV: rrr PULM: ctab, no inc wob ABD: soft, +bs EXT: no edema SKIN: no acute rash

## 2012-02-06 NOTE — Assessment & Plan Note (Addendum)
W/o documented AF.  Short episodes.  Not clinically significant.  Continue BB.  >25 min spent with face to face with patient, >50% counseling and/or coordinating care.

## 2012-03-27 ENCOUNTER — Encounter: Payer: Self-pay | Admitting: Family Medicine

## 2012-03-27 ENCOUNTER — Ambulatory Visit (INDEPENDENT_AMBULATORY_CARE_PROVIDER_SITE_OTHER): Payer: Medicare Other | Admitting: Family Medicine

## 2012-03-27 VITALS — BP 130/86 | HR 76 | Temp 97.9°F | Wt 181.0 lb

## 2012-03-27 DIAGNOSIS — M79673 Pain in unspecified foot: Secondary | ICD-10-CM

## 2012-03-27 DIAGNOSIS — M79609 Pain in unspecified limb: Secondary | ICD-10-CM

## 2012-03-27 NOTE — Patient Instructions (Addendum)
I'll send a note to [place] [facility].  Take care. Glad to see you.

## 2012-03-28 ENCOUNTER — Encounter: Payer: Self-pay | Admitting: Family Medicine

## 2012-03-28 DIAGNOSIS — M79673 Pain in unspecified foot: Secondary | ICD-10-CM | POA: Insufficient documentation

## 2012-03-28 NOTE — Assessment & Plan Note (Signed)
With R foot surgery planned with Dr. Gwyneth Revels with [facility].  I do not "clear" a patient, but she is appropriately low risk for the surgery.  The benefits appear to greatly outweigh the risks.  She should do well.  Already off aspirin.  Will defer preop labs to Dr. Ether Griffins.   There was a question of post of Afib prev.  She never had this documented and would not need specific attention other than routine monitoring. >25 min spent with face to face with patient, >50% counseling and/or coordinating care.

## 2012-03-28 NOTE — Progress Notes (Signed)
Surgery planned 04/09/12/ for foot pain, hammertoe.  H/o mult surgeries, has been intubated prev and tolerated.  No h/o MI CHF DVT PE CAD stent CABG.  Good exercise tolerance, can walk several miles.  Not SOB.  No BLE edema.  Her QOL would be much improved with surgery to relieve pain.  We discussed her risk status.    Prev with cards eval for possible AF.  This was never verified.  Prev cards notes EKG and echo reviewed.    PMH and SH reviewed  ROS: See HPI, otherwise noncontributory.  Meds, vitals, and allergies reviewed.   GEN: nad, alert and oriented HEENT: mucous membranes moist NECK: supple w/o LA CV: rrr. PULM: ctab, no inc wob ABD: soft, +bs EXT: no edema SKIN: no acute rash

## 2012-04-11 ENCOUNTER — Other Ambulatory Visit: Payer: Self-pay | Admitting: *Deleted

## 2012-04-11 NOTE — Telephone Encounter (Signed)
Is this ok to refill?  

## 2012-04-12 MED ORDER — FIRST-DUKES MOUTHWASH MT SUSP: 15.0000 mL | Freq: Four times a day (QID) | OROMUCOSAL | Status: DC | PRN

## 2012-04-12 NOTE — Telephone Encounter (Signed)
Sent!

## 2012-04-20 ENCOUNTER — Encounter: Payer: Self-pay | Admitting: Family Medicine

## 2012-04-29 ENCOUNTER — Telehealth: Payer: Self-pay

## 2012-04-29 NOTE — Telephone Encounter (Signed)
[facility] left v/m requesting rx faxed for 6 surgical bras and 2 silicone breast prothesis.Please advise.

## 2012-04-30 NOTE — Telephone Encounter (Signed)
Please fax this note as an order for 6 surgical bras and 2 silicone breast prothesis.   H/o breast cancer/mastectomy.  Dx 174.9.

## 2012-04-30 NOTE — Telephone Encounter (Signed)
Note faxed to [facility]. Spoke to [facility] and was advised that the note was received.

## 2012-07-02 ENCOUNTER — Ambulatory Visit (INDEPENDENT_AMBULATORY_CARE_PROVIDER_SITE_OTHER): Payer: Medicare Other | Admitting: Family Medicine

## 2012-07-02 ENCOUNTER — Encounter: Payer: Self-pay | Admitting: Family Medicine

## 2012-07-02 VITALS — BP 142/90 | HR 76 | Temp 97.8°F | Wt 176.0 lb

## 2012-07-02 DIAGNOSIS — E785 Hyperlipidemia, unspecified: Secondary | ICD-10-CM

## 2012-07-02 NOTE — Progress Notes (Signed)
She is back to dating for the first time since she was widowed.  This has been a big change for the better.  Her mood is improved with this change.    H/o HLD, statin intolerant.  She had been going to GI recently about gas and was put on a diet by GI (similar to the cholesterol diet we prev discussed).  She's working on her diet "and it's easier to do when you're eating with someone."  We discussed rechecking lipids later in 2014.  She was asking about getting a second flu shot.  She had it done initially in fall 2013.  She had the flu earlier this flu season.  Guidelines discussed.   Meds, vitals, and allergies reviewed.   ROS: See HPI.  Otherwise, noncontributory.  nad ncat Mmm rrr ctab abd with normal BS Ext with trace edema Affect bright. Speech wnl. Judgement intact.

## 2012-07-02 NOTE — Patient Instructions (Addendum)
Don't change your meds for now.  Recheck labs before a visit in summer of 2014.  Take care.

## 2012-07-02 NOTE — Assessment & Plan Note (Signed)
Would work on diet and recheck labs later in 2014.  She agrees.  Her social situation is improved.  She wouldn't need a second flu shot by the guidelines.   F/u prn in meantime.

## 2012-07-08 ENCOUNTER — Telehealth: Payer: Self-pay | Admitting: Family Medicine

## 2012-07-08 ENCOUNTER — Ambulatory Visit (INDEPENDENT_AMBULATORY_CARE_PROVIDER_SITE_OTHER): Payer: Medicare Other | Admitting: Family Medicine

## 2012-07-08 ENCOUNTER — Encounter: Payer: Self-pay | Admitting: Family Medicine

## 2012-07-08 VITALS — BP 130/76 | HR 92 | Temp 97.9°F | Wt 179.0 lb

## 2012-07-08 DIAGNOSIS — J019 Acute sinusitis, unspecified: Secondary | ICD-10-CM

## 2012-07-08 MED ORDER — AMOXICILLIN-POT CLAVULANATE 875-125 MG PO TABS: 1.0000 | ORAL_TABLET | Freq: Two times a day (BID) | ORAL | Status: DC

## 2012-07-08 NOTE — Progress Notes (Signed)
She had f/u with the eye clinic today.    She had a cold.  Sx started since last week.  A few days ago she started having a scratchy throat.  Has been using OTC throat spray.  She has a 'froggy' throat and voice change.  Face is stuffy and 'my head feels big.'  No fevers now, she thought she had a fever a few days ago.  Cough, sputum production started today.  She had a flu shot prev.  Facial pain noted, frontal B pain. Rhinorrhea noted.    This is the 3rd time she's been sick since xmas 2013.   Meds, vitals, and allergies reviewed.   ROS: See HPI.  Otherwise, noncontributory.  GEN: nad, alert and oriented HEENT: mucous membranes moist, tm w/o erythema, nasal exam w/o erythema, clear discharge noted,  OP with cobblestoning, frontal sinuses ttp NECK: supple w/o LA CV: rrr.   PULM: ctab, no inc wob EXT: no edema SKIN: no acute rash

## 2012-07-08 NOTE — Patient Instructions (Addendum)
Keep using nasal saline and start the antibiotics today.  Take care.  This should gradually improve.

## 2012-07-08 NOTE — Telephone Encounter (Signed)
Patient Information:  Caller Name: Khamya  Phone: [phone]  Patient: Olivia [name]  Gender: Female  DOB: [birth year]  Age: 72 Years  PCP: Crawford Givens Clelia Croft) [facility])  Office Follow Up:  Does the office need to follow up with this patient?: No  Instructions For The Office: N/A   Symptoms  Reason For Call & Symptoms: Started with Congestion, cough and sore throat - onset 07/05/12. She has a tight dry cough and chest feels tight-  some wheezing on 07/07/12.  Today she woke up and voice was hoarse-07/08/12. Runny nose.  Reviewed Health History In EMR: Yes  Reviewed Medications In EMR: Yes  Reviewed Allergies In EMR: Yes  Reviewed Surgeries / Procedures: Yes  Date of Onset of Symptoms: 07/05/2012  Treatments Tried: Choloroseptic gargle, Robitussin dM, Throat spray, Tylenol.  Treatments Tried Worked: No  Guideline(s) Used:  Cough  Disposition Per Guideline:   Go to Office Now  Reason For Disposition Reached:   Wheezing is present  Advice Given:  Cough Medicines:  OTC Cough Syrups: The most common cough suppressant in OTC cough medications is dextromethorphan. Often the letters "DM" appear in the name.  OTC Cough Drops: Cough drops can help a lot, especially for mild coughs. They reduce coughing by soothing your irritated throat and removing that tickle sensation in the back of the throat. Cough drops also have the advantage of portability - you can carry them with you.  Home Remedy - Honey: This old home remedy has been shown to help decrease coughing at night. The adult dosage is 2 teaspoons (10 ml) at bedtime. Honey should not be given to infants under one year of age.  OTC Cough Syrup - Dextromethorphan:  Cough syrups containing the cough suppressant dextromethorphan (DM) may help decrease your cough. Cough syrups work best for coughs that keep you awake at night. They can also sometimes help in the late stages of a respiratory infection when the cough is dry and hacking.  They can be used along with cough drops.  Coughing Spasms:  Drink warm fluids. Inhale warm mist (Reason: both relax the airway and loosen up the phlegm).  Suck on cough drops or hard candy to coat the irritated throat.  Prevent Dehydration:  Drink adequate liquids.  This will help soothe an irritated or dry throat and loosen up the phlegm.  Avoid Tobacco Smoke:  Smoking or being exposed to smoke makes coughs much worse.  Coughing Spasms:  Drink warm fluids. Inhale warm mist (Reason: both relax the airway and loosen up the phlegm).  Suck on cough drops or hard candy to coat the irritated throat.  Expected Course:   The expected course depends on what is causing the cough.  Viral bronchitis (chest cold) causes a cough that lasts 1 to 3 weeks. Sometimes you may cough up lots of phlegm (sputum, mucus). The mucus can normally be white, gray, yellow, or green.  Call Back If:  Difficulty breathing  Cough lasts more than 3 weeks  Fever lasts > 3 days  You become worse.  For a Runny Nose With Profuse Discharge:   Nasal mucus and discharge help to wash viruses and bacteria out of the nose and sinuses.  Blowing the nose is all that is needed.  If the skin around your nostrils gets irritated, apply a tiny amount of petroleum ointment to the nasal openings once or twice a day.  Contagiousness:  The cold virus is present in your nasal secretions.  Cover your nose  and mouth with a tissue when you sneeze or cough.  Wash your hands frequently with soap and water.  You can return to work or school after the fever is gone and you feel well enough to participate in normal activities.  Call Back If:  Difficulty breathing occurs  Fever lasts more than 3 days  Nasal discharge lasts more than 10 days  Cough lasts more than 3 weeks  You become worse  Appointment Scheduled:  07/08/2012 16:00:00 Appointment Scheduled Provider:  Crawford Givens Clelia Croft) [facility]) She has a 1:30 pm appointment with  Optometrist and cannot come sooner.

## 2012-07-09 DIAGNOSIS — J019 Acute sinusitis, unspecified: Secondary | ICD-10-CM | POA: Insufficient documentation

## 2012-07-09 HISTORY — DX: Acute sinusitis, unspecified: J01.90

## 2012-07-09 NOTE — Assessment & Plan Note (Signed)
Nontoxic, start augmentin and f/u prn.  Use nasal saline, call back as needed.  Supportive tx o/w.  She agrees.

## 2012-07-14 ENCOUNTER — Telehealth: Payer: Self-pay

## 2012-07-14 NOTE — Telephone Encounter (Signed)
Gayle RN with [employer] left v/m;Gayle has been unable to reach pt and wanted to know if pt was in hospital or if we knew status of pt. I called pt's home # and spoke with pt she is OK and has left v/m for Gayle to call pt back. Gayle notified as instructed.

## 2012-07-31 ENCOUNTER — Telehealth: Payer: Self-pay | Admitting: Family Medicine

## 2012-07-31 MED ORDER — DOXYCYCLINE HYCLATE 100 MG PO TABS: 100.0000 mg | ORAL_TABLET | Freq: Two times a day (BID) | ORAL | Status: DC

## 2012-07-31 NOTE — Telephone Encounter (Signed)
Patient Information:  Caller Name: Kesley  Phone: [phone]  Patient: Olivia [name], Olivia [name]  Gender: Female  DOB: December 11, 1940  Age: 72 Years  PCP: Crawford Givens Clelia Croft) [facility])  Office Follow Up:  Does the office need to follow up with this patient?: Yes  Instructions For The Office: Requesting antibiotic  RN Note:  Uses [facility] in [place]. Has already taken Amoxicillin for 10 days after the flu/stomach virus.  Symptoms  Reason For Call & Symptoms: Had cold and flu and end of December 2013 and still has annoying cough. Seen in office two weeks ago and taking Muscinx and Delsym daily and not helping. She is waking up at night coughing and having spells to the point of gagging. Stopped going to church and [employer]. She is coughing up greyish greenish mucos. Requesting antibiotic ( not Amoxicillin- took already and did not help)  Reviewed Health History In EMR: Yes  Reviewed Medications In EMR: Yes  Reviewed Allergies In EMR: Yes  Reviewed Surgeries / Procedures: Yes  Date of Onset of Symptoms: 05/21/2012  Treatments Tried: Cough Drops, Delsym, Muscinex, [facility] Mucus extended release  Treatments Tried Worked: No  Guideline(s) Used:  Cough  Disposition Per Guideline:   See Today in Office  Reason For Disposition Reached:   Severe coughing spells (e.g., whooping sound after coughing, vomiting after coughing)  Advice Given:  Cough Medicines:  OTC Cough Syrups: The most common cough suppressant in OTC cough medications is dextromethorphan. Often the letters "DM" appear in the name.  OTC Cough Drops: Cough drops can help a lot, especially for mild coughs. They reduce coughing by soothing your irritated throat and removing that tickle sensation in the back of the throat. Cough drops also have the advantage of portability - you can carry them with you.  Home Remedy - Honey: This old home remedy has been shown to help decrease coughing at night. The adult dosage is 2 teaspoons (10  ml) at bedtime. Honey should not be given to infants under one year of age.  Prevent Dehydration:  Drink adequate liquids.  This will help soothe an irritated or dry throat and loosen up the phlegm.  Call Back If:  Difficulty breathing  Cough lasts more than 3 weeks  Fever lasts > 3 days  You become worse.  Patient Refused Recommendation:  Patient Requests Prescription  Requesting antibiotic and will come in for OV on 08/04/12 if not improving.

## 2012-07-31 NOTE — Telephone Encounter (Signed)
Sent rx for doxycycline

## 2012-08-19 ENCOUNTER — Encounter: Payer: Self-pay | Admitting: Family Medicine

## 2012-08-19 ENCOUNTER — Telehealth: Payer: Self-pay | Admitting: Family Medicine

## 2012-08-19 ENCOUNTER — Ambulatory Visit (INDEPENDENT_AMBULATORY_CARE_PROVIDER_SITE_OTHER): Payer: Medicare Other | Admitting: Family Medicine

## 2012-08-19 VITALS — BP 134/80 | HR 81 | Temp 97.7°F | Wt 173.0 lb

## 2012-08-19 DIAGNOSIS — S61209A Unspecified open wound of unspecified finger without damage to nail, initial encounter: Secondary | ICD-10-CM

## 2012-08-19 DIAGNOSIS — Z23 Encounter for immunization: Secondary | ICD-10-CM

## 2012-08-19 DIAGNOSIS — S61012A Laceration without foreign body of left thumb without damage to nail, initial encounter: Secondary | ICD-10-CM

## 2012-08-19 NOTE — Progress Notes (Signed)
She accidentally stuck her L hand near the thumb (palmar side) with a knife last night.  She has some wax on the knife but it was otherwise clean. It took 2 hours to stop the bleeding last night. Tetanus was last done in 2008.  She cleaned the area last night.    Meds, vitals, and allergies reviewed.   ROS: See HPI.  Otherwise, noncontributory.  nad L hand with totally normal inspection except for 5mm longitudinal and clean, nonbleeding and well opposed laceration on the palmar side of the thumb.  Distally nv intact and no tendon deficit.  Not ttp

## 2012-08-19 NOTE — Patient Instructions (Addendum)
Keep it clean and covered and this should heal.  If you have significant pain, spreading redness or draining pus then notify the clinic.

## 2012-08-19 NOTE — Telephone Encounter (Signed)
Patient Information:  Caller Name: [name] [phone])  Phone: [phone]  Patient: Olivia [name], Olivia [name]  Gender: Female  DOB: [birth year]  Age: 72 Years  PCP: Crawford Givens Clelia Croft) [facility])  Office Follow Up:  Does the office need to follow up with this patient?: No  Instructions For The Office: N/A  RN Note:  last tetanus 08/12/2006; pt reports it took over 2 hours to stop bleeding.  Pt reports that there is a bruise where the bandage was on tight.  Pt denies any pain  Symptoms  Reason For Call & Symptoms: pt reports she stuck a steak knife in her hand (approx 2130).  Pt reports it went in about an inch between thumb and first finger.  Pt was able to get wound to stop bleeding  Reviewed Health History In EMR: Yes  Reviewed Medications In EMR: Yes  Reviewed Allergies In EMR: Yes  Reviewed Surgeries / Procedures: Yes  Date of Onset of Symptoms: 08/18/2012  Guideline(s) Used:  Hand and Wrist Injury  Disposition Per Guideline:   See Today or Tomorrow in Office  Reason For Disposition Reached:   Wound and no tetanus booster in > 5 years (Or greater than 10 years for clean cuts)  Advice Given:  Apply a Cold Pack:  Apply a cold pack or an ice bag (wrapped in a moist towel) to the area for 20 minutes. Repeat in 1 hour, then every 4 hours while awake.  Call Back If:  Pain becomes severe  You become worse.  Patient Will Follow Care Advice:  YES  Appointment Scheduled:  08/19/2012 15:00:00 Appointment Scheduled Provider:  Crawford Givens Clelia Croft) [facility])

## 2012-08-20 DIAGNOSIS — S61019A Laceration without foreign body of unspecified thumb without damage to nail, initial encounter: Secondary | ICD-10-CM | POA: Insufficient documentation

## 2012-08-20 NOTE — Assessment & Plan Note (Signed)
No need to close.  Cover and keep clean.  F/u prn.  Tetanus updated.

## 2012-08-29 ENCOUNTER — Emergency Department: Payer: Self-pay | Admitting: Emergency Medicine

## 2012-08-29 LAB — URINALYSIS, COMPLETE
Bilirubin,UR: NEGATIVE
Protein: NEGATIVE
RBC,UR: 65 /HPF (ref 0–5)
Squamous Epithelial: 1

## 2012-08-29 LAB — COMPREHENSIVE METABOLIC PANEL
Anion Gap: 7 (ref 7–16)
BUN: 16 mg/dL (ref 7–18)
EGFR (African American): >60
Glucose: 85 mg/dL (ref 65–99)
Potassium: 3.9 mmol/L (ref 3.5–5.1)
SGPT (ALT): 29 U/L (ref 12–78)
Sodium: 142 mmol/L (ref 136–145)
Total Protein: 6.8 g/dL (ref 6.4–8.2)

## 2012-08-29 LAB — CBC
HCT: 40.1 % (ref 35.0–47.0)
HGB: 13.6 g/dL (ref 12.0–16.0)
MCH: 32.7 pg (ref 26.0–34.0)
Platelet: 257 10*3/uL (ref 150–440)

## 2012-08-31 LAB — URINE CULTURE

## 2012-09-01 ENCOUNTER — Other Ambulatory Visit: Payer: Self-pay | Admitting: *Deleted

## 2012-09-01 MED ORDER — LEVOTHYROXINE SODIUM 50 MCG PO TABS: 50.0000 ug | ORAL_TABLET | Freq: Every day | ORAL | Status: DC

## 2012-09-29 HISTORY — PX: CARDIOVASCULAR STRESS TEST: SHX262

## 2013-01-08 ENCOUNTER — Ambulatory Visit: Payer: Self-pay | Admitting: Gastroenterology

## 2013-01-09 LAB — PATHOLOGY REPORT

## 2013-02-20 ENCOUNTER — Telehealth: Payer: Self-pay

## 2013-02-20 NOTE — Telephone Encounter (Signed)
Pt left v/m requesting cb about getting pneumonia vaccine. I called pt back and she already has appt scheduled for pneumonia and flu shot on 02/26/13.

## 2013-02-24 ENCOUNTER — Ambulatory Visit: Payer: Medicare Other

## 2013-02-25 ENCOUNTER — Ambulatory Visit: Payer: Medicare Other

## 2013-02-26 ENCOUNTER — Ambulatory Visit: Payer: Medicare Other

## 2013-04-15 ENCOUNTER — Other Ambulatory Visit: Payer: Self-pay | Admitting: Gastroenterology

## 2013-04-15 DIAGNOSIS — R109 Unspecified abdominal pain: Secondary | ICD-10-CM

## 2013-04-21 ENCOUNTER — Ambulatory Visit
Admission: RE | Admit: 2013-04-21 | Discharge: 2013-04-21 | Disposition: A | Discharge status: Home / Self Care | Payer: Medicare Other | Source: Ambulatory Visit | Attending: Gastroenterology | Admitting: Gastroenterology

## 2013-04-21 DIAGNOSIS — R109 Unspecified abdominal pain: Secondary | ICD-10-CM

## 2013-04-21 MED ORDER — IOHEXOL 300 MG/ML  SOLN
100.0000 mL | Freq: Once | INTRAMUSCULAR | Status: AC | PRN
  Administered 2013-04-21: 100 mL via INTRAVENOUS

## 2013-05-05 ENCOUNTER — Ambulatory Visit (INDEPENDENT_AMBULATORY_CARE_PROVIDER_SITE_OTHER): Payer: Self-pay | Admitting: Surgery

## 2013-05-08 ENCOUNTER — Encounter (INDEPENDENT_AMBULATORY_CARE_PROVIDER_SITE_OTHER): Payer: Self-pay

## 2013-05-08 ENCOUNTER — Encounter (INDEPENDENT_AMBULATORY_CARE_PROVIDER_SITE_OTHER): Payer: Self-pay | Admitting: Surgery

## 2013-05-08 ENCOUNTER — Ambulatory Visit (INDEPENDENT_AMBULATORY_CARE_PROVIDER_SITE_OTHER): Payer: Medicare Other | Admitting: Surgery

## 2013-05-08 VITALS — BP 130/84 | HR 92 | Temp 98.4°F | Resp 15 | Ht 64.0 in | Wt 173.2 lb

## 2013-05-08 DIAGNOSIS — K432 Incisional hernia without obstruction or gangrene: Secondary | ICD-10-CM

## 2013-05-08 DIAGNOSIS — R109 Unspecified abdominal pain: Secondary | ICD-10-CM
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
## 2013-05-08 NOTE — Patient Instructions (Signed)
Hernia Repair with Laparoscope A hernia occurs when an internal organ pushes out through a weak spot in the belly (abdominal) wall muscles. Hernias most commonly occur in the groin and around the navel. Hernias can also occur through a cut by the surgeon (incision) after an abdominal operation. A hernia may be caused by:  Lifting heavy objects.  Prolonged coughing.  Straining to move your bowels. Hernias can often be pushed back into place (reduced). Most hernias tend to get worse over time. Problems occur when abdominal contents get stuck in the opening and the blood supply is blocked or impaired (incarcerated hernia). Because of these risks, you require surgery to repair the hernia. Your hernia will be repaired using a laparoscope. Laparoscopic surgery is a type of minimally invasive surgery. It does not involve making a typical surgical cut (incision) in the skin. A laparoscope is a telescope-like rod and lens system. It is usually connected to a video camera and a light source so your caregiver can clearly see the operative area. The instruments are inserted through  to  inch (5 mm or 10 mm) openings in the skin at specific locations. A working and viewing space is created by blowing a small amount of carbon dioxide gas into the abdominal cavity. The abdomen is essentially blown up like a balloon (insufflated). This elevates the abdominal wall above the internal organs like a dome. The carbon dioxide gas is common to the human body and can be absorbed by tissue and removed by the respiratory system. Once the repair is completed, the small incisions will be closed with either stitches (sutures) or staples (just like a paper stapler only this staple holds the skin together). LET YOUR CAREGIVERS KNOW ABOUT:  Allergies.  Medications taken including herbs, eye drops, over the counter medications, and creams.  Use of steroids (by mouth or creams).  Previous problems with anesthetics or  Novocaine.  Possibility of pregnancy, if this applies.  History of blood clots (thrombophlebitis).  History of bleeding or blood problems.  Previous surgery.  Other health problems. BEFORE THE PROCEDURE  Laparoscopy can be done either in a hospital or out-patient clinic. You may be given a mild sedative to help you relax before the procedure. Once in the operating room, you will be given a general anesthesia to make you sleep (unless you and your caregiver choose a different anesthetic).  AFTER THE PROCEDURE  After the procedure you will be watched in a recovery area. Depending on what type of hernia was repaired, you might be admitted to the hospital or you might go home the same day. With this procedure you may have less pain and scarring. This usually results in a quicker recovery and less risk of infection. HOME CARE INSTRUCTIONS   Bed rest is not required. You may continue your normal activities but avoid heavy lifting (more than 10 pounds) or straining.  Cough gently. If you are a smoker it is best to stop, as even the best hernia repair can break down with the continual strain of coughing.  Avoid driving until given the OK by your surgeon.  There are no dietary restrictions unless given otherwise.  TAKE ALL MEDICATIONS AS DIRECTED.  Only take over-the-counter or prescription medicines for pain, discomfort, or fever as directed by your caregiver. SEEK MEDICAL CARE IF:   There is increasing abdominal pain or pain in your incisions.  There is more bleeding from incisions, other than minimal spotting.  You feel light headed or faint.  You   develop an unexplained fever, chills, and/or an oral temperature above 102 F (38.9 C).  You have redness, swelling, or increasing pain in the wound.  Pus coming from wound.  A foul smell coming from the wound or dressings. SEEK IMMEDIATE MEDICAL CARE IF:   You develop a rash.  You have difficulty breathing.  You have any  allergic problems. MAKE SURE YOU:   Understand these instructions.  Will watch your condition.  Will get help right away if you are not doing well or get worse. Document Released: 05/14/2005 Document Revised: 08/06/2011 Document Reviewed: 04/13/2009 [employer] Patient Information 2014 [employer].  

## 2013-05-19 ENCOUNTER — Encounter (HOSPITAL_COMMUNITY): Payer: Self-pay | Admitting: Pharmacy Technician

## 2013-05-20 NOTE — Pre-Procedure Instructions (Signed)
Olivia [name]  05/20/2013   Your procedure is scheduled on:  Tuesday, January 6th              [facility] Judee Clara Parking is available)  Report to [facility]  2 * 3 at  12:30 PM  Call this number if you have problems the morning of surgery: [phone]   Remember:   Do not eat food or drink liquids after midnight Monday.   Take these medicines the morning of surgery with A SIP OF WATER: Xanax, Cymbalta, Levothyroxine, Omeprazole   Do not wear jewelry, make-up or nail polish.  Do not wear lotions, powders, or perfumes. You may NOT wear deodorant.  Do not shave underarms & legs 48 hours prior to surgery.    Do not bring valuables to the hospital.  [facility] is not responsible for any belongings or valuables.               Contacts, dentures or bridgework may not be worn into surgery.  Leave suitcase in the car. After surgery it may be brought to your room.  For patients admitted to the hospital, discharge time is determined by your treatment team.               Patients discharged the day of surgery will not be allowed to drive home, and you will need a responsible adult to stay with you for 24 hrs afterwards.   Name and phone number of your driver:    Special Instructions: Shower using CHG 2 nights before surgery and the night before surgery.  If you shower the day of surgery use CHG.  Use special wash - you have one bottle of CHG for all showers.  You should use approximately 1/3 of the bottle for each shower.   Please read over the following fact sheets that you were given: Pain Booklet and Surgical Site Infection Prevention

## 2013-05-22 ENCOUNTER — Encounter (HOSPITAL_COMMUNITY)
Admission: RE | Admit: 2013-05-22 | Discharge: 2013-05-22 | Disposition: A | Discharge status: Home / Self Care | Payer: Medicare Other | Source: Ambulatory Visit | Attending: Surgery | Admitting: Surgery

## 2013-05-22 ENCOUNTER — Encounter (HOSPITAL_COMMUNITY): Payer: Self-pay

## 2013-05-22 DIAGNOSIS — Z01810 Encounter for preprocedural cardiovascular examination: Secondary | ICD-10-CM | POA: Insufficient documentation

## 2013-05-22 DIAGNOSIS — Z01818 Encounter for other preprocedural examination: Secondary | ICD-10-CM | POA: Insufficient documentation

## 2013-05-22 DIAGNOSIS — Z01811 Encounter for preprocedural respiratory examination: Secondary | ICD-10-CM | POA: Insufficient documentation

## 2013-05-22 DIAGNOSIS — Z01812 Encounter for preprocedural laboratory examination: Secondary | ICD-10-CM | POA: Insufficient documentation

## 2013-05-22 LAB — COMPREHENSIVE METABOLIC PANEL
ALT: 22 U/L (ref 0–35)
AST: 29 U/L (ref 0–37)
Albumin: 4.1 g/dL (ref 3.5–5.2)
Alkaline Phosphatase: 81 U/L (ref 39–117)
BUN: 17 mg/dL (ref 6–23)
CO2: 27 mEq/L (ref 19–32)
Calcium: 9.1 mg/dL (ref 8.4–10.5)
Chloride: 103 mEq/L (ref 96–112)
Creatinine, Ser: 0.56 mg/dL (ref 0.50–1.10)
GFR calc non Af Amer: >90 mL/min (ref >90)
Glucose, Bld: 94 mg/dL (ref 70–99)
Sodium: 140 mEq/L (ref 135–145)
Total Bilirubin: 0.3 mg/dL (ref 0.3–1.2)

## 2013-05-22 LAB — CBC WITH DIFFERENTIAL/PLATELET
Basophils Absolute: 0.1 10*3/uL (ref 0.0–0.1)
Eosinophils Absolute: 0.3 10*3/uL (ref 0.0–0.7)
Eosinophils Relative: 4 % (ref 0–5)
HCT: 41.6 % (ref 36.0–46.0)
Hemoglobin: 14.1 g/dL (ref 12.0–15.0)
Lymphocytes Relative: 28 % (ref 12–46)
Lymphs Abs: 1.9 10*3/uL (ref 0.7–4.0)
MCH: 33.2 pg (ref 26.0–34.0)
MCV: 97.9 fL (ref 78.0–100.0)
Monocytes Absolute: 0.4 10*3/uL (ref 0.1–1.0)
Monocytes Relative: 5 % (ref 3–12)
Platelets: 251 10*3/uL (ref 150–400)
RBC: 4.25 MIL/uL (ref 3.87–5.11)
RDW: 12.5 % (ref 11.5–15.5)
WBC: 6.9 10*3/uL (ref 4.0–10.5)

## 2013-05-22 MED ORDER — CHLORHEXIDINE GLUCONATE 4 % EX LIQD: 1.0000 "application " | Freq: Once | CUTANEOUS | Status: DC

## 2013-05-25 ENCOUNTER — Encounter (HOSPITAL_COMMUNITY): Payer: Self-pay
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
## 2013-06-01 MED ORDER — DEXTROSE 5 % IV SOLN
3.0000 g | INTRAVENOUS | Status: AC
  Administered 2013-06-02: 3 g via INTRAVENOUS
  Filled 2013-06-01: qty 3000

## 2013-06-02 ENCOUNTER — Ambulatory Visit (HOSPITAL_COMMUNITY)
Admission: RE | Admit: 2013-06-02 | Discharge: 2013-06-02 | Disposition: A | Discharge status: Home / Self Care | Payer: Medicare Other | Source: Ambulatory Visit | Attending: Surgery | Admitting: Surgery

## 2013-06-02 ENCOUNTER — Encounter (HOSPITAL_COMMUNITY)
Admission: RE | Disposition: A | Discharge status: Home / Self Care | Payer: Self-pay | Source: Ambulatory Visit | Attending: Surgery

## 2013-06-02 ENCOUNTER — Encounter (HOSPITAL_COMMUNITY): Payer: Medicare Other | Admitting: Vascular Surgery

## 2013-06-02 ENCOUNTER — Inpatient Hospital Stay (HOSPITAL_COMMUNITY): Payer: Medicare Other | Admitting: Anesthesiology

## 2013-06-02 ENCOUNTER — Encounter (HOSPITAL_COMMUNITY): Payer: Self-pay | Admitting: *Deleted

## 2013-06-02 DIAGNOSIS — N189 Chronic kidney disease, unspecified: Secondary | ICD-10-CM | POA: Insufficient documentation

## 2013-06-02 DIAGNOSIS — Z79899 Other long term (current) drug therapy: Secondary | ICD-10-CM | POA: Insufficient documentation

## 2013-06-02 DIAGNOSIS — Z853 Personal history of malignant neoplasm of breast: Secondary | ICD-10-CM | POA: Insufficient documentation

## 2013-06-02 DIAGNOSIS — IMO0001 Reserved for inherently not codable concepts without codable children: Secondary | ICD-10-CM | POA: Insufficient documentation

## 2013-06-02 DIAGNOSIS — R112 Nausea with vomiting, unspecified: Secondary | ICD-10-CM

## 2013-06-02 DIAGNOSIS — R109 Unspecified abdominal pain: Secondary | ICD-10-CM

## 2013-06-02 DIAGNOSIS — E039 Hypothyroidism, unspecified: Secondary | ICD-10-CM | POA: Insufficient documentation

## 2013-06-02 DIAGNOSIS — Z7982 Long term (current) use of aspirin: Secondary | ICD-10-CM | POA: Insufficient documentation

## 2013-06-02 DIAGNOSIS — I129 Hypertensive chronic kidney disease with stage 1 through stage 4 chronic kidney disease, or unspecified chronic kidney disease: Secondary | ICD-10-CM | POA: Insufficient documentation

## 2013-06-02 DIAGNOSIS — R1013 Epigastric pain: Secondary | ICD-10-CM | POA: Insufficient documentation

## 2013-06-02 DIAGNOSIS — J45909 Unspecified asthma, uncomplicated: Secondary | ICD-10-CM | POA: Insufficient documentation

## 2013-06-02 DIAGNOSIS — Z9010 Acquired absence of unspecified breast and nipple: Secondary | ICD-10-CM | POA: Insufficient documentation

## 2013-06-02 DIAGNOSIS — E785 Hyperlipidemia, unspecified: Secondary | ICD-10-CM | POA: Insufficient documentation

## 2013-06-02 DIAGNOSIS — K660 Peritoneal adhesions (postprocedural) (postinfection): Secondary | ICD-10-CM

## 2013-06-02 DIAGNOSIS — K219 Gastro-esophageal reflux disease without esophagitis: Secondary | ICD-10-CM | POA: Insufficient documentation

## 2013-06-02 DIAGNOSIS — M1990 Unspecified osteoarthritis, unspecified site: Secondary | ICD-10-CM | POA: Insufficient documentation

## 2013-06-02 DIAGNOSIS — F319 Bipolar disorder, unspecified: Secondary | ICD-10-CM | POA: Insufficient documentation

## 2013-06-02 DIAGNOSIS — J309 Allergic rhinitis, unspecified: Secondary | ICD-10-CM | POA: Insufficient documentation

## 2013-06-02 DIAGNOSIS — I4891 Unspecified atrial fibrillation: Secondary | ICD-10-CM | POA: Insufficient documentation

## 2013-06-02 DIAGNOSIS — M069 Rheumatoid arthritis, unspecified: Secondary | ICD-10-CM | POA: Insufficient documentation

## 2013-06-02 DIAGNOSIS — G7000 Myasthenia gravis without (acute) exacerbation: Secondary | ICD-10-CM | POA: Insufficient documentation

## 2013-06-02 HISTORY — PX: LAPAROSCOPY: SHX197

## 2013-06-02 HISTORY — PX: LAPAROSCOPIC LYSIS OF ADHESIONS: SHX5905

## 2013-06-02 SURGERY — LAPAROSCOPY, DIAGNOSTIC
Anesthesia: General | Site: Abdomen

## 2013-06-02 MED ORDER — ROCURONIUM BROMIDE 100 MG/10ML IV SOLN
INTRAVENOUS | Status: DC | PRN
  Administered 2013-06-02: 20 mg via INTRAVENOUS

## 2013-06-02 MED ORDER — GLYCOPYRROLATE 0.2 MG/ML IJ SOLN
INTRAMUSCULAR | Status: DC | PRN
  Administered 2013-06-02: .3 mg via INTRAVENOUS

## 2013-06-02 MED ORDER — LACTATED RINGERS IV SOLN
INTRAVENOUS | Status: DC | PRN
  Administered 2013-06-02 (×2): via INTRAVENOUS

## 2013-06-02 MED ORDER — LIDOCAINE HCL (CARDIAC) 20 MG/ML IV SOLN
INTRAVENOUS | Status: DC | PRN
  Administered 2013-06-02: 100 mg via INTRAVENOUS

## 2013-06-02 MED ORDER — MORPHINE SULFATE 2 MG/ML IJ SOLN
2.0000 mg | INTRAMUSCULAR | Status: DC | PRN
Start: 2013-06-02 — End: 2013-06-02

## 2013-06-02 MED ORDER — METOCLOPRAMIDE HCL 5 MG/ML IJ SOLN
10.0000 mg | Freq: Once | INTRAMUSCULAR | Status: DC | PRN
Start: 2013-06-02 — End: 2013-06-02

## 2013-06-02 MED ORDER — PHENYLEPHRINE HCL 10 MG/ML IJ SOLN
INTRAMUSCULAR | Status: DC | PRN
  Administered 2013-06-02: 80 ug via INTRAVENOUS

## 2013-06-02 MED ORDER — BUPIVACAINE-EPINEPHRINE 0.25% -1:200000 IJ SOLN
INTRAMUSCULAR | Status: DC | PRN
  Administered 2013-06-02: 7 mL

## 2013-06-02 MED ORDER — OXYCODONE-ACETAMINOPHEN 5-325 MG PO TABS: 1.0000 | ORAL_TABLET | ORAL | Status: DC | PRN

## 2013-06-02 MED ORDER — KETOROLAC TROMETHAMINE 15 MG/ML IJ SOLN
15.0000 mg | Freq: Four times a day (QID) | INTRAMUSCULAR | Status: DC
  Filled 2013-06-02 (×4): qty 1

## 2013-06-02 MED ORDER — SODIUM CHLORIDE 0.9 % IJ SOLN: 3.0000 mL | Freq: Two times a day (BID) | INTRAMUSCULAR | Status: DC

## 2013-06-02 MED ORDER — HYDROMORPHONE HCL PF 1 MG/ML IJ SOLN
INTRAMUSCULAR | Status: AC
  Filled 2013-06-02: qty 1

## 2013-06-02 MED ORDER — PROPOFOL 10 MG/ML IV BOLUS
INTRAVENOUS | Status: DC | PRN
  Administered 2013-06-02: 150 mg via INTRAVENOUS

## 2013-06-02 MED ORDER — SODIUM CHLORIDE 0.9 % IV SOLN: 250.0000 mL | INTRAVENOUS | Status: DC | PRN

## 2013-06-02 MED ORDER — FENTANYL CITRATE 0.05 MG/ML IJ SOLN
INTRAMUSCULAR | Status: DC | PRN
  Administered 2013-06-02: 50 ug via INTRAVENOUS

## 2013-06-02 MED ORDER — DEXTROSE 5 % IV SOLN
INTRAVENOUS | Status: DC | PRN
  Administered 2013-06-02: 12:00:00 via INTRAVENOUS

## 2013-06-02 MED ORDER — OXYCODONE HCL 5 MG PO TABS: 5.0000 mg | ORAL_TABLET | ORAL | Status: DC | PRN

## 2013-06-02 MED ORDER — HYDROMORPHONE HCL PF 1 MG/ML IJ SOLN
0.2500 mg | INTRAMUSCULAR | Status: DC | PRN
  Administered 2013-06-02 (×2): 0.5 mg via INTRAVENOUS

## 2013-06-02 MED ORDER — LACTATED RINGERS IV SOLN
INTRAVENOUS | Status: DC
  Administered 2013-06-02: 10:00:00 via INTRAVENOUS

## 2013-06-02 MED ORDER — ACETAMINOPHEN 650 MG RE SUPP
650.0000 mg | RECTAL | Status: DC | PRN
Start: 2013-06-02 — End: 2013-06-02
  Filled 2013-06-02: qty 1

## 2013-06-02 MED ORDER — ONDANSETRON HCL 4 MG/2ML IJ SOLN
INTRAMUSCULAR | Status: DC | PRN
  Administered 2013-06-02: 4 mg via INTRAVENOUS

## 2013-06-02 MED ORDER — SODIUM CHLORIDE 0.9 % IJ SOLN: 3.0000 mL | INTRAMUSCULAR | Status: DC | PRN

## 2013-06-02 MED ORDER — ONDANSETRON HCL 4 MG/2ML IJ SOLN
4.0000 mg | Freq: Four times a day (QID) | INTRAMUSCULAR | Status: DC | PRN
  Filled 2013-06-02: qty 2

## 2013-06-02 MED ORDER — 0.9 % SODIUM CHLORIDE (POUR BTL) OPTIME
TOPICAL | Status: DC | PRN
  Administered 2013-06-02: 1000 mL

## 2013-06-02 MED ORDER — EPHEDRINE SULFATE 50 MG/ML IJ SOLN
INTRAMUSCULAR | Status: DC | PRN
  Administered 2013-06-02 (×2): 10 mg via INTRAVENOUS
  Administered 2013-06-02: 5 mg via INTRAVENOUS

## 2013-06-02 MED ORDER — OXYCODONE HCL 5 MG PO TABS: 5.0000 mg | ORAL_TABLET | Freq: Once | ORAL | Status: DC | PRN

## 2013-06-02 MED ORDER — NEOSTIGMINE METHYLSULFATE 1 MG/ML IJ SOLN
INTRAMUSCULAR | Status: DC | PRN
  Administered 2013-06-02: 2.5 mg via INTRAVENOUS

## 2013-06-02 MED ORDER — OXYCODONE HCL 5 MG/5ML PO SOLN: 5.0000 mg | Freq: Once | ORAL | Status: DC | PRN

## 2013-06-02 MED ORDER — ACETAMINOPHEN 325 MG PO TABS
650.0000 mg | ORAL_TABLET | ORAL | Status: DC | PRN
  Filled 2013-06-02: qty 2

## 2013-06-02 SURGICAL SUPPLY — 57 items
ADH SKN CLS APL DERMABOND .7 (GAUZE/BANDAGES/DRESSINGS) ×2
APPLIER CLIP ROT 10 11.4 M/L (STAPLE)
APR CLP MED LRG 11.4X10 (STAPLE)
BINDER ABD UNIV 12 45-62 (WOUND CARE) ×1 IMPLANT
BINDER ABDOMINAL 46IN 62IN (WOUND CARE)
BLADE SURG 10 STRL SS (BLADE) ×1 IMPLANT
BLADE SURG ROTATE 9660 (MISCELLANEOUS) IMPLANT
CANISTER SUCTION 2500CC (MISCELLANEOUS) IMPLANT
CHLORAPREP W/TINT 26ML (MISCELLANEOUS) ×4 IMPLANT
CLIP APPLIE ROT 10 11.4 M/L (STAPLE) IMPLANT
COVER SURGICAL LIGHT HANDLE (MISCELLANEOUS) ×4 IMPLANT
DERMABOND ADVANCED (GAUZE/BANDAGES/DRESSINGS) ×2
DERMABOND ADVANCED .7 DNX12 (GAUZE/BANDAGES/DRESSINGS) ×2 IMPLANT
DEVICE SECURE STRAP 25 ABSORB (INSTRUMENTS) ×1 IMPLANT
DEVICE TROCAR PUNCTURE CLOSURE (ENDOMECHANICALS) ×1 IMPLANT
DRAPE UTILITY 15X26 W/TAPE STR (DRAPE) ×8 IMPLANT
DRAPE WARM FLUID 44X44 (DRAPE) ×4 IMPLANT
ELECT CAUTERY BLADE 6.4 (BLADE) ×1 IMPLANT
ELECT REM PT RETURN 9FT ADLT (ELECTROSURGICAL) ×4
ELECTRODE REM PT RTRN 9FT ADLT (ELECTROSURGICAL) ×2 IMPLANT
GLOVE BIO SURGEON STRL SZ7 (GLOVE) ×3 IMPLANT
GLOVE BIO SURGEON STRL SZ7.5 (GLOVE) ×3 IMPLANT
GLOVE BIO SURGEON STRL SZ8 (GLOVE) ×4 IMPLANT
GLOVE BIOGEL PI IND STRL 7.0 (GLOVE) ×1 IMPLANT
GLOVE BIOGEL PI IND STRL 7.5 (GLOVE) ×1 IMPLANT
GLOVE BIOGEL PI IND STRL 8 (GLOVE) ×2 IMPLANT
GLOVE BIOGEL PI INDICATOR 7.0 (GLOVE) ×2
GLOVE BIOGEL PI INDICATOR 7.5 (GLOVE) ×2
GLOVE BIOGEL PI INDICATOR 8 (GLOVE) ×2
GOWN STRL NON-REIN LRG LVL3 (GOWN DISPOSABLE) ×12 IMPLANT
GOWN STRL REIN XL XLG (GOWN DISPOSABLE) ×4 IMPLANT
KIT BASIN OR (CUSTOM PROCEDURE TRAY) ×4 IMPLANT
KIT ROOM TURNOVER OR (KITS) ×4 IMPLANT
MARKER SKIN DUAL TIP RULER LAB (MISCELLANEOUS) ×1 IMPLANT
NDL SPNL 22GX3.5 QUINCKE BK (NEEDLE) ×1 IMPLANT
NEEDLE SPNL 22GX3.5 QUINCKE BK (NEEDLE) IMPLANT
NS IRRIG 1000ML POUR BTL (IV SOLUTION) ×4 IMPLANT
PAD ARMBOARD 7.5X6 YLW CONV (MISCELLANEOUS) ×4 IMPLANT
PENCIL BUTTON HOLSTER BLD 10FT (ELECTRODE) ×1 IMPLANT
SCALPEL HARMONIC ACE (MISCELLANEOUS) ×3 IMPLANT
SCISSORS LAP 5X35 DISP (ENDOMECHANICALS) IMPLANT
SET IRRIG TUBING LAPAROSCOPIC (IRRIGATION / IRRIGATOR) IMPLANT
SLEEVE ENDOPATH XCEL 5M (ENDOMECHANICALS) ×8 IMPLANT
SUT MON AB 4-0 PC3 18 (SUTURE) ×4 IMPLANT
SUT NOVA NAB DX-16 0-1 5-0 T12 (SUTURE) ×2 IMPLANT
SUT VIC AB 3-0 SH 18 (SUTURE) IMPLANT
TOWEL OR 17X24 6PK STRL BLUE (TOWEL DISPOSABLE) ×1 IMPLANT
TOWEL OR 17X26 10 PK STRL BLUE (TOWEL DISPOSABLE) ×4 IMPLANT
TRAY FOLEY CATH 16FR SILVER (SET/KITS/TRAYS/PACK) ×4 IMPLANT
TRAY LAPAROSCOPIC (CUSTOM PROCEDURE TRAY) ×4 IMPLANT
TROCAR XCEL 12X100 BLDLESS (ENDOMECHANICALS) IMPLANT
TROCAR XCEL BLUNT TIP 100MML (ENDOMECHANICALS) IMPLANT
TROCAR XCEL NON-BLD 11X100MML (ENDOMECHANICALS) IMPLANT
TROCAR XCEL NON-BLD 5MMX100MML (ENDOMECHANICALS) ×4 IMPLANT
TUBE CONNECTING 12'X1/4 (SUCTIONS)
TUBE CONNECTING 12X1/4 (SUCTIONS) IMPLANT
YANKAUER SUCT BULB TIP NO VENT (SUCTIONS) IMPLANT
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
## 2013-06-02 NOTE — Transfer of Care (Signed)
Immediate Anesthesia Transfer of Care Note  Patient: Olivia [name]  Procedure(s) Performed: Procedure(s): DIAGNOSTIC LAPAROSCOPY (N/A) LAPAROSCOPIC LYSIS OF ADHESIONS (N/A)  Patient Location: PACU  Anesthesia Type:General  Level of Consciousness: awake, alert , oriented and patient cooperative  Airway & Oxygen Therapy: Patient Spontanous Breathing and Patient connected to nasal cannula oxygen  Post-op Assessment: Report given to PACU RN and Post -op Vital signs reviewed and stable  Post vital signs: Reviewed and stable  Complications: No apparent anesthesia complications

## 2013-06-02 NOTE — Interval H&P Note (Signed)
History and Physical Interval Note:  06/02/2013 11:10 AM  Olivia [name]  has presented today for surgery, with the diagnosis of incisional hernia   The various methods of treatment have been discussed with the patient and family. After consideration of risks, benefits and other options for treatment, the patient has consented to  Procedure(s): LAPAROSCOPIC INCISIONAL HERNIA (N/A) as a surgical intervention .  The patient's history has been reviewed, patient examined, no change in status, stable for surgery.  I have reviewed the patient's chart and labs.  Questions were answered to the patient's satisfaction.     Anahita Cua A.

## 2013-06-02 NOTE — Brief Op Note (Signed)
06/02/2013  12:52 PM  PATIENT:  Olivia [name]  73 y.o. female  PRE-OPERATIVE DIAGNOSIS:  ABDOMINAL PAIN/INCISIONAL HERNIA  POST-OPERATIVE DIAGNOSIS:  ABDOMINAL PAIN/ADHESIONS   PROCEDURE:  Procedure(s): DIAGNOSTIC LAPAROSCOPY (N/A) LAPAROSCOPIC LYSIS OF ADHESIONS (N/A)  SURGEON:  Surgeon(s) and Role:    * Aarin Sparkman A. Janari Yamada, MD - Primary      ANESTHESIA:   local and general  EBL:  Total I/O In: 1050 [I.V.:1050] Out: -   BLOOD ADMINISTERED:none  DRAINS: none   LOCAL MEDICATIONS USED:  BUPIVICAINE   SPECIMEN:  No Specimen  DISPOSITION OF SPECIMEN:  N/A  COUNTS:  YES  TOURNIQUET:  * No tourniquets in log *  DICTATION: .Other Dictation: Dictation Number [id number]  PLAN OF CARE: Discharge to home after PACU  PATIENT DISPOSITION:  PACU - hemodynamically stable.   Delay start of Pharmacological VTE agent (>24hrs) due to surgical blood loss or risk of bleeding: not applicable

## 2013-06-02 NOTE — Anesthesia Preprocedure Evaluation (Signed)
Anesthesia Evaluation  Patient identified by MRN, date of birth, ID band Patient awake    Reviewed: Allergy & Precautions, H&P , NPO status , Patient's Chart, lab work & pertinent test results, reviewed documented beta blocker date and time   Airway Mallampati: II TM Distance: >3 FB Neck ROM: full    Dental   Pulmonary asthma , pneumonia -, resolved,  breath sounds clear to auscultation        Cardiovascular hypertension, + Peripheral Vascular Disease + dysrhythmias Atrial Fibrillation Rhythm:regular     Neuro/Psych PSYCHIATRIC DISORDERS Depression Bipolar Disorder  Neuromuscular disease    GI/Hepatic Neg liver ROS, PUD, GERD-  Medicated and Controlled,  Endo/Other  Hypothyroidism   Renal/GU Renal InsufficiencyRenal disease  negative genitourinary   Musculoskeletal  (+) Arthritis -, Fibromyalgia -  Abdominal   Peds  Hematology  (+) anemia ,   Anesthesia Other Findings See surgeon's H&P   Reproductive/Obstetrics negative OB ROS                           Anesthesia Physical Anesthesia Plan  ASA: III  Anesthesia Plan: General   Post-op Pain Management:    Induction: Intravenous  Airway Management Planned: Oral ETT  Additional Equipment:   Intra-op Plan:   Post-operative Plan: Extubation in OR  Informed Consent: I have reviewed the patients History and Physical, chart, labs and discussed the procedure including the risks, benefits and alternatives for the proposed anesthesia with the patient or authorized representative who has indicated his/her understanding and acceptance.   Dental Advisory Given  Plan Discussed with: CRNA and Surgeon  Anesthesia Plan Comments:         Anesthesia Quick Evaluation

## 2013-06-02 NOTE — Progress Notes (Signed)
Pt has allergy to Polyethyl states that it causes a rash.  Olivia [name] called in pharm about contraindication with LR and Polyethyl states that it ok to use LR.  Pt states that she has never had a reaction to any IV fluids, that her reaction was from deodorant.

## 2013-06-02 NOTE — Anesthesia Procedure Notes (Signed)
Procedure Name: Intubation Date/Time: 06/02/2013 11:44 AM Performed by: Terrill Mohr Pre-anesthesia Checklist: Patient identified, Emergency Drugs available, Suction available and Patient being monitored Patient Re-evaluated:Patient Re-evaluated prior to inductionOxygen Delivery Method: Circle system utilized Preoxygenation: Pre-oxygenation with 100% oxygen Intubation Type: IV induction Ventilation: Mask ventilation without difficulty Laryngoscope Size: Mac and 3 Grade View: Grade II Tube type: Oral Tube size: 7.5 mm Number of attempts: 1 Airway Equipment and Method: Video-laryngoscopy and Stylet Placement Confirmation: ETT inserted through vocal cords under direct vision,  breath sounds checked- equal and bilateral and positive ETCO2 Secured at: 21 (cm at teeth) cm Tube secured with: Tape Dental Injury: Teeth and Oropharynx as per pre-operative assessment

## 2013-06-02 NOTE — Discharge Instructions (Signed)
Diagnostic Laparoscopy Laparoscopy is a surgical procedure. It is used to diagnose and treat diseases inside the belly (abdomen). It is usually a brief, common, and relatively simple procedure. The laparoscopeis a thin, lighted, pencil-sized instrument. It is like a telescope. It is inserted into your abdomen through a small cut (incision). Your caregiver can look at the organs inside your body through this instrument. He or she can see if there is anything abnormal. Laparoscopy can be done either in a hospital or outpatient clinic. You may be given a mild sedative to help you relax before the procedure. Once in the operating room, you will be given a drug to make you sleep (general anesthesia). Laparoscopy usually lasts less than 1 hour. After the procedure, you will be monitored in a recovery area until you are stable and doing well. Once you are home, it will take 2 to 3 days to fully recover. RISKS AND COMPLICATIONS  Laparoscopy has relatively few risks. Your caregiver will discuss the risks with you before the procedure. Some problems that can occur include:  Infection.  Bleeding.  Damage to other organs.  Anesthetic side effects. PROCEDURE Once you receive anesthesia, your surgeon inflates the abdomen with a harmless gas (carbon dioxide). This makes the organs easier to see. The laparoscope is inserted into the abdomen through a small incision. This allows your surgeon to see into the abdomen. Other small instruments are also inserted into the abdomen through other small openings. Many surgeons attach a video camera to the laparoscope to enlarge the view. During a diagnostic laparoscopy, the surgeon may be looking for inflammation, infection, or cancer. Your surgeon may take tissue samples(biopsies). The samples are sent to a specialist in looking at cells and tissue samples (pathologist). The pathologist examines them under a microscope. Biopsies can help to diagnose or confirm a  disease. AFTER THE PROCEDURE   The gas is released from inside the abdomen.  The incisions are closed with stitches (sutures). Because these incisions are small (usually less than 1/2 inch), there is usually minimal discomfort after the procedure. There may be some mild discomfort in the throat. This is from the tube placed in the throat while you were sleeping. You may have some mild abdominal discomfort. There may also be discomfort from the instrument placement incisions in the abdomen.  The recovery time is shortened as long as there are no complications.  You will rest in a recovery room until stable and doing well. As long as there are no complications, you may be allowed to go home. FINDING OUT THE RESULTS OF YOUR TEST Not all test results are available during your visit. If your test results are not back during the visit, make an appointment with your caregiver to find out the results. Do not assume everything is normal if you have not heard from your caregiver or the medical facility. It is important for you to follow up on all of your test results. HOME CARE INSTRUCTIONS   Take all medicines as directed.  Only take over-the-counter or prescription medicines for pain, discomfort, or fever as directed by your caregiver.  Resume daily activities as directed.  Showers are preferred over baths.  You may resume sexual activities in 1 week or as directed.  Do not drive while taking narcotics. SEEK MEDICAL CARE IF:   There is increasing abdominal pain.  There is new pain in the shoulders (shoulder strap areas).  You feel lightheaded or faint.  You have the chills.  You or  increasing abdominal pain.  · There is new pain in the shoulders (shoulder strap areas).  · You feel lightheaded or faint.  · You have the chills.  · You or your child has an oral temperature above 102° F (38.9° C).  · There is pus-like (purulent) drainage from any of the wounds.  · You are unable to pass gas or have a bowel movement.  · You feel sick to your stomach (nauseous) or throw up (vomit).  MAKE SURE YOU:   · Understand these instructions.  · Will watch  your condition.  · Will get help right away if you are not doing well or get worse.  Document Released: 08/20/2000 Document Revised: 09/08/2012 Document Reviewed: 05/14/2007  ExitCare® Patient Information ©2014 [employer].

## 2013-06-02 NOTE — Anesthesia Postprocedure Evaluation (Signed)
Anesthesia Post Note  Patient: Olivia [name]  Procedure(s) Performed: Procedure(s) (LRB): DIAGNOSTIC LAPAROSCOPY (N/A) LAPAROSCOPIC LYSIS OF ADHESIONS (N/A)  Anesthesia type: General  Patient location: PACU  Post pain: Pain level controlled  Post assessment: Patient's Cardiovascular Status Stable  Last Vitals:  Filed Vitals:   06/02/13 1345  BP: 154/67  Pulse: 91  Temp: 36.5 C  Resp: 16    Post vital signs: Reviewed and stable  Level of consciousness: alert  Complications: No apparent anesthesia complications

## 2013-06-03 ENCOUNTER — Encounter (HOSPITAL_COMMUNITY): Payer: Self-pay | Admitting: Surgery

## 2013-06-03 NOTE — Op Note (Signed)
Olivia [name], Olivia [name]               ACCOUNT NO.:  [id number]  MEDICAL RECORD NO.:  [id number]  LOCATION:  [facility]                         FACILITY:  [facility]  PHYSICIAN:  Marcello Moores Olivia [name], M.D.DATE OF BIRTH:  07/22/1940  DATE OF PROCEDURE:  06/02/2013 DATE OF DISCHARGE:  06/02/2013                              OPERATIVE REPORT   PREOPERATIVE DIAGNOSES:  Abdominal pain, nausea, vomiting, and questionable hernia on CT scan.  POSTOPERATIVE DIAGNOSES:  Abdominal pain, nausea, and vomiting.  No evidence of incisional or ventral hernia.  PROCEDURE:  Laparoscopic lysis of adhesions.  SURGEON:  Marcello Moores A. Olivia [name], M.D.  ANESTHESIA:  General endotracheal anesthesia.  SPECIMENS:  None.  IV FLUIDS:  Approximately 4 mL of crystalloid.  ESTIMATED BLOOD LOSS:  Minimal.  INDICATIONS FOR PROCEDURE:  The patient is a 73 year old female with chronic epigastric abdominal pain.  She has had a previous GI workup which was negative and a cholecystectomy in the past.  She had a CT scan, which showed mesh from previous TRAM repair back in 1976.  There was some question if there is fluid around this mesh or she had a recurrent hernia in this area.  Upon examination, I could not palpate a hernia and had a long discussion about options for her.  I explained to her that, given her CT findings and exam, I was not optimistic about finding any surgical pathology.  I did talk about laparoscopy.  There was a possibility to take a better look at her abdominal wall and intra- abdominal visceral since she has had an extensive workup and nothing can be found to explain her abdominal pain and her nausea and vomiting.  I explained to her success for this procedure relieving symptoms to be less than 50% at best.  After discussion of this with her and her family in great length, they wished to at least proceed with laparoscopy with possibility of hernia repair if present or addressing other intra- abdominal pathology  to explain her pain.  Risk of bleeding, infection, organ injury, bowel perforation, death, DVT, exacerbation of underlying medical problems, and the need for other operative procedures discussed at great length.  Success of this procedure explained to her would be less than 50% at relieving her symptoms.  They agreed to proceed despite the above.  DESCRIPTION OF PROCEDURE:  Patient was met in the holding area. Questions were answered.  She was taken back to the operating room and placed supine on the OR table.  General anesthesia was initiated without difficulty.  Foley catheter was placed.  Abdomen was prepped and draped in sterile fashion.  Time-out was done and she received preoperative antibiotics.  Left upper quadrant 5-mm port was placed using the Optiview technique to directly pass the trocar through the abdominal wall under direct vision into the abdominal cavity without injury. Pneumoperitoneum was then created to 15 mmHg of CO2 and laparoscope was placed.  No evidence of injury from these trocar site placements.  I placed two other 5-mm ports, one in the right upper quadrant and second in the left upper quadrant.  I examined her abdominal wall and saw no obvious hernia defect.  I used a Harmonic Scalpel to  take the falciform ligament down to get a better view of the backside of the abdominal wall.  We took this all the way up to the triangular ligament of liver. Upon examination, there was no evidence of hernia.  There was minimal attenuation in the midline, but her abdominal wall appeared intact without any defects or significant attenuation.  I saw no evidence of any infection, fluid, or other signs of inflammatory change to the anterior abdominal wall.  The liver was normal.  Stomach was normal. Gallbladder surgically absent.  Small intestine normal.  Large bowel normal.  No evidence of appendix.  This appeared to be removed with her hysterectomy many years ago.  The  remainder of her abdominal examination showed no evidence of any other hernia.  After taking down of the falciform, it was found to be hemostatic upon inspection.  At this point in time, I saw no other cause of her abdominal pain.  We then removed all of her ports after examining the abdominal cavity for any evidence of injury from port insertion which there were none that I could see. All ports were then removed and passed off the field after allowing the CO2 to escape.  Skin incision was closed with 4-0 Monocryl and Dermabond.  All final counts of sponge, needle, and instruments found to be correct in this portion of the case.  The patient was awoken, extubated, taken to recovery in satisfactory condition.     Danniella Robben A. Lizanne Erker, M.D.     TAC/MEDQ  D:  06/02/2013  T:  06/03/2013  Job:  [id number]

## 2013-06-12 NOTE — Discharge Summary (Signed)
Pt found not to have and incisional hernia only weakening of abdominal wall.  This required only laparoscopy not formal hernia repair.  The patient did not meet requirements for inpatient status.  If this required repair,  She would need inpatient services. The patients final disposition is outpatient.

## 2013-06-22 ENCOUNTER — Encounter (INDEPENDENT_AMBULATORY_CARE_PROVIDER_SITE_OTHER): Payer: Self-pay

## 2013-06-22 ENCOUNTER — Encounter (INDEPENDENT_AMBULATORY_CARE_PROVIDER_SITE_OTHER): Payer: Self-pay | Admitting: Surgery

## 2013-06-22 ENCOUNTER — Ambulatory Visit (INDEPENDENT_AMBULATORY_CARE_PROVIDER_SITE_OTHER): Payer: Medicare Other | Admitting: Surgery

## 2013-06-22 ENCOUNTER — Telehealth (INDEPENDENT_AMBULATORY_CARE_PROVIDER_SITE_OTHER): Payer: Self-pay | Admitting: *Deleted

## 2013-06-22 VITALS — BP 140/90 | HR 84 | Temp 98.1°F | Resp 14 | Ht 64.0 in | Wt 175.6 lb

## 2013-06-22 DIAGNOSIS — R109 Unspecified abdominal pain: Secondary | ICD-10-CM

## 2013-06-22 NOTE — Telephone Encounter (Signed)
I spoke with pt and informed her of the appt with Dr. Michail Sermon at [facility] on 06/29/13 @ 2:15pm.  She is agreeable with this appt.

## 2013-06-22 NOTE — Patient Instructions (Signed)
Will refer to [facility] for opinion.  Please bring pathology report and other reports to this visit.

## 2013-06-22 NOTE — Progress Notes (Signed)
Patient returns after diagnostic laparoscopy for abdominal pain and possible incisional hernia on preoperative CT scan by primary care. Laparoscopy showed no hernia or other intra-abdominal source for her persistent epigastric abdominal pain. She brought a copy of the endoscopy report from May 2014 which showed a nodular abnormality in her stomach that was apparently biopsied by a gastroenterologist in [place]. She was told this was a benign finding. She feels a little better but continues to have the same epigastric discomfort as he had preoperatively.  Exam: Port sites clean dry and intact. Abdomen soft nontender.  Impression: Status post diagnostic laparoscopy for epigastric abdominal pain with otherwise negative workup.  Plan: She does have endoscopic findings of some sort of pathology her stomach. I've asked her to copy me the pathology report. I feel she requires another opinion and more than likely is another upper endoscopy since he continues to have epigastric abdominal pain and findings on upper endoscopy. I will refer her to [facility] for opinion and possible repeat upper endoscopy. 

## 2013-06-25 ENCOUNTER — Encounter (INDEPENDENT_AMBULATORY_CARE_PROVIDER_SITE_OTHER): Payer: Self-pay

## 2013-06-29 ENCOUNTER — Encounter (HOSPITAL_COMMUNITY): Payer: Self-pay | Admitting: *Deleted

## 2013-06-30 ENCOUNTER — Encounter (HOSPITAL_COMMUNITY): Payer: Self-pay | Admitting: Pharmacy Technician

## 2013-07-02 ENCOUNTER — Other Ambulatory Visit (HOSPITAL_COMMUNITY): Payer: Self-pay | Admitting: *Deleted

## 2013-07-02 DIAGNOSIS — R42 Dizziness and giddiness: Secondary | ICD-10-CM

## 2013-07-07 ENCOUNTER — Encounter (INDEPENDENT_AMBULATORY_CARE_PROVIDER_SITE_OTHER): Payer: Medicare Other

## 2013-07-07 DIAGNOSIS — R42 Dizziness and giddiness: Secondary | ICD-10-CM

## 2013-07-07 DIAGNOSIS — I6529 Occlusion and stenosis of unspecified carotid artery: Secondary | ICD-10-CM

## 2013-07-08 ENCOUNTER — Encounter (HOSPITAL_COMMUNITY)
Admission: RE | Disposition: A | Discharge status: Home / Self Care | Payer: Self-pay | Source: Ambulatory Visit | Attending: Gastroenterology

## 2013-07-08 ENCOUNTER — Encounter (HOSPITAL_COMMUNITY): Payer: Self-pay | Admitting: *Deleted

## 2013-07-08 ENCOUNTER — Ambulatory Visit (HOSPITAL_COMMUNITY): Payer: Medicare Other | Admitting: Anesthesiology

## 2013-07-08 ENCOUNTER — Encounter (HOSPITAL_COMMUNITY): Payer: Medicare Other | Admitting: Anesthesiology

## 2013-07-08 ENCOUNTER — Other Ambulatory Visit: Payer: Self-pay | Admitting: Gastroenterology

## 2013-07-08 ENCOUNTER — Ambulatory Visit (HOSPITAL_COMMUNITY)
Admission: RE | Admit: 2013-07-08 | Discharge: 2013-07-08 | Disposition: A | Discharge status: Home / Self Care | Payer: Medicare Other | Source: Ambulatory Visit | Attending: Gastroenterology | Admitting: Gastroenterology

## 2013-07-08 ENCOUNTER — Encounter (HOSPITAL_COMMUNITY): Payer: Self-pay | Admitting: Internal Medicine

## 2013-07-08 DIAGNOSIS — K2970 Gastritis, unspecified, without bleeding: Secondary | ICD-10-CM | POA: Insufficient documentation

## 2013-07-08 DIAGNOSIS — N289 Disorder of kidney and ureter, unspecified: Secondary | ICD-10-CM | POA: Insufficient documentation

## 2013-07-08 DIAGNOSIS — I10 Essential (primary) hypertension: Secondary | ICD-10-CM | POA: Insufficient documentation

## 2013-07-08 DIAGNOSIS — E039 Hypothyroidism, unspecified: Secondary | ICD-10-CM | POA: Insufficient documentation

## 2013-07-08 DIAGNOSIS — J45909 Unspecified asthma, uncomplicated: Secondary | ICD-10-CM | POA: Insufficient documentation

## 2013-07-08 DIAGNOSIS — K2990 Gastroduodenitis, unspecified, without bleeding: Secondary | ICD-10-CM

## 2013-07-08 DIAGNOSIS — K319 Disease of stomach and duodenum, unspecified: Secondary | ICD-10-CM | POA: Insufficient documentation

## 2013-07-08 DIAGNOSIS — R1013 Epigastric pain: Secondary | ICD-10-CM | POA: Insufficient documentation

## 2013-07-08 HISTORY — DX: Nausea with vomiting, unspecified: Z98.890

## 2013-07-08 HISTORY — DX: Other specified postprocedural states: R11.2

## 2013-07-08 HISTORY — PX: EUS: SHX5427

## 2013-07-08 SURGERY — ESOPHAGEAL ENDOSCOPIC ULTRASOUND (EUS) RADIAL
Anesthesia: Monitor Anesthesia Care

## 2013-07-08 MED ORDER — LIDOCAINE HCL (CARDIAC) 20 MG/ML IV SOLN
INTRAVENOUS | Status: DC | PRN
  Administered 2013-07-08: 100 mg via INTRAVENOUS

## 2013-07-08 MED ORDER — PROMETHAZINE HCL 25 MG/ML IJ SOLN: 6.2500 mg | INTRAMUSCULAR | Status: DC | PRN

## 2013-07-08 MED ORDER — ONDANSETRON HCL 4 MG/2ML IJ SOLN
INTRAMUSCULAR | Status: DC | PRN
Start: 2013-07-08 — End: 2013-07-08
  Administered 2013-07-08: 4 mg via INTRAVENOUS

## 2013-07-08 MED ORDER — LIDOCAINE HCL (CARDIAC) 20 MG/ML IV SOLN
INTRAVENOUS | Status: AC
  Filled 2013-07-08: qty 5

## 2013-07-08 MED ORDER — ONDANSETRON HCL 4 MG/2ML IJ SOLN
INTRAMUSCULAR | Status: AC
  Filled 2013-07-08: qty 2

## 2013-07-08 MED ORDER — MIDAZOLAM HCL 5 MG/5ML IJ SOLN
INTRAMUSCULAR | Status: DC | PRN
  Administered 2013-07-08: 2 mg via INTRAVENOUS

## 2013-07-08 MED ORDER — PROPOFOL INFUSION 10 MG/ML OPTIME
INTRAVENOUS | Status: DC | PRN
  Administered 2013-07-08: 140 ug/kg/min via INTRAVENOUS

## 2013-07-08 MED ORDER — PROPOFOL 10 MG/ML IV BOLUS
INTRAVENOUS | Status: AC
  Filled 2013-07-08: qty 20

## 2013-07-08 MED ORDER — KETAMINE HCL 10 MG/ML IJ SOLN
INTRAMUSCULAR | Status: DC | PRN
  Administered 2013-07-08: 20 mg via INTRAVENOUS

## 2013-07-08 MED ORDER — MIDAZOLAM HCL 2 MG/2ML IJ SOLN
INTRAMUSCULAR | Status: AC
  Filled 2013-07-08: qty 2

## 2013-07-08 MED ORDER — BUTAMBEN-TETRACAINE-BENZOCAINE 2-2-14 % EX AERO
INHALATION_SPRAY | CUTANEOUS | Status: DC | PRN
  Administered 2013-07-08: 2 via TOPICAL

## 2013-07-08 MED ORDER — SODIUM CHLORIDE 0.9 % IV SOLN
INTRAVENOUS | Status: DC
  Administered 2013-07-08: 1000 mL via INTRAVENOUS

## 2013-07-08 NOTE — Addendum Note (Signed)
Addended by: Adalaya Irion on: 07/08/2013 09:30 AM   Modules accepted: Orders  

## 2013-07-08 NOTE — Op Note (Signed)
[facility] [place], [id number]   ENDOSCOPIC ULTRASOUND PROCEDURE REPORT  PATIENT: Olivia, [name]  MR#: [id number] BIRTHDATE: [birth year]  GENDER: Female ENDOSCOPIST: Arta Silence, MD REFERRED BY:  Wilford Corner, M.D. PROCEDURE DATE:  07/08/2013 PROCEDURE:   Upper EUS ASA CLASS:      Class III INDICATIONS:   1.  gastric nodule. MEDICATIONS: MAC sedation, administered by CRNA and Cetacaine spray x 2  DESCRIPTION OF PROCEDURE:   After the risks benefits and alternatives of the procedure were  explained, informed consent was obtained. The patient was then placed in the left, lateral, decubitus postion and IV sedation was administered. Throughout the procedure, the patients blood pressure, pulse and oxygen saturations were monitored continuously.  Under direct visualization, the EUS scope  endoscope was introduced through the mouth  and advanced to the second portion of the duodenum .  Water was used as necessary to provide an acoustic interface.  Upon completion of the imaging, water was removed and the patient was sent to the recovery room in satisfactory condition.    FINDINGS:      EGD:  Few diminutive nodules in pre-pyloric antrum, associated with some gastritis.  Otherwise normal to second portion of the duodenum. EUS:  Pre-pyloric gastric nodules are diminutive and appear to be associated with some mild muscularis propria hypertrophy.  No discrete "mass" was identified.  No perigastric adenopathy is identified.  Incidental notation of bile duct was normal without evidence of choledocholithiasis.  IMPRESSION:     As above.  Gastric nodules are benign-appearing, no worrisome features of mass.   Very doubtful these nodules are at root of her abdominal pain symptoms.  RECOMMENDATIONS:     1.  Watch for potential complications of procedure. 2.  No further investigation into her gastric nodules is presently indicated. 3.  Follow-up  with Dr. Michail Sermon for ongoing investigation into her abdominal pain.   _______________________________ Lorrin Mais:  Arta Silence, MD 07/08/2013 12:28 PM   CC:

## 2013-07-08 NOTE — H&P (View-Only) (Signed)
Patient returns after diagnostic laparoscopy for abdominal pain and possible incisional hernia on preoperative CT scan by primary care. Laparoscopy showed no hernia or other intra-abdominal source for her persistent epigastric abdominal pain. She brought a copy of the endoscopy report from May 2014 which showed a nodular abnormality in her stomach that was apparently biopsied by a gastroenterologist in [place]. She was told this was a benign finding. She feels a little better but continues to have the same epigastric discomfort as he had preoperatively.  Exam: Port sites clean dry and intact. Abdomen soft nontender.  Impression: Status post diagnostic laparoscopy for epigastric abdominal pain with otherwise negative workup.  Plan: She does have endoscopic findings of some sort of pathology her stomach. I've asked her to copy me the pathology report. I feel she requires another opinion and more than likely is another upper endoscopy since he continues to have epigastric abdominal pain and findings on upper endoscopy. I will refer her to [facility] for opinion and possible repeat upper endoscopy.

## 2013-07-08 NOTE — Anesthesia Postprocedure Evaluation (Signed)
  Anesthesia Post-op Note  Patient: Olivia [name]  Procedure(s) Performed: Procedure(s) (LRB): ESOPHAGEAL ENDOSCOPIC ULTRASOUND (EUS) RADIAL (N/A)  Patient Location: PACU  Anesthesia Type: MAC  Level of Consciousness: awake and alert   Airway and Oxygen Therapy: Patient Spontanous Breathing  Post-op Pain: mild  Post-op Assessment: Post-op Vital signs reviewed, Patient's Cardiovascular Status Stable, Respiratory Function Stable, Patent Airway and No signs of Nausea or vomiting  Last Vitals:  Filed Vitals:   07/08/13 1245  BP: 142/92  Pulse:   Temp:   Resp: 16    Post-op Vital Signs: stable   Complications: No apparent anesthesia complications

## 2013-07-08 NOTE — Transfer of Care (Signed)
Immediate Anesthesia Transfer of Care Note  Patient: Olivia [name]  Procedure(s) Performed: Procedure(s): ESOPHAGEAL ENDOSCOPIC ULTRASOUND (EUS) RADIAL (N/A)  Patient Location: PACU and Endoscopy Unit  Anesthesia Type:MAC  Level of Consciousness: awake and patient cooperative  Airway & Oxygen Therapy: Patient Spontanous Breathing and Patient connected to face mask oxygen  Post-op Assessment: Report given to PACU RN and Post -op Vital signs reviewed and stable  Post vital signs: Reviewed and stable  Complications: No apparent anesthesia complications

## 2013-07-08 NOTE — Interval H&P Note (Signed)
History and Physical Interval Note:  07/08/2013 11:41 AM  Olivia [name]  has presented today for surgery, with the diagnosis of epigastric abd.pain  The various methods of treatment have been discussed with the patient and family. After consideration of risks, benefits and other options for treatment, the patient has consented to  Procedure(s): ESOPHAGEAL ENDOSCOPIC ULTRASOUND (EUS) RADIAL (N/A) as a surgical intervention .  The patient's history has been reviewed, patient examined, no change in status, stable for surgery.  I have reviewed the patient's chart and labs.  Questions were answered to the patient's satisfaction.     Chet Greenley M  Assessment:  1.  Chronic abdominal pain. 2.  Gastric nodule.  Plan:  1.  Endoscopy (EGD) and endoscopic ultrasound (EUS). 2.  Risks (bleeding, infection, bowel perforation that could require surgery, sedation-related changes in cardiopulmonary systems), benefits (identification and possible treatment of source of symptoms, exclusion of certain causes of symptoms), and alternatives (watchful waiting, radiographic imaging studies, empiric medical treatment) of upper endoscopy and upper endoscopic ultrasound (EGD + EUS) were explained to patient/family in detail and patient wishes to proceed.

## 2013-07-08 NOTE — Preoperative (Signed)
Beta Blockers   Reason not to administer Beta Blockers:Not Applicable 

## 2013-07-08 NOTE — Discharge Instructions (Signed)
Gastrointestinal Endoscopy °Care After °Refer to this sheet in the next few weeks. These instructions provide you with information on caring for yourself after your procedure. Your caregiver may also give you more specific instructions. Your treatment has been planned according to current medical practices, but problems sometimes occur. Call your caregiver if you have any problems or questions after your procedure. °HOME CARE INSTRUCTIONS °· If you were given medicine to help you relax (sedative), do not drive, operate machinery, or sign important documents for 24 hours. °· Avoid alcohol and hot or warm beverages for the first 24 hours after the procedure. °· Only take over-the-counter or prescription medicines for pain, discomfort, or fever as directed by your caregiver. You may resume taking your normal medicines unless your caregiver tells you otherwise. Ask your caregiver when you may resume taking medicines that may cause bleeding, such as aspirin, clopidogrel, or warfarin. °· You may return to your normal diet and activities on the day after your procedure, or as directed by your caregiver. Walking may help to reduce any bloated feeling in your abdomen. °· Drink enough fluids to keep your urine clear or pale yellow. °· You may gargle with salt water if you have a sore throat. °SEEK IMMEDIATE MEDICAL CARE IF: °· You have severe nausea or vomiting. °· You have severe abdominal pain, abdominal cramps that last longer than 6 hours, or abdominal swelling (distention). °· You have severe shoulder or back pain. °· You have trouble swallowing. °· You have shortness of breath, your breathing is shallow, or you are breathing faster than normal. °· You have a fever or a rapid heartbeat. °· You vomit blood or material that looks like coffee grounds. °· You have bloody, black, or tarry stools. °MAKE SURE YOU: °· Understand these instructions. °· Will watch your condition. °· Will get help right away if you are not doing  well or get worse. °Document Released: 12/27/2003 Document Revised: 11/13/2011 Document Reviewed: 08/14/2011 °ExitCare® Patient Information ©2014 [employer]. ° °

## 2013-07-08 NOTE — Anesthesia Preprocedure Evaluation (Signed)
Anesthesia Evaluation  Patient identified by MRN, date of birth, ID band Patient awake    Reviewed: Allergy & Precautions, H&P , NPO status , Patient's Chart, lab work & pertinent test results  Airway Mallampati: II TM Distance: >3 FB Neck ROM: Limited    Dental no notable dental hx.    Pulmonary asthma ,  breath sounds clear to auscultation  Pulmonary exam normal       Cardiovascular hypertension, Pt. on medications + dysrhythmias Atrial Fibrillation Rhythm:Regular Rate:Normal     Neuro/Psych .MG  Neuromuscular disease negative psych ROS   GI/Hepatic negative GI ROS, Neg liver ROS,   Endo/Other  Hypothyroidism   Renal/GU Renal InsufficiencyRenal disease  negative genitourinary   Musculoskeletal  (+) Arthritis -, Rheumatoid disorders,    Abdominal   Peds negative pediatric ROS (+)  Hematology negative hematology ROS (+)   Anesthesia Other Findings   Reproductive/Obstetrics negative OB ROS                           Anesthesia Physical Anesthesia Plan  ASA: III  Anesthesia Plan: MAC   Post-op Pain Management:    Induction: Intravenous  Airway Management Planned: Nasal Cannula  Additional Equipment:   Intra-op Plan:   Post-operative Plan:   Informed Consent: I have reviewed the patients History and Physical, chart, labs and discussed the procedure including the risks, benefits and alternatives for the proposed anesthesia with the patient or authorized representative who has indicated his/her understanding and acceptance.   Dental advisory given  Plan Discussed with: CRNA and Surgeon  Anesthesia Plan Comments:         Anesthesia Quick Evaluation

## 2013-07-09 ENCOUNTER — Encounter (HOSPITAL_COMMUNITY): Payer: Self-pay | Admitting: Gastroenterology

## 2013-07-22 ENCOUNTER — Ambulatory Visit: Payer: Self-pay | Admitting: Neurology

## 2013-07-31 ENCOUNTER — Other Ambulatory Visit: Payer: Self-pay | Admitting: *Deleted

## 2013-07-31 MED ORDER — LEVOTHYROXINE SODIUM 50 MCG PO TABS: 50.0000 ug | ORAL_TABLET | Freq: Every day | ORAL | Status: DC

## 2013-11-04 ENCOUNTER — Other Ambulatory Visit: Payer: Self-pay | Admitting: *Deleted

## 2013-11-30 ENCOUNTER — Other Ambulatory Visit: Payer: Self-pay | Admitting: *Deleted

## 2013-11-30 NOTE — Telephone Encounter (Signed)
Received faxed refill request from pharmacy. Last TSH 12/14/11. Last refill 11/04/13 note sent to pharmacy that patient needed office visit for further refills. No appointment scheduled. Is it okay to refill medication?

## 2013-12-01 NOTE — Telephone Encounter (Signed)
Electronic refill request.  Last office visit:   08/19/12.  Last Filled:   30 tablet 2 RF on   07/31/2013.  Please advise.

## 2013-12-01 NOTE — Telephone Encounter (Signed)
Schedule f/u then send in 90 day supply.  Thanks.

## 2013-12-04 ENCOUNTER — Other Ambulatory Visit: Payer: Self-pay | Admitting: *Deleted

## 2013-12-09 NOTE — Telephone Encounter (Signed)
Please close encounter if completed

## 2013-12-15 NOTE — Telephone Encounter (Signed)
Please close encounter if refill has been done.

## 2013-12-21 ENCOUNTER — Ambulatory Visit: Payer: Self-pay | Admitting: Anesthesiology

## 2013-12-21 NOTE — Telephone Encounter (Signed)
See refill request. Has patient changed providers?

## 2013-12-23 ENCOUNTER — Ambulatory Visit: Payer: Self-pay | Admitting: Podiatry

## 2014-03-25 ENCOUNTER — Other Ambulatory Visit: Payer: Self-pay | Admitting: *Deleted

## 2014-06-30 ENCOUNTER — Encounter: Payer: Self-pay | Admitting: Neurology

## 2014-07-05 ENCOUNTER — Encounter: Payer: Self-pay | Admitting: Neurology

## 2014-08-03 ENCOUNTER — Encounter
Admit: 2014-08-03 | Disposition: A | Discharge status: Home / Self Care | Payer: Self-pay | Attending: Neurology | Admitting: Neurology

## 2014-08-27 ENCOUNTER — Encounter
Admit: 2014-08-27 | Disposition: A | Discharge status: Home / Self Care | Payer: Self-pay | Attending: Neurology | Admitting: Neurology

## 2014-09-14 NOTE — Op Note (Signed)
PATIENT NAME:  Olivia [name], Olivia [name] MR#:  [id number] DATE OF BIRTH:  1941/05/21  DATE OF PROCEDURE:  12/17/2011  PREOPERATIVE DIAGNOSIS:  Cataract, left eye.    POSTOPERATIVE DIAGNOSIS:  Cataract, left eye.  PROCEDURE PERFORMED:  Extracapsular cataract extraction using phacoemulsification with placement of an Alcon [id number], 21.0-diopter posterior chamber lens, serial # [id number].  SURGEON:  Loura Back. Marvelle Caudill, MD  ASSISTANT:  None.  ANESTHESIA:  4% lidocaine and 0.75% Marcaine in a 50/50 mixture with 10 units per mL of Hylenex added, given as peribulbar.  ANESTHESIOLOGIST:  Dr. Benjamine Mola   COMPLICATIONS:  None.  ESTIMATED BLOOD LOSS:  Less than 1 mL.  DESCRIPTION OF PROCEDURE:  The patient was brought to the operating room and given a peribulbar block.  The patient was then prepped and draped in the usual fashion.  The vertical rectus muscles were imbricated using 5-0 silk sutures.  These sutures were then clamped to the sterile drapes as bridle sutures.  A limbal peritomy was performed extending two clock hours and hemostasis was obtained with cautery.  A partial thickness scleral groove was made at the surgical limbus and dissected anteriorly in a lamellar dissection using an Alcon crescent knife.  The anterior chamber was entered supero-temporally with a Superblade and through the lamellar dissection with a 2.6 mm keratome.  DisCoVisc was used to replace the aqueous and a continuous tear capsulorrhexis was carried out.  Hydrodissection and hydrodelineation were carried out with balanced salt and a 27 gauge canula.  The nucleus was rotated to confirm the effectiveness of the hydrodissection.  Phacoemulsification was carried out using a divide-and-conquer technique.  Total ultrasound time was 1 minute and 7.8 seconds with an average power of 15.1 percent, CDE 17.99.  Irrigation/aspiration was used to remove the residual cortex.  DisCoVisc was used to inflate the capsule and the internal  incision was enlarged to 3 mm with the crescent knife.  The intraocular lens was folded and inserted into the capsular bag using the AcrySert delivery system.  Irrigation/aspiration was used to remove the residual DisCoVisc.  Miostat was injected into the anterior chamber through the paracentesis track to inflate the anterior chamber and induce miosis.  The wound was checked for leaks and none were found. The conjunctiva was closed with cautery and the bridle sutures were removed.  Two drops of 0.3% Vigamox were placed on the eye.   An eye shield was placed on the eye.  The patient was discharged to the recovery room in good condition.  ____________________________ Loura Back Tyshauna Finkbiner, MD sad:drc D: 12/17/2011 13:19:48 ET T: 12/17/2011 13:39:33 ET JOB#: [id number]  cc: Remo Lipps A. Evadene Wardrip, MD, <Dictator> Martie Lee MD ELECTRONICALLY SIGNED 12/31/2011 13:40

## 2014-10-18 ENCOUNTER — Other Ambulatory Visit: Payer: Self-pay | Admitting: Otolaryngology

## 2014-10-18 DIAGNOSIS — R42 Dizziness and giddiness: Secondary | ICD-10-CM

## 2014-10-27 ENCOUNTER — Ambulatory Visit
Admission: RE | Admit: 2014-10-27 | Discharge: 2014-10-27 | Disposition: A | Discharge status: Home / Self Care | Payer: Medicare Other | Source: Ambulatory Visit | Attending: Otolaryngology | Admitting: Otolaryngology

## 2014-10-27 DIAGNOSIS — R42 Dizziness and giddiness: Secondary | ICD-10-CM | POA: Diagnosis not present

## 2014-10-27 MED ORDER — GADOBENATE DIMEGLUMINE 529 MG/ML IV SOLN
16.0000 mL | Freq: Once | INTRAVENOUS | Status: AC
  Administered 2014-10-27: 16 mL via INTRAVENOUS

## 2014-11-01 ENCOUNTER — Telehealth: Payer: Self-pay | Admitting: *Deleted

## 2014-11-01 NOTE — Telephone Encounter (Signed)
Lmom to call our office. Time to schedule carotid u/s (1 yr f/u).

## 2014-12-28 ENCOUNTER — Encounter: Payer: Self-pay | Admitting: Gastroenterology

## 2015-03-28 ENCOUNTER — Other Ambulatory Visit: Payer: Self-pay | Admitting: Internal Medicine

## 2015-03-28 DIAGNOSIS — I6523 Occlusion and stenosis of bilateral carotid arteries: Secondary | ICD-10-CM

## 2015-04-04 ENCOUNTER — Ambulatory Visit (INDEPENDENT_AMBULATORY_CARE_PROVIDER_SITE_OTHER): Payer: Medicare Other

## 2015-04-04 DIAGNOSIS — I6523 Occlusion and stenosis of bilateral carotid arteries: Secondary | ICD-10-CM

## 2015-06-08 ENCOUNTER — Ambulatory Visit (INDEPENDENT_AMBULATORY_CARE_PROVIDER_SITE_OTHER): Payer: Medicare Other | Admitting: Obstetrics and Gynecology

## 2015-06-08 ENCOUNTER — Encounter: Payer: Self-pay | Admitting: Obstetrics and Gynecology

## 2015-06-08 VITALS — BP 119/73 | HR 69 | Ht 63.0 in | Wt 174.0 lb

## 2015-06-08 DIAGNOSIS — Z8541 Personal history of malignant neoplasm of cervix uteri: Secondary | ICD-10-CM

## 2015-06-08 DIAGNOSIS — N393 Stress incontinence (female) (male): Secondary | ICD-10-CM | POA: Diagnosis not present

## 2015-06-08 DIAGNOSIS — N3289 Other specified disorders of bladder: Secondary | ICD-10-CM | POA: Diagnosis not present

## 2015-06-08 DIAGNOSIS — N952 Postmenopausal atrophic vaginitis: Secondary | ICD-10-CM

## 2015-06-08 DIAGNOSIS — C539 Malignant neoplasm of cervix uteri, unspecified: Secondary | ICD-10-CM

## 2015-06-08 DIAGNOSIS — Z853 Personal history of malignant neoplasm of breast: Secondary | ICD-10-CM

## 2015-06-08 MED ORDER — OXYBUTYNIN CHLORIDE ER 5 MG PO TB24: 5.0000 mg | ORAL_TABLET | Freq: Every day | ORAL | Status: DC

## 2015-06-08 NOTE — Patient Instructions (Signed)
1.  Begin oxybutynin every night for unstable bladder. 2.  Referral to urology for sling due to stress urinary incontinence. 3.  Return in 6 weeks for follow-up on unstable bladder

## 2015-06-12 DIAGNOSIS — N3289 Other specified disorders of bladder: Secondary | ICD-10-CM | POA: Insufficient documentation

## 2015-06-12 DIAGNOSIS — N952 Postmenopausal atrophic vaginitis: Secondary | ICD-10-CM | POA: Insufficient documentation

## 2015-06-12 DIAGNOSIS — C539 Malignant neoplasm of cervix uteri, unspecified: Secondary | ICD-10-CM | POA: Insufficient documentation

## 2015-06-12 DIAGNOSIS — N393 Stress incontinence (female) (male): Secondary | ICD-10-CM | POA: Insufficient documentation
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
## 2015-06-14 DIAGNOSIS — H4940 Progressive external ophthalmoplegia, unspecified eye: Secondary | ICD-10-CM | POA: Insufficient documentation

## 2015-06-14 DIAGNOSIS — H5005 Alternating esotropia: Secondary | ICD-10-CM | POA: Insufficient documentation

## 2015-06-14 HISTORY — DX: Progressive external ophthalmoplegia, unspecified eye: H49.40

## 2015-06-20 ENCOUNTER — Encounter: Payer: Self-pay | Admitting: Urology

## 2015-06-20 ENCOUNTER — Ambulatory Visit (INDEPENDENT_AMBULATORY_CARE_PROVIDER_SITE_OTHER): Payer: Medicare Other | Admitting: Urology

## 2015-06-20 VITALS — BP 152/75 | HR 66 | Ht 64.0 in | Wt 175.1 lb

## 2015-06-20 DIAGNOSIS — N393 Stress incontinence (female) (male): Secondary | ICD-10-CM

## 2015-06-20 DIAGNOSIS — N3941 Urge incontinence: Secondary | ICD-10-CM | POA: Diagnosis not present

## 2015-06-20 DIAGNOSIS — N952 Postmenopausal atrophic vaginitis: Secondary | ICD-10-CM | POA: Diagnosis not present

## 2015-06-20 DIAGNOSIS — R310 Gross hematuria: Secondary | ICD-10-CM

## 2015-06-20 LAB — URINALYSIS, COMPLETE
BILIRUBIN UA: NEGATIVE
Glucose, UA: NEGATIVE
Ketones, UA: NEGATIVE
LEUKOCYTES UA: NEGATIVE
NITRITE UA: NEGATIVE
Protein, UA: NEGATIVE
RBC UA: NEGATIVE
SPEC GRAV UA: 1.02 (ref 1.005–1.030)
Urobilinogen, Ur: 0.2 mg/dL (ref 0.2–1.0)
pH, UA: 5 (ref 5.0–7.5)

## 2015-06-20 LAB — MICROSCOPIC EXAMINATION: RBC MICROSCOPIC, UA: NONE SEEN /HPF (ref 0–?)

## 2015-06-20 LAB — BLADDER SCAN AMB NON-IMAGING: SCAN RESULT: 20
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
## 2015-06-21 LAB — BUN+CREAT
BUN / CREAT RATIO: 30 — AB (ref 11–26)
BUN: 23 mg/dL (ref 8–27)
Creatinine, Ser: 0.77 mg/dL (ref 0.57–1.00)
GFR calc non Af Amer: 76 mL/min/{1.73_m2} (ref >59)
GFR, EST AFRICAN AMERICAN: 88 mL/min/{1.73_m2} (ref >59)

## 2015-06-23 DIAGNOSIS — R310 Gross hematuria: Secondary | ICD-10-CM | POA: Insufficient documentation

## 2015-06-23 DIAGNOSIS — N393 Stress incontinence (female) (male): Secondary | ICD-10-CM | POA: Insufficient documentation

## 2015-06-23 DIAGNOSIS — N3941 Urge incontinence: Secondary | ICD-10-CM | POA: Insufficient documentation

## 2015-06-23 HISTORY — DX: Gross hematuria: R31.0

## 2015-06-28 ENCOUNTER — Ambulatory Visit
Admission: RE | Admit: 2015-06-28 | Discharge: 2015-06-28 | Disposition: A | Discharge status: Home / Self Care | Payer: Medicare Other | Source: Ambulatory Visit | Attending: Urology | Admitting: Urology

## 2015-06-28 DIAGNOSIS — D1771 Benign lipomatous neoplasm of kidney: Secondary | ICD-10-CM | POA: Insufficient documentation

## 2015-06-28 DIAGNOSIS — Z90710 Acquired absence of both cervix and uterus: Secondary | ICD-10-CM | POA: Insufficient documentation

## 2015-06-28 DIAGNOSIS — R310 Gross hematuria: Secondary | ICD-10-CM | POA: Insufficient documentation

## 2015-06-28 HISTORY — DX: Malignant neoplasm of cervix uteri, unspecified: C53.9

## 2015-06-28 MED ORDER — IOHEXOL 300 MG/ML  SOLN
125.0000 mL | Freq: Once | INTRAMUSCULAR | Status: AC | PRN
  Administered 2015-06-28: 125 mL via INTRAVENOUS

## 2015-07-08 ENCOUNTER — Encounter: Payer: Self-pay | Admitting: Urology

## 2015-07-08 ENCOUNTER — Ambulatory Visit (INDEPENDENT_AMBULATORY_CARE_PROVIDER_SITE_OTHER): Payer: Medicare Other | Admitting: Urology

## 2015-07-08 VITALS — BP 126/70 | HR 65 | Ht 64.0 in | Wt 175.4 lb

## 2015-07-08 DIAGNOSIS — R310 Gross hematuria: Secondary | ICD-10-CM | POA: Diagnosis not present

## 2015-07-08 DIAGNOSIS — N393 Stress incontinence (female) (male): Secondary | ICD-10-CM

## 2015-07-08 MED ORDER — CIPROFLOXACIN HCL 500 MG PO TABS
500.0000 mg | ORAL_TABLET | Freq: Once | ORAL | Status: AC
  Administered 2015-07-08: 500 mg via ORAL

## 2015-07-08 MED ORDER — LIDOCAINE HCL 2 % EX GEL
1.0000 "application " | Freq: Once | CUTANEOUS | Status: AC
  Administered 2015-07-08: 1 via URETHRAL

## 2015-07-08 MED ORDER — TRIMETHOPRIM 100 MG PO TABS: 100.0000 mg | ORAL_TABLET | Freq: Every day | ORAL | Status: DC
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
## 2015-07-20 ENCOUNTER — Ambulatory Visit (INDEPENDENT_AMBULATORY_CARE_PROVIDER_SITE_OTHER): Payer: Medicare Other | Admitting: Obstetrics and Gynecology

## 2015-07-20 ENCOUNTER — Encounter: Payer: Self-pay | Admitting: Obstetrics and Gynecology

## 2015-07-20 VITALS — BP 115/64 | HR 77 | Ht 63.0 in | Wt 174.4 lb

## 2015-07-20 DIAGNOSIS — N390 Urinary tract infection, site not specified: Secondary | ICD-10-CM

## 2015-07-20 DIAGNOSIS — N3289 Other specified disorders of bladder: Secondary | ICD-10-CM | POA: Diagnosis not present

## 2015-07-20 DIAGNOSIS — N393 Stress incontinence (female) (male): Secondary | ICD-10-CM

## 2015-07-20 MED ORDER — OXYBUTYNIN CHLORIDE ER 5 MG PO TB24
5.0000 mg | ORAL_TABLET | Freq: Every day | ORAL | Status: DC
Start: 2015-07-20 — End: 2016-03-20

## 2015-07-20 NOTE — Progress Notes (Signed)
Chief complaint: 1. Unstable bladder 2. Stress incontinence  Patient presents for follow-up after beginning oxybutynin 5 mg daily at bedtime. She has noted marked reduction in her urgency symptoms and needs to void at night. She is still going frequently dry today. Episodes of incontinence have diminished. She did complete a course of prophylactic antibiotics started by urology and has not had a UTI in the past 6 weeks. She is due for follow-up with urology in the near future.  Patient feels like she may have development of a bladder infection today. Urine culture will be sent to rule out UTI.  Past medical history, past surgical history, problem list, medications, and allergies are reviewed.  Review of systems: Nocturia once or twice No incontinence at night Daytime urinary frequency continues No significant mouth dryness or eye dryness No constipation  OBJECTIVE: BP 115/64 mmHg  Pulse 77  Ht 5\' 3"  (1.6 m)  Wt 174 lb 6.4 oz (79.107 kg)  BMI 30.90 kg/m2 Exam deferred  ASSESSMENT: 1. Unstable bladder with improvement on oxybutynin 5 mg nightly with decreased episodes of incontinence and nocturia 2. History of recurrent UTI with no infection in the past 6 weeks; status post antibiotic prophylaxis started by urology  PLAN: 1. Continue with oxybutynin 5 mg daily at bedtime 2. Follow up with urology as scheduled 3. Return in 3 months for follow-up  Brayton Mars, MD  A total of 15 minutes were spent face-to-face with the patient during this encounter and over half of that time dealt with counseling and coordination of care.  Note: This dictation was prepared with Dragon dictation along with smaller phrase technology. Any transcriptional errors that result from this process are unintentional.

## 2015-07-20 NOTE — Patient Instructions (Signed)
1. Continue oxybutynin XL 5 mg daily at bedtime 2. Follow up with urology as scheduled 3. Return in 3 months for follow-up

## 2015-07-21 LAB — POCT URINALYSIS DIPSTICK
BILIRUBIN UA: NEGATIVE
Glucose, UA: NEGATIVE
Ketones, UA: NEGATIVE
LEUKOCYTES UA: NEGATIVE
NITRITE UA: NEGATIVE
PH UA: 6
Protein, UA: NEGATIVE
RBC UA: NEGATIVE
Spec Grav, UA: 1.015
UROBILINOGEN UA: 0.2

## 2015-07-21 NOTE — Addendum Note (Signed)
Addended by: Elouise Munroe on: 07/21/2015 02:23 PM   Modules accepted: Orders

## 2015-07-22 LAB — URINE CULTURE

## 2015-08-22 ENCOUNTER — Ambulatory Visit (INDEPENDENT_AMBULATORY_CARE_PROVIDER_SITE_OTHER): Payer: Medicare Other | Admitting: Urology

## 2015-08-22 ENCOUNTER — Encounter: Payer: Self-pay | Admitting: Urology

## 2015-08-22 VITALS — BP 119/73 | HR 73 | Ht 63.0 in | Wt 175.7 lb

## 2015-08-22 DIAGNOSIS — R310 Gross hematuria: Secondary | ICD-10-CM | POA: Diagnosis not present

## 2015-08-22 DIAGNOSIS — N393 Stress incontinence (female) (male): Secondary | ICD-10-CM | POA: Diagnosis not present

## 2015-08-22 LAB — URINALYSIS, COMPLETE
Bilirubin, UA: NEGATIVE
Glucose, UA: NEGATIVE
Ketones, UA: NEGATIVE
LEUKOCYTES UA: NEGATIVE
Nitrite, UA: NEGATIVE
PH UA: 5 (ref 5.0–7.5)
PROTEIN UA: NEGATIVE
RBC, UA: NEGATIVE
Specific Gravity, UA: 1.02 (ref 1.005–1.030)
UUROB: 0.2 mg/dL (ref 0.2–1.0)

## 2015-08-22 LAB — MICROSCOPIC EXAMINATION
BACTERIA UA: NONE SEEN
EPITHELIAL CELLS (NON RENAL): NONE SEEN /HPF (ref 0–10)
RBC, UA: NONE SEEN /hpf (ref 0–?)
WBC UA: NONE SEEN /HPF (ref 0–?)
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
## 2015-09-19 ENCOUNTER — Encounter: Payer: Self-pay | Admitting: Urology

## 2015-09-19 ENCOUNTER — Ambulatory Visit (INDEPENDENT_AMBULATORY_CARE_PROVIDER_SITE_OTHER): Payer: Medicare Other | Admitting: Urology

## 2015-09-19 VITALS — BP 113/65 | HR 65 | Ht 64.0 in | Wt 177.3 lb

## 2015-09-19 DIAGNOSIS — N393 Stress incontinence (female) (male): Secondary | ICD-10-CM | POA: Diagnosis not present

## 2015-09-19 DIAGNOSIS — N3941 Urge incontinence: Secondary | ICD-10-CM | POA: Diagnosis not present

## 2015-09-19 MED ORDER — MIRABEGRON ER 50 MG PO TB24: 50.0000 mg | ORAL_TABLET | Freq: Every day | ORAL | Status: DC

## 2015-09-19 MED ORDER — FESOTERODINE FUMARATE ER 4 MG PO TB24: 4.0000 mg | ORAL_TABLET | Freq: Every day | ORAL | Status: DC
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
## 2015-09-20 LAB — MICROSCOPIC EXAMINATION: BACTERIA UA: NONE SEEN

## 2015-09-20 LAB — URINALYSIS, COMPLETE
BILIRUBIN UA: NEGATIVE
Glucose, UA: NEGATIVE
KETONES UA: NEGATIVE
Leukocytes, UA: NEGATIVE
NITRITE UA: NEGATIVE
PH UA: 5 (ref 5.0–7.5)
Protein, UA: NEGATIVE
Specific Gravity, UA: 1.01 (ref 1.005–1.030)
UUROB: 0.2 mg/dL (ref 0.2–1.0)

## 2015-11-14 ENCOUNTER — Encounter: Payer: Self-pay | Admitting: Urology

## 2015-11-14 ENCOUNTER — Ambulatory Visit (INDEPENDENT_AMBULATORY_CARE_PROVIDER_SITE_OTHER): Payer: Medicare Other | Admitting: Urology

## 2015-11-14 VITALS — BP 125/69 | HR 76 | Ht 62.0 in | Wt 175.0 lb

## 2015-11-14 DIAGNOSIS — N3946 Mixed incontinence: Secondary | ICD-10-CM
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
## 2015-11-14 NOTE — Progress Notes (Signed)
In and Out Catheterization  Patient is present today for a I & O catheterization due to patient unable to provide a urine sample for collection. Patient was cleaned and prepped in a sterile fashion with betadine and Lidocaine 2% jelly was instilled into the urethra.  A 14FR cath was inserted no complications were noted , 163ml of urine return was noted, urine was yellow in color. A clean urine sample was collected for urine culture. Bladder was drained  And catheter was removed with out difficulty.    Preformed by: Fonnie Jarvis, CMA

## 2015-11-17 LAB — CULTURE, URINE COMPREHENSIVE

## 2015-12-20 ENCOUNTER — Inpatient Hospital Stay
Admission: RE | Admit: 2015-12-20 | Discharge: 2015-12-20 | Disposition: A | Discharge status: Home / Self Care | Payer: Self-pay | Source: Ambulatory Visit | Attending: *Deleted | Admitting: *Deleted

## 2015-12-20 ENCOUNTER — Other Ambulatory Visit: Payer: Self-pay | Admitting: *Deleted

## 2015-12-20 DIAGNOSIS — Z9289 Personal history of other medical treatment: Secondary | ICD-10-CM

## 2015-12-21 ENCOUNTER — Other Ambulatory Visit: Payer: Self-pay | Admitting: Nurse Practitioner

## 2015-12-21 DIAGNOSIS — Z1231 Encounter for screening mammogram for malignant neoplasm of breast: Secondary | ICD-10-CM

## 2015-12-26 ENCOUNTER — Other Ambulatory Visit: Payer: Self-pay | Admitting: Nurse Practitioner

## 2015-12-26 DIAGNOSIS — R1319 Other dysphagia: Secondary | ICD-10-CM

## 2015-12-28 ENCOUNTER — Encounter: Payer: Self-pay | Admitting: Urology

## 2015-12-28 ENCOUNTER — Ambulatory Visit (INDEPENDENT_AMBULATORY_CARE_PROVIDER_SITE_OTHER): Payer: Medicare Other | Admitting: Urology

## 2015-12-28 VITALS — BP 115/72 | HR 71 | Ht 64.0 in | Wt 170.3 lb

## 2015-12-28 DIAGNOSIS — N3946 Mixed incontinence: Secondary | ICD-10-CM | POA: Diagnosis not present
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
## 2016-01-02 ENCOUNTER — Ambulatory Visit
Admission: RE | Admit: 2016-01-02 | Discharge: 2016-01-02 | Disposition: A | Discharge status: Home / Self Care | Payer: Medicare Other | Source: Ambulatory Visit | Attending: Nurse Practitioner | Admitting: Nurse Practitioner

## 2016-01-02 DIAGNOSIS — K228 Other specified diseases of esophagus: Secondary | ICD-10-CM | POA: Diagnosis not present

## 2016-01-02 DIAGNOSIS — R1319 Other dysphagia: Secondary | ICD-10-CM | POA: Diagnosis present

## 2016-01-04 ENCOUNTER — Other Ambulatory Visit: Payer: Self-pay | Admitting: Nurse Practitioner

## 2016-01-04 ENCOUNTER — Telehealth: Payer: Self-pay | Admitting: Urology

## 2016-01-04 ENCOUNTER — Ambulatory Visit
Admission: RE | Admit: 2016-01-04 | Discharge: 2016-01-04 | Disposition: A | Discharge status: Home / Self Care | Payer: Medicare Other | Source: Ambulatory Visit | Attending: Nurse Practitioner | Admitting: Nurse Practitioner

## 2016-01-04 DIAGNOSIS — Z1231 Encounter for screening mammogram for malignant neoplasm of breast: Secondary | ICD-10-CM

## 2016-01-04 NOTE — Telephone Encounter (Signed)
Copies of patient's office notes from [place] were sent to [facility] at the patient's request.  Sent via [place] Mail on 01/04/2016.

## 2016-03-19 ENCOUNTER — Telehealth: Payer: Self-pay

## 2016-03-19 NOTE — Telephone Encounter (Signed)
Patient calling office to request a clearance letter for upcoming eye surgery.  Patient stated she saw Dr. Fletcher Anon across the street at the old office.  Patient las ov was 12/31/11 with gollan.  Patient aware that we can see her as a new patient or send records to whichever provider she would like.  Patient offered first available appointment but she declined and said [facility] could see her sooner.  Patient will call back if anything further is needed.

## 2016-03-20 ENCOUNTER — Encounter (INDEPENDENT_AMBULATORY_CARE_PROVIDER_SITE_OTHER): Payer: Self-pay

## 2016-03-20 ENCOUNTER — Encounter: Payer: Self-pay | Admitting: Internal Medicine

## 2016-03-20 ENCOUNTER — Ambulatory Visit (INDEPENDENT_AMBULATORY_CARE_PROVIDER_SITE_OTHER): Payer: Medicare Other | Admitting: Internal Medicine

## 2016-03-20 VITALS — BP 108/62 | HR 72 | Ht 64.0 in | Wt 164.5 lb

## 2016-03-20 DIAGNOSIS — R0789 Other chest pain: Secondary | ICD-10-CM

## 2016-03-20 DIAGNOSIS — Z01810 Encounter for preprocedural cardiovascular examination: Secondary | ICD-10-CM

## 2016-03-20 DIAGNOSIS — I10 Essential (primary) hypertension: Secondary | ICD-10-CM | POA: Diagnosis not present

## 2016-03-20 DIAGNOSIS — I480 Paroxysmal atrial fibrillation: Secondary | ICD-10-CM

## 2016-03-20 DIAGNOSIS — I701 Atherosclerosis of renal artery: Secondary | ICD-10-CM

## 2016-03-20 NOTE — Patient Instructions (Signed)
Medication Instructions:  Your physician recommends that you continue on your current medications as directed. Please refer to the Current Medication list given to you today.   Labwork: none  Testing/Procedures: none  Follow-Up: Your physician wants you to follow-up in: one year with Dr. Saunders Revel.  You will receive a reminder letter in the mail two months in advance. If you don't receive a letter, please call our office to schedule the follow-up appointment.   Any Other Special Instructions Will Be Listed Below (If Applicable).     If you need a refill on your cardiac medications before your next appointment, please call your pharmacy.
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
## 2016-03-21 ENCOUNTER — Telehealth: Payer: Self-pay | Admitting: Internal Medicine

## 2016-03-21 NOTE — Telephone Encounter (Signed)
Per Dr. Saunders Revel, have routed [place] notes and EKG to [facility], Attn: Jeronimo Norma, Surgery Coordinator, [phone]

## 2016-07-02 ENCOUNTER — Encounter: Payer: Self-pay | Admitting: *Deleted

## 2016-07-03 ENCOUNTER — Encounter
Admission: RE | Disposition: A | Discharge status: Home / Self Care | Payer: Self-pay | Source: Ambulatory Visit | Attending: Unknown Physician Specialty

## 2016-07-03 ENCOUNTER — Ambulatory Visit: Payer: Medicare Other | Admitting: Anesthesiology

## 2016-07-03 ENCOUNTER — Ambulatory Visit
Admission: RE | Admit: 2016-07-03 | Discharge: 2016-07-03 | Disposition: A | Discharge status: Home / Self Care | Payer: Medicare Other | Source: Ambulatory Visit | Attending: Unknown Physician Specialty | Admitting: Unknown Physician Specialty

## 2016-07-03 ENCOUNTER — Encounter: Payer: Self-pay | Admitting: *Deleted

## 2016-07-03 DIAGNOSIS — F329 Major depressive disorder, single episode, unspecified: Secondary | ICD-10-CM | POA: Diagnosis not present

## 2016-07-03 DIAGNOSIS — Z8719 Personal history of other diseases of the digestive system: Secondary | ICD-10-CM | POA: Diagnosis not present

## 2016-07-03 DIAGNOSIS — Z8371 Family history of colonic polyps: Secondary | ICD-10-CM | POA: Insufficient documentation

## 2016-07-03 DIAGNOSIS — Z8541 Personal history of malignant neoplasm of cervix uteri: Secondary | ICD-10-CM | POA: Insufficient documentation

## 2016-07-03 DIAGNOSIS — E039 Hypothyroidism, unspecified: Secondary | ICD-10-CM | POA: Diagnosis not present

## 2016-07-03 DIAGNOSIS — Z7982 Long term (current) use of aspirin: Secondary | ICD-10-CM | POA: Diagnosis not present

## 2016-07-03 DIAGNOSIS — K2950 Unspecified chronic gastritis without bleeding: Secondary | ICD-10-CM | POA: Diagnosis not present

## 2016-07-03 DIAGNOSIS — Z87891 Personal history of nicotine dependence: Secondary | ICD-10-CM | POA: Diagnosis not present

## 2016-07-03 DIAGNOSIS — K259 Gastric ulcer, unspecified as acute or chronic, without hemorrhage or perforation: Secondary | ICD-10-CM | POA: Diagnosis not present

## 2016-07-03 DIAGNOSIS — K224 Dyskinesia of esophagus: Secondary | ICD-10-CM | POA: Insufficient documentation

## 2016-07-03 DIAGNOSIS — I739 Peripheral vascular disease, unspecified: Secondary | ICD-10-CM | POA: Insufficient documentation

## 2016-07-03 DIAGNOSIS — Z853 Personal history of malignant neoplasm of breast: Secondary | ICD-10-CM | POA: Diagnosis not present

## 2016-07-03 DIAGNOSIS — G7000 Myasthenia gravis without (acute) exacerbation: Secondary | ICD-10-CM | POA: Diagnosis not present

## 2016-07-03 DIAGNOSIS — N189 Chronic kidney disease, unspecified: Secondary | ICD-10-CM | POA: Diagnosis not present

## 2016-07-03 DIAGNOSIS — R194 Change in bowel habit: Secondary | ICD-10-CM | POA: Diagnosis not present

## 2016-07-03 DIAGNOSIS — R1310 Dysphagia, unspecified: Secondary | ICD-10-CM | POA: Diagnosis not present

## 2016-07-03 DIAGNOSIS — R1013 Epigastric pain: Secondary | ICD-10-CM | POA: Diagnosis present

## 2016-07-03 DIAGNOSIS — E785 Hyperlipidemia, unspecified: Secondary | ICD-10-CM | POA: Diagnosis not present

## 2016-07-03 DIAGNOSIS — Z800 Family history of malignant neoplasm of digestive organs: Secondary | ICD-10-CM | POA: Diagnosis not present

## 2016-07-03 DIAGNOSIS — K5730 Diverticulosis of large intestine without perforation or abscess without bleeding: Secondary | ICD-10-CM | POA: Insufficient documentation

## 2016-07-03 DIAGNOSIS — K219 Gastro-esophageal reflux disease without esophagitis: Secondary | ICD-10-CM | POA: Insufficient documentation

## 2016-07-03 DIAGNOSIS — I129 Hypertensive chronic kidney disease with stage 1 through stage 4 chronic kidney disease, or unspecified chronic kidney disease: Secondary | ICD-10-CM | POA: Diagnosis not present

## 2016-07-03 DIAGNOSIS — B3781 Candidal esophagitis: Secondary | ICD-10-CM | POA: Insufficient documentation

## 2016-07-03 HISTORY — PX: ESOPHAGOGASTRODUODENOSCOPY (EGD) WITH PROPOFOL: SHX5813

## 2016-07-03 HISTORY — PX: COLONOSCOPY WITH PROPOFOL: SHX5780

## 2016-07-03 LAB — KOH PREP: KOH PREP: NONE SEEN

## 2016-07-03 SURGERY — COLONOSCOPY WITH PROPOFOL
Anesthesia: General

## 2016-07-03 MED ORDER — SODIUM CHLORIDE 0.9 % IV SOLN: INTRAVENOUS | Status: DC

## 2016-07-03 MED ORDER — ONDANSETRON HCL 4 MG/2ML IJ SOLN
INTRAMUSCULAR | Status: DC | PRN
  Administered 2016-07-03: 4 mg via INTRAVENOUS

## 2016-07-03 MED ORDER — PROPOFOL 500 MG/50ML IV EMUL
INTRAVENOUS | Status: AC
  Filled 2016-07-03: qty 50

## 2016-07-03 MED ORDER — PROPOFOL 10 MG/ML IV BOLUS
INTRAVENOUS | Status: DC | PRN
  Administered 2016-07-03: 50 mg via INTRAVENOUS
  Administered 2016-07-03: 20 mg via INTRAVENOUS

## 2016-07-03 MED ORDER — SODIUM CHLORIDE 0.9 % IV SOLN
INTRAVENOUS | Status: DC
  Administered 2016-07-03: 1000 mL via INTRAVENOUS

## 2016-07-03 MED ORDER — MIDAZOLAM HCL 2 MG/2ML IJ SOLN
INTRAMUSCULAR | Status: AC
  Filled 2016-07-03: qty 2

## 2016-07-03 MED ORDER — FENTANYL CITRATE (PF) 100 MCG/2ML IJ SOLN
INTRAMUSCULAR | Status: AC
  Filled 2016-07-03: qty 2

## 2016-07-03 MED ORDER — ONDANSETRON HCL 4 MG/2ML IJ SOLN
INTRAMUSCULAR | Status: AC
  Filled 2016-07-03: qty 2

## 2016-07-03 MED ORDER — PROPOFOL 500 MG/50ML IV EMUL
INTRAVENOUS | Status: DC | PRN
Start: 2016-07-03 — End: 2016-07-03
  Administered 2016-07-03: 125 ug/kg/min via INTRAVENOUS

## 2016-07-03 MED ORDER — GLYCOPYRROLATE 0.2 MG/ML IJ SOLN
INTRAMUSCULAR | Status: DC | PRN
  Administered 2016-07-03: 0.2 mg via INTRAVENOUS

## 2016-07-03 MED ORDER — LIDOCAINE HCL (PF) 2 % IJ SOLN
INTRAMUSCULAR | Status: AC
  Filled 2016-07-03: qty 2
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
## 2016-07-03 NOTE — Transfer of Care (Signed)
Immediate Anesthesia Transfer of Care Note  Patient: Olivia [name]  Procedure(s) Performed: Procedure(s): COLONOSCOPY WITH PROPOFOL (N/A) ESOPHAGOGASTRODUODENOSCOPY (EGD) WITH PROPOFOL (N/A)  Patient Location: PACU  Anesthesia Type:General  Level of Consciousness: awake  Airway & Oxygen Therapy: Patient Spontanous Breathing and Patient connected to nasal cannula oxygen  Post-op Assessment: Report given to RN and Post -op Vital signs reviewed and stable  Post vital signs: Reviewed and stable  Last Vitals:  Vitals:   07/03/16 1252 07/03/16 1530  BP: (!) 149/62 96/78  Pulse: 78 84  Resp: 18 15  Temp: (!) 35.7 C 36.4 C    Last Pain:  Vitals:   07/03/16 1530  TempSrc: Tympanic         Complications: No apparent anesthesia complications

## 2016-07-03 NOTE — Op Note (Addendum)
[facility] Gastroenterology Patient Name: Olivia [name] Procedure Date: 07/03/2016 1:54 PM MRN: [id number] Account #: [id number] Date of Birth: 09-30-1940 Admit Type: Outpatient Age: 76 Room: [facility] ENDO ROOM 3 Gender: Female Note Status: Finalized Procedure:            Colonoscopy Indications:          Personal history of colonic polyps, Change in bowel                        habits Providers:            Lollie Sails, MD Referring MD:         Ivin Poot. Matthew Saras (Referring MD), Rica Mote, MD Medicines:            Monitored Anesthesia Care Complications:        No immediate complications. Procedure:            Pre-Anesthesia Assessment:                       - ASA Grade Assessment: III - A patient with severe                        systemic disease.                       After obtaining informed consent, the colonoscope was                        passed under direct vision. Throughout the procedure,                        the patient's blood pressure, pulse, and oxygen                        saturations were monitored continuously. The                        Colonoscope was introduced through the anus and                        advanced to the the cecum, identified by appendiceal                        orifice and ileocecal valve. The colonoscopy was                        performed with moderate difficulty due to poor bowel                        prep and a tortuous colon. Successful completion of the                        procedure was aided by applying abdominal pressure and                        lavage. The patient tolerated the procedure well. The                        quality of the bowel preparation was good except the  ascending colon was fair. Findings:      Multiple medium-mouthed diverticula were found in the sigmoid colon and       descending colon.      Biopsies for histology were taken with a cold forceps from the right        colon and left colon for evaluation of microscopic colitis.      The digital rectal exam was normal. Impression:           - Diverticulosis in the sigmoid colon and in the                        descending colon.                       - Biopsies were taken with a cold forceps from the                        right colon and left colon for evaluation of                        microscopic colitis. Recommendation:       - Discharge patient to home.                       - Await pathology results.                       - Return to GI clinic in 4 weeks. Procedure Code(s):    --- Professional ---                       [phone], Colonoscopy, flexible; with biopsy, single or                        multiple Diagnosis Code(s):    --- Professional ---                       Z86.010, Personal history of colonic polyps                       R19.4, Change in bowel habit                       K57.30, Diverticulosis of large intestine without                        perforation or abscess without bleeding CPT copyright 2016 [employer]. All rights reserved. The codes documented in this report are preliminary and upon coder review may  be revised to meet current compliance requirements. Lollie Sails, MD 07/03/2016 3:27:33 PM This report has been signed electronically. Number of Addenda: 0 Note Initiated On: 07/03/2016 1:54 PM Scope Withdrawal Time: 0 hours 8 minutes 46 seconds  Total Procedure Duration: 0 hours 18 minutes 49 seconds       [facility]

## 2016-07-03 NOTE — Anesthesia Procedure Notes (Signed)
Performed by: Almer Littleton Pre-anesthesia Checklist: Patient identified, Emergency Drugs available, Suction available, Patient being monitored and Timeout performed Patient Re-evaluated:Patient Re-evaluated prior to inductionOxygen Delivery Method: Nasal cannula Preoxygenation: Pre-oxygenation with 100% oxygen Intubation Type: IV induction       

## 2016-07-03 NOTE — Op Note (Addendum)
[facility] Gastroenterology Patient Name: Olivia [name] Procedure Date: 07/03/2016 2:10 PM MRN: [id number] Account #: [id number] Date of Birth: 18-May-1941 Admit Type: Outpatient Age: 76 Room: [facility] ENDO ROOM 3 Gender: Female Note Status: Finalized Procedure:            Upper GI endoscopy Indications:          Epigastric abdominal pain, Dysphagia Providers:            Lollie Sails, MD Referring MD:         Ivin Poot. Matthew Saras (Referring MD), Rica Mote, MD Medicines:            Monitored Anesthesia Care Complications:        No immediate complications. Procedure:            Pre-Anesthesia Assessment:                       - ASA Grade Assessment: III - A patient with severe                        systemic disease.                       After obtaining informed consent, the endoscope was                        passed under direct vision. Throughout the procedure,                        the patient's blood pressure, pulse, and oxygen                        saturations were monitored continuously. The Endoscope                        was introduced through the mouth, and advanced to the                        second part of duodenum. The upper GI endoscopy was                        accomplished without difficulty. The patient tolerated                        the procedure well. Findings:      Abnormal motility was noted in the middle third of the esophagus and in       the lower third of the esophagus. The cricopharyngeus was normal. There       are extra peristaltic waves in the esophageal body. The distal       esophagus/lower esophageal sphincter is spastic, but gives up passage to       the endoscope. Tertiary peristaltic waves are noted.      Diffuse mild inflammation characterized by congestion (edema) was found       in the gastric body and in the gastric antrum. Biopsies were taken with       a cold forceps for histology. Biopsies were taken with a cold  forceps       for Helicobacter pylori testing.      One non-bleeding superficial gastric ulcer with no stigmata of bleeding       was  found at the incisura. The lesion was 2 mm in largest dimension.       Biopsies were taken with a cold forceps for histology.      The cardia and gastric fundus were normal on retroflexion.      The examined duodenum was normal.      No endoscopic abnormality was evident in the esophagus to explain the       patient's complaint of dysphagia. It was decided, however, to proceed       with dilation of the lower third of the esophagus. A TTS dilator was       passed through the scope. Dilation with a 12-13.5-15 mm balloon dilator       was performed to 15 mm.      The Z-line was regular. Biopsies were taken with a cold forceps for       histology.      Patchy candidiasis was found in the upper third of the esophagus, in the       middle third of the esophagus and in the lower third of the esophagus.       Cells for cytology were obtained by brushing. Impression:           - Abnormal esophageal motility, consistent with                        achalasia.                       - Gastritis. Biopsied.                       - Non-bleeding gastric ulcer with no stigmata of                        bleeding. Biopsied.                       - Normal examined duodenum.                       - No endoscopic esophageal abnormality to explain                        patient's dysphagia. Esophagus dilated. Dilated.                       - Z-line regular. Biopsied.                       - Monilial esophagitis. Cells for cytology obtained. Recommendation:       - Use Protonix (pantoprazole) 40 mg PO BID for 6 weeks.                       - Use Protonix (pantoprazole) 40 mg PO daily daily. Procedure Code(s):    --- Professional ---                       [phone], Esophagogastroduodenoscopy, flexible, transoral;                        with transendoscopic balloon dilation of  esophagus                        (less  than 30 mm diameter)                       [id number], Esophagogastroduodenoscopy, flexible, transoral;                        with biopsy, single or multiple Diagnosis Code(s):    --- Professional ---                       K22.4, Dyskinesia of esophagus                       K29.70, Gastritis, unspecified, without bleeding                       K25.9, Gastric ulcer, unspecified as acute or chronic,                        without hemorrhage or perforation                       R13.10, Dysphagia, unspecified                       B37.81, Candidal esophagitis                       R10.13, Epigastric pain CPT copyright 2016 American Medical Association. All rights reserved. The codes documented in this report are preliminary and upon coder review may  be revised to meet current compliance requirements. Lollie Sails, MD 07/03/2016 3:02:12 PM This report has been signed electronically. Number of Addenda: 0 Note Initiated On: 07/03/2016 2:10 PM      [facility]

## 2016-07-03 NOTE — H&P (Addendum)
Outpatient short stay form Pre-procedure 07/03/2016 2:10 PM Olivia Sails MD  Primary Physician: Jerolyn Center NP  Reason for visit:  EGD and colonoscopy  History of present illness:  Patient is a 76 year old female presenting today as above. She has a personal history of chronic progressive external ophthalmoplegia. He has a history of dysphagia for which she has undergone dilatation several times. She had a barium swallow on 01/02/2016 this showing some mild presbyesophagus as well as some calcifications near the GE junction. There was also a smooth narrowing of the distal esophagus she tolerated her prep for colonoscopy well. He has a family history of colon cancer in her mother, father had colon polyps. She tolerated her prep well. She takes no aspirin products with the exception of 81 mg aspirin that she has held for several days. or blood thinning agents.    Current Facility-Administered Medications:  .  0.9 %  sodium chloride infusion, , Intravenous, Continuous, Olivia Sails, MD, Last Rate: 20 mL/hr at 07/03/16 1313, 1,000 mL at 07/03/16 1313 .  0.9 %  sodium chloride infusion, , Intravenous, Continuous, Olivia Sails, MD  Prescriptions Prior to Admission  Medication Sig Dispense Refill Last Dose  . acetaminophen (TYLENOL) 325 MG tablet Take 650 mg by mouth every 6 (six) hours as needed for mild pain.    07/02/2016 at Unknown time  . Ascorbic Acid (VITAMIN C) 1000 MG tablet Take 2,000 mg by mouth daily.   Past Week at Unknown time  . aspirin 81 MG tablet Take 81 mg by mouth daily.   Past Week at Unknown time  . cholestyramine light (PREVALITE) 4 GM/DOSE powder DISSOLVE 1 SCOOP IN WATER OR LIQUID AND DRINK TWICE DAILY WITH MEALS   Past Week at Unknown time  . DULoxetine (CYMBALTA) 60 MG capsule Take 60 mg by mouth every morning.    Past Week at Unknown time  . fenofibrate (TRICOR) 48 MG tablet Take by mouth.   Past Week at Unknown time  . lamoTRIgine (LAMICTAL) 25 MG tablet  Take 25 mg by mouth daily.   07/02/2016 at Unknown time  . levothyroxine (SYNTHROID, LEVOTHROID) 50 MCG tablet Take 1 tablet (50 mcg total) by mouth daily before breakfast. 30 tablet 2 07/02/2016 at Unknown time  . metoprolol tartrate (LOPRESSOR) 25 MG tablet Take by mouth.   Past Week at Unknown time  . mirabegron ER (MYRBETRIQ) 50 MG TB24 tablet Take 1 tablet (50 mg total) by mouth daily. 30 tablet 11 Past Week at Unknown time  . mirtazapine (REMERON) 30 MG tablet Take 30 mg by mouth at bedtime.    Past Week at Unknown time  . Multiple Vitamin (MULTIVITAMIN) tablet Take 1 tablet by mouth daily.     Past Week at Unknown time  . omeprazole (PRILOSEC) 40 MG capsule Take 40 mg by mouth daily. Reported on 06/20/2015   Past Week at Unknown time  . primidone (MYSOLINE) 50 MG tablet Take by mouth.   Past Week at Unknown time  . Calcium Carbonate-Vitamin D (CALTRATE 600+D PO) Take 1 tablet by mouth daily.   Taking  . lisinopril (PRINIVIL,ZESTRIL) 10 MG tablet Take 10 mg by mouth daily.    Taking  . Melatonin 5 MG TABS Take 1 tablet by mouth at bedtime.   Taking     Allergies  Allergen Reactions  . Neomycin Anaphylaxis    blisters  . Meperidine Hcl     REACTION: UNSPECIFIED  . Pravastatin Sodium  Weakness, resolved after stopping medicine 2013  . Systane [Polyethyl Glycol-Propyl Glycol] Rash     Past Medical History:  Diagnosis Date  . Agitation   . Allergic    Immunotherapy  . Allergic rhinitis   . ALLERGIC RHINITIS 10/26/2006   Qualifier: Diagnosis of  By: Council Mechanic MD, Hilaria Ota   . Anemia, iron deficiency   . Asthma   . Barrett's esophagus 01/04/2011  . Bipolar affective disorder ([place])   . BIPOLAR AFFECTIVE DISORDER, DEPRESSED, HX OF 10/25/2006   Qualifier: Diagnosis of  By: Zara Council LPN, Mariann Laster    . Blood in stool    Via stool cards  . Blood transfusion without reported diagnosis   . Breast cancer ([place]) 1973   Bilateral Mastectomies  . Carpal tunnel syndrome   . Cervical cancer  [facility])    Hysterectomy  . Chest pain   . Chronic kidney disease   . 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 Constipation    Mild  . Depressed   . Diarrhea   . DVT (deep venous thrombosis) ([place])   . Fibromyalgia   . Gastric ulcer    Acute  . Gastritis    Barretts, Stricture  . GERD (gastroesophageal reflux disease)   . Gross hematuria 06/23/2015  . History of kidney stones   . Hyperlipidemia   . Hypertension   . HYPERTENSION 10/26/2006   Qualifier: Diagnosis of  By: Council Mechanic MD, Hilaria Ota   . Hypothyroidism   . 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 Ophthalmoplegia    Dr. Nanine Means at [facility]  . Osteoarthritis    Dr. Marveen Reeks Dr. Jefm Bryant  . Osteoarthrosis    Dr. Marveen Reeks  . PAF (paroxysmal atrial fibrillation) ([place])   . Pain 01/2010   Declined by [facility] pain clinic  . Pneumonia    with trach / 2 YOA and 14 YOA  . PONV (postoperative nausea and vomiting)    past hx.-none recent  . Renal artery stenosis ([place]) 01/04/2011   S/p angioplasty of vessels x2   . Rheumatoid arthritis(714.0)    Dr. Jefm Bryant  . Sinusitis, acute 07/09/2012    Review of systems:      Physical Exam    Heart and lungs: Regular rate and rhythm without rub or gallop, lungs are bilaterally clear.    HEENT: Normocephalic atraumatic eyes are anicteric    Other:     Pertinant exam for procedure: Soft nontender nondistended smile discomfort palpation the epigastric as well as the left lower quadrant. Bowel sounds are positive normoactive there is no masses or rebound noted.    Planned proceedures: EGD, colonoscopy and indicated procedures.  It is of note the patient does have a history of myasthenia gravis however seems to be unaware of this on questioning.    Olivia Sails, MD Gastroenterology 07/03/2016  2:10 PM

## 2016-07-03 NOTE — Anesthesia Post-op Follow-up Note (Cosign Needed)
Anesthesia QCDR form completed.        

## 2016-07-03 NOTE — Anesthesia Postprocedure Evaluation (Signed)
Anesthesia Post Note  Patient: Olivia [name]  Procedure(s) Performed: Procedure(s) (LRB): COLONOSCOPY WITH PROPOFOL (N/A) ESOPHAGOGASTRODUODENOSCOPY (EGD) WITH PROPOFOL (N/A)  Patient location during evaluation: Endoscopy Anesthesia Type: General Level of consciousness: awake and alert Pain management: pain level controlled Vital Signs Assessment: post-procedure vital signs reviewed and stable Respiratory status: spontaneous breathing, nonlabored ventilation, respiratory function stable and patient connected to nasal cannula oxygen Cardiovascular status: blood pressure returned to baseline and stable Postop Assessment: no signs of nausea or vomiting Anesthetic complications: no     Last Vitals:  Vitals:   07/03/16 1550 07/03/16 1600  BP: (!) 155/70   Pulse: 86 80  Resp: 18 14  Temp:      Last Pain:  Vitals:   07/03/16 1530  TempSrc: Tympanic                 Martha Clan

## 2016-07-04 ENCOUNTER — Encounter: Payer: Self-pay | Admitting: Gastroenterology

## 2016-07-05 LAB — SURGICAL PATHOLOGY

## 2016-08-08 ENCOUNTER — Encounter (INDEPENDENT_AMBULATORY_CARE_PROVIDER_SITE_OTHER): Payer: Medicare Other

## 2016-08-09 ENCOUNTER — Other Ambulatory Visit (INDEPENDENT_AMBULATORY_CARE_PROVIDER_SITE_OTHER): Payer: Self-pay | Admitting: Nephrology

## 2016-08-09 ENCOUNTER — Other Ambulatory Visit (INDEPENDENT_AMBULATORY_CARE_PROVIDER_SITE_OTHER): Payer: Medicare Other

## 2016-08-09 DIAGNOSIS — N183 Chronic kidney disease, stage 3 unspecified: Secondary | ICD-10-CM

## 2016-08-09 DIAGNOSIS — I10 Essential (primary) hypertension: Secondary | ICD-10-CM | POA: Diagnosis not present

## 2016-08-09 DIAGNOSIS — I701 Atherosclerosis of renal artery: Secondary | ICD-10-CM | POA: Diagnosis not present

## 2016-10-09 ENCOUNTER — Emergency Department
Admission: EM | Admit: 2016-10-09 | Discharge: 2016-10-09 | Disposition: A | Discharge status: Home / Self Care | Payer: Medicare Other | Attending: Emergency Medicine | Admitting: Emergency Medicine

## 2016-10-09 ENCOUNTER — Encounter: Payer: Self-pay | Admitting: Emergency Medicine

## 2016-10-09 ENCOUNTER — Emergency Department: Payer: Medicare Other

## 2016-10-09 ENCOUNTER — Other Ambulatory Visit: Payer: Self-pay | Admitting: Orthopedic Surgery

## 2016-10-09 DIAGNOSIS — I129 Hypertensive chronic kidney disease with stage 1 through stage 4 chronic kidney disease, or unspecified chronic kidney disease: Secondary | ICD-10-CM | POA: Insufficient documentation

## 2016-10-09 DIAGNOSIS — Z8541 Personal history of malignant neoplasm of cervix uteri: Secondary | ICD-10-CM | POA: Insufficient documentation

## 2016-10-09 DIAGNOSIS — Y929 Unspecified place or not applicable: Secondary | ICD-10-CM | POA: Diagnosis not present

## 2016-10-09 DIAGNOSIS — S0093XA Contusion of unspecified part of head, initial encounter: Secondary | ICD-10-CM | POA: Insufficient documentation

## 2016-10-09 DIAGNOSIS — Y9389 Activity, other specified: Secondary | ICD-10-CM | POA: Diagnosis not present

## 2016-10-09 DIAGNOSIS — Z853 Personal history of malignant neoplasm of breast: Secondary | ICD-10-CM | POA: Diagnosis not present

## 2016-10-09 DIAGNOSIS — J45909 Unspecified asthma, uncomplicated: Secondary | ICD-10-CM | POA: Insufficient documentation

## 2016-10-09 DIAGNOSIS — S0990XA Unspecified injury of head, initial encounter: Secondary | ICD-10-CM

## 2016-10-09 DIAGNOSIS — N189 Chronic kidney disease, unspecified: Secondary | ICD-10-CM | POA: Diagnosis not present

## 2016-10-09 DIAGNOSIS — Z79899 Other long term (current) drug therapy: Secondary | ICD-10-CM | POA: Diagnosis not present

## 2016-10-09 DIAGNOSIS — Z7982 Long term (current) use of aspirin: Secondary | ICD-10-CM | POA: Insufficient documentation

## 2016-10-09 DIAGNOSIS — Y990 Civilian activity done for income or pay: Secondary | ICD-10-CM | POA: Diagnosis not present

## 2016-10-09 DIAGNOSIS — W1809XA Striking against other object with subsequent fall, initial encounter: Secondary | ICD-10-CM | POA: Diagnosis not present

## 2016-10-09 DIAGNOSIS — E039 Hypothyroidism, unspecified: Secondary | ICD-10-CM | POA: Insufficient documentation

## 2016-10-09 MED ORDER — KETOROLAC TROMETHAMINE 60 MG/2ML IM SOLN
30.0000 mg | Freq: Once | INTRAMUSCULAR | Status: AC
  Administered 2016-10-09: 30 mg via INTRAMUSCULAR

## 2016-10-09 MED ORDER — BUTALBITAL-APAP-CAFFEINE 50-325-40 MG PO TABS: 1.0000 | ORAL_TABLET | Freq: Four times a day (QID) | ORAL | 0 refills | Status: DC | PRN

## 2016-10-09 NOTE — ED Notes (Signed)
Pt with no complaints except head pain. PERRL. Full ROM
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
## 2016-10-09 NOTE — ED Triage Notes (Signed)
Pt was getting oil changed and reports they closed garage door with her under and it hit her in the head knocking her back. When she fell back hit the back of her head on concrete. Few seconds LOC per pt.  No nausea or vomiting. Not on blood thinners. No bleeding.  Pt ambulatory to triage. No neck pain.

## 2016-10-09 NOTE — Discharge Instructions (Signed)
Advised nonnarcotic medications for the next 24 hours. Patient advised to take extra strength Tylenol and return back to ER for condition worsens.

## 2016-10-10 ENCOUNTER — Other Ambulatory Visit: Payer: Self-pay | Admitting: Orthopedic Surgery

## 2016-11-02 ENCOUNTER — Other Ambulatory Visit: Payer: Self-pay | Admitting: Nurse Practitioner

## 2016-11-02 DIAGNOSIS — S0990XA Unspecified injury of head, initial encounter: Secondary | ICD-10-CM

## 2016-11-08 ENCOUNTER — Ambulatory Visit: Payer: Medicare Other

## 2016-11-26 ENCOUNTER — Other Ambulatory Visit: Payer: Self-pay | Admitting: Nurse Practitioner

## 2016-11-26 DIAGNOSIS — Z1231 Encounter for screening mammogram for malignant neoplasm of breast: Secondary | ICD-10-CM

## 2017-01-07 ENCOUNTER — Ambulatory Visit
Admission: RE | Admit: 2017-01-07 | Discharge: 2017-01-07 | Disposition: A | Discharge status: Home / Self Care | Payer: Medicare Other | Source: Ambulatory Visit | Attending: Nurse Practitioner | Admitting: Nurse Practitioner

## 2017-01-07 ENCOUNTER — Other Ambulatory Visit: Payer: Self-pay | Admitting: Nurse Practitioner

## 2017-01-07 DIAGNOSIS — Z1231 Encounter for screening mammogram for malignant neoplasm of breast: Secondary | ICD-10-CM | POA: Diagnosis not present

## 2017-02-25 ENCOUNTER — Other Ambulatory Visit: Payer: Self-pay | Admitting: Neurology

## 2017-02-25 DIAGNOSIS — R51 Headache: Secondary | ICD-10-CM

## 2017-02-25 DIAGNOSIS — R519 Headache, unspecified: Secondary | ICD-10-CM

## 2017-02-25 DIAGNOSIS — R413 Other amnesia: Secondary | ICD-10-CM

## 2017-02-25 DIAGNOSIS — W19XXXA Unspecified fall, initial encounter: Secondary | ICD-10-CM

## 2017-02-25 DIAGNOSIS — H53149 Visual discomfort, unspecified: Secondary | ICD-10-CM

## 2017-03-03 ENCOUNTER — Ambulatory Visit
Admission: RE | Admit: 2017-03-03 | Discharge: 2017-03-03 | Disposition: A | Discharge status: Home / Self Care | Payer: Medicare Other | Source: Ambulatory Visit | Attending: Neurology | Admitting: Neurology

## 2017-03-03 DIAGNOSIS — W19XXXA Unspecified fall, initial encounter: Secondary | ICD-10-CM

## 2017-03-03 DIAGNOSIS — R51 Headache: Secondary | ICD-10-CM

## 2017-03-03 DIAGNOSIS — R413 Other amnesia: Secondary | ICD-10-CM

## 2017-03-03 DIAGNOSIS — R519 Headache, unspecified: Secondary | ICD-10-CM

## 2017-03-03 DIAGNOSIS — H53149 Visual discomfort, unspecified: Secondary | ICD-10-CM

## 2017-03-03 MED ORDER — GADOBENATE DIMEGLUMINE 529 MG/ML IV SOLN
15.0000 mL | Freq: Once | INTRAVENOUS | Status: AC | PRN
  Administered 2017-03-03: 15 mL via INTRAVENOUS

## 2017-03-18 ENCOUNTER — Other Ambulatory Visit: Payer: Self-pay | Admitting: Neurology

## 2017-03-18 DIAGNOSIS — M5090 Cervical disc disorder, unspecified, unspecified cervical region: Secondary | ICD-10-CM

## 2017-03-18 DIAGNOSIS — M542 Cervicalgia: Secondary | ICD-10-CM

## 2017-03-23 ENCOUNTER — Ambulatory Visit
Admission: RE | Admit: 2017-03-23 | Discharge: 2017-03-23 | Disposition: A | Discharge status: Home / Self Care | Payer: Medicare Other | Source: Ambulatory Visit | Attending: Neurology | Admitting: Neurology

## 2017-03-23 DIAGNOSIS — M4802 Spinal stenosis, cervical region: Secondary | ICD-10-CM | POA: Insufficient documentation

## 2017-03-23 DIAGNOSIS — M5031 Other cervical disc degeneration,  high cervical region: Secondary | ICD-10-CM | POA: Diagnosis not present

## 2017-03-23 DIAGNOSIS — M542 Cervicalgia: Secondary | ICD-10-CM

## 2017-03-23 DIAGNOSIS — M5090 Cervical disc disorder, unspecified, unspecified cervical region: Secondary | ICD-10-CM | POA: Diagnosis not present

## 2017-05-02 ENCOUNTER — Ambulatory Visit: Payer: Medicare Other | Attending: Neurosurgery | Admitting: Speech Pathology

## 2017-05-02 DIAGNOSIS — R41841 Cognitive communication deficit: Secondary | ICD-10-CM

## 2017-05-03 ENCOUNTER — Other Ambulatory Visit: Payer: Self-pay

## 2017-05-03 ENCOUNTER — Encounter: Payer: Self-pay | Admitting: Speech Pathology
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
## 2017-05-07 ENCOUNTER — Encounter: Payer: Medicare Other | Admitting: Speech Pathology

## 2017-05-09 ENCOUNTER — Encounter: Payer: Medicare Other | Admitting: Speech Pathology

## 2017-05-14 ENCOUNTER — Encounter: Payer: Medicare Other | Admitting: Speech Pathology

## 2017-05-16 ENCOUNTER — Encounter: Payer: Medicare Other | Admitting: Speech Pathology

## 2017-05-23 ENCOUNTER — Encounter: Payer: Medicare Other | Admitting: Speech Pathology

## 2017-05-27 ENCOUNTER — Encounter: Payer: Medicare Other | Admitting: Speech Pathology

## 2017-05-30 ENCOUNTER — Encounter: Payer: Medicare Other | Admitting: Speech Pathology

## 2017-06-03 ENCOUNTER — Encounter: Payer: Medicare Other | Admitting: Speech Pathology

## 2017-06-05 ENCOUNTER — Encounter: Payer: Medicare Other | Admitting: Speech Pathology

## 2017-12-23 ENCOUNTER — Encounter: Payer: Self-pay | Admitting: Urology

## 2017-12-23 ENCOUNTER — Ambulatory Visit (INDEPENDENT_AMBULATORY_CARE_PROVIDER_SITE_OTHER): Payer: Medicare Other | Admitting: Urology

## 2017-12-23 VITALS — BP 145/72 | HR 74 | Ht 63.0 in | Wt 174.8 lb

## 2017-12-23 DIAGNOSIS — N3946 Mixed incontinence: Secondary | ICD-10-CM

## 2017-12-23 MED ORDER — CIPROFLOXACIN HCL 250 MG PO TABS: 250.0000 mg | ORAL_TABLET | Freq: Two times a day (BID) | ORAL | 0 refills | Status: DC
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
## 2018-01-13 ENCOUNTER — Ambulatory Visit (INDEPENDENT_AMBULATORY_CARE_PROVIDER_SITE_OTHER): Payer: Medicare Other | Admitting: Psychology

## 2018-01-13 DIAGNOSIS — F330 Major depressive disorder, recurrent, mild: Secondary | ICD-10-CM

## 2018-01-21 ENCOUNTER — Ambulatory Visit (INDEPENDENT_AMBULATORY_CARE_PROVIDER_SITE_OTHER): Payer: Medicare Other | Admitting: Psychology

## 2018-01-21 DIAGNOSIS — F330 Major depressive disorder, recurrent, mild: Secondary | ICD-10-CM | POA: Diagnosis not present

## 2018-01-28 ENCOUNTER — Other Ambulatory Visit: Payer: Medicare Other

## 2018-01-28 ENCOUNTER — Other Ambulatory Visit: Payer: Self-pay

## 2018-01-28 DIAGNOSIS — Z01818 Encounter for other preprocedural examination: Secondary | ICD-10-CM

## 2018-01-30 LAB — CULTURE, URINE COMPREHENSIVE

## 2018-02-03 ENCOUNTER — Ambulatory Visit (INDEPENDENT_AMBULATORY_CARE_PROVIDER_SITE_OTHER): Payer: Medicare Other | Admitting: Urology

## 2018-02-03 ENCOUNTER — Encounter: Payer: Self-pay | Admitting: Urology

## 2018-02-03 VITALS — BP 131/81 | HR 73 | Ht 63.0 in | Wt 175.0 lb

## 2018-02-03 DIAGNOSIS — N3946 Mixed incontinence: Secondary | ICD-10-CM | POA: Diagnosis not present

## 2018-02-03 MED ORDER — ONABOTULINUMTOXINA 100 UNITS IJ SOLR
100.0000 [IU] | Freq: Once | INTRAMUSCULAR | Status: AC
  Administered 2018-02-03: 100 [IU] via INTRAMUSCULAR

## 2018-02-03 MED ORDER — LIDOCAINE HCL URETHRAL/MUCOSAL 2 % EX GEL
1.0000 | Freq: Once | CUTANEOUS | Status: AC
Start: 2018-02-03 — End: 2018-02-03
  Administered 2018-02-03: 1 via URETHRAL

## 2018-02-03 MED ORDER — CIPROFLOXACIN HCL 500 MG PO TABS
500.0000 mg | ORAL_TABLET | Freq: Once | ORAL | Status: AC
  Administered 2018-02-03: 500 mg via ORAL

## 2018-02-03 MED ORDER — LIDOCAINE HCL 2 % IJ SOLN
60.0000 mL | Freq: Once | INTRAMUSCULAR | Status: AC
  Administered 2018-02-03: 1200 mg

## 2018-02-03 MED ORDER — CIPROFLOXACIN HCL 250 MG PO TABS: 250.0000 mg | ORAL_TABLET | Freq: Two times a day (BID) | ORAL | 0 refills | Status: DC

## 2018-02-03 NOTE — Addendum Note (Signed)
Addended by: Tommy Rainwater on: 02/03/2018 01:33 PM   Modules accepted: Orders

## 2018-02-03 NOTE — Addendum Note (Signed)
Addended by: Tommy Rainwater on: 02/03/2018 03:05 PM   Modules accepted: Orders
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
## 2018-02-03 NOTE — Progress Notes (Signed)
In and Out Catheterization  Patient is present today for a Botox procedure. Prior to procedure patient was cleaned and prepped in a sterile fashion with betadine and Lidocaine 2% jelly was instilled into the urethra.  A 14FR cath was inserted no complications were noted , 84ml of urine return was noted, urine was yellow in color. Lidocaine 2% 91ml was instilled into the bladder through catheter,  And catheter was removed with out difficulty.  Patient was repositioned and waited for 109min with Lidocaine in the bladder prior to starting procedure.   Preformed by: Alberteen Sam, CMA

## 2018-02-04 ENCOUNTER — Ambulatory Visit (INDEPENDENT_AMBULATORY_CARE_PROVIDER_SITE_OTHER): Payer: Medicare Other | Admitting: Psychology

## 2018-02-04 DIAGNOSIS — F330 Major depressive disorder, recurrent, mild: Secondary | ICD-10-CM | POA: Diagnosis not present

## 2018-02-17 ENCOUNTER — Ambulatory Visit (INDEPENDENT_AMBULATORY_CARE_PROVIDER_SITE_OTHER): Payer: Medicare Other | Admitting: Psychology

## 2018-02-17 DIAGNOSIS — F330 Major depressive disorder, recurrent, mild: Secondary | ICD-10-CM

## 2018-02-19 ENCOUNTER — Ambulatory Visit (INDEPENDENT_AMBULATORY_CARE_PROVIDER_SITE_OTHER): Payer: Medicare Other

## 2018-02-19 DIAGNOSIS — N3946 Mixed incontinence: Secondary | ICD-10-CM

## 2018-02-19 LAB — BLADDER SCAN AMB NON-IMAGING: Scan Result: 0

## 2018-02-19 NOTE — Progress Notes (Signed)
Pt is present for a two week pvr post bladder botox. PVR noted 59ml of urine present. Pt states she can tell a difference and feels the procedure has helped.

## 2018-03-04 ENCOUNTER — Ambulatory Visit (INDEPENDENT_AMBULATORY_CARE_PROVIDER_SITE_OTHER): Payer: Medicare Other | Admitting: Psychology

## 2018-03-04 DIAGNOSIS — F330 Major depressive disorder, recurrent, mild: Secondary | ICD-10-CM | POA: Diagnosis not present

## 2018-03-17 ENCOUNTER — Ambulatory Visit (INDEPENDENT_AMBULATORY_CARE_PROVIDER_SITE_OTHER): Payer: Medicare Other | Admitting: Urology

## 2018-03-17 ENCOUNTER — Encounter: Payer: Self-pay | Admitting: Urology

## 2018-03-17 VITALS — BP 130/71 | HR 73 | Ht 62.0 in | Wt 175.0 lb

## 2018-03-17 DIAGNOSIS — N3946 Mixed incontinence: Secondary | ICD-10-CM | POA: Diagnosis not present
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
## 2018-03-18 ENCOUNTER — Ambulatory Visit (INDEPENDENT_AMBULATORY_CARE_PROVIDER_SITE_OTHER): Payer: Medicare Other | Admitting: Psychology

## 2018-03-18 DIAGNOSIS — F330 Major depressive disorder, recurrent, mild: Secondary | ICD-10-CM | POA: Diagnosis not present

## 2018-04-08 ENCOUNTER — Ambulatory Visit (INDEPENDENT_AMBULATORY_CARE_PROVIDER_SITE_OTHER): Payer: Medicare Other | Admitting: Psychology

## 2018-04-08 DIAGNOSIS — F330 Major depressive disorder, recurrent, mild: Secondary | ICD-10-CM | POA: Diagnosis not present

## 2018-04-29 ENCOUNTER — Ambulatory Visit (INDEPENDENT_AMBULATORY_CARE_PROVIDER_SITE_OTHER): Payer: Medicare Other | Admitting: Psychology

## 2018-04-29 DIAGNOSIS — F330 Major depressive disorder, recurrent, mild: Secondary | ICD-10-CM | POA: Diagnosis not present

## 2018-06-03 ENCOUNTER — Ambulatory Visit (INDEPENDENT_AMBULATORY_CARE_PROVIDER_SITE_OTHER): Payer: Medicare Other | Admitting: Psychology

## 2018-06-03 DIAGNOSIS — F330 Major depressive disorder, recurrent, mild: Secondary | ICD-10-CM | POA: Diagnosis not present

## 2018-06-26 ENCOUNTER — Other Ambulatory Visit: Payer: Self-pay | Admitting: Nurse Practitioner

## 2018-06-26 DIAGNOSIS — M545 Low back pain, unspecified: Secondary | ICD-10-CM

## 2018-06-29 ENCOUNTER — Encounter: Payer: Self-pay | Admitting: Emergency Medicine

## 2018-06-29 DIAGNOSIS — Z8541 Personal history of malignant neoplasm of cervix uteri: Secondary | ICD-10-CM | POA: Diagnosis not present

## 2018-06-29 DIAGNOSIS — Z853 Personal history of malignant neoplasm of breast: Secondary | ICD-10-CM | POA: Insufficient documentation

## 2018-06-29 DIAGNOSIS — I129 Hypertensive chronic kidney disease with stage 1 through stage 4 chronic kidney disease, or unspecified chronic kidney disease: Secondary | ICD-10-CM | POA: Insufficient documentation

## 2018-06-29 DIAGNOSIS — R112 Nausea with vomiting, unspecified: Secondary | ICD-10-CM | POA: Diagnosis present

## 2018-06-29 DIAGNOSIS — K529 Noninfective gastroenteritis and colitis, unspecified: Secondary | ICD-10-CM | POA: Insufficient documentation

## 2018-06-29 DIAGNOSIS — E039 Hypothyroidism, unspecified: Secondary | ICD-10-CM | POA: Insufficient documentation

## 2018-06-29 DIAGNOSIS — Z9049 Acquired absence of other specified parts of digestive tract: Secondary | ICD-10-CM | POA: Diagnosis not present

## 2018-06-29 DIAGNOSIS — F319 Bipolar disorder, unspecified: Secondary | ICD-10-CM | POA: Diagnosis not present

## 2018-06-29 DIAGNOSIS — J45909 Unspecified asthma, uncomplicated: Secondary | ICD-10-CM | POA: Diagnosis not present

## 2018-06-29 DIAGNOSIS — Z79899 Other long term (current) drug therapy: Secondary | ICD-10-CM | POA: Diagnosis not present

## 2018-06-29 DIAGNOSIS — N189 Chronic kidney disease, unspecified: Secondary | ICD-10-CM | POA: Diagnosis not present

## 2018-06-29 DIAGNOSIS — Z7982 Long term (current) use of aspirin: Secondary | ICD-10-CM | POA: Insufficient documentation

## 2018-06-29 LAB — CBC
HCT: 36.4 % (ref 36.0–46.0)
Hemoglobin: 11.5 g/dL — ABNORMAL LOW (ref 12.0–15.0)
MCH: 27.4 pg (ref 26.0–34.0)
MCHC: 31.6 g/dL (ref 30.0–36.0)
MCV: 86.9 fL (ref 80.0–100.0)
NRBC: 0 % (ref 0.0–0.2)
Platelets: 542 10*3/uL — ABNORMAL HIGH (ref 150–400)
RBC: 4.19 MIL/uL (ref 3.87–5.11)
RDW: 13.2 % (ref 11.5–15.5)
WBC: 12.1 10*3/uL — ABNORMAL HIGH (ref 4.0–10.5)

## 2018-06-29 LAB — COMPREHENSIVE METABOLIC PANEL
ALK PHOS: 36 U/L — AB (ref 38–126)
ALT: 25 U/L (ref 0–44)
ANION GAP: 16 — AB (ref 5–15)
AST: 43 U/L — ABNORMAL HIGH (ref 15–41)
Albumin: 4.8 g/dL (ref 3.5–5.0)
BILIRUBIN TOTAL: 1.1 mg/dL (ref 0.3–1.2)
BUN: 26 mg/dL — ABNORMAL HIGH (ref 8–23)
CALCIUM: 9.7 mg/dL (ref 8.9–10.3)
CO2: 21 mmol/L — ABNORMAL LOW (ref 22–32)
CREATININE: 1.02 mg/dL — AB (ref 0.44–1.00)
Chloride: 104 mmol/L (ref 98–111)
GFR calc non Af Amer: 53 mL/min — ABNORMAL LOW (ref >60)
GLUCOSE: 173 mg/dL — AB (ref 70–99)
Potassium: 3.8 mmol/L (ref 3.5–5.1)
Sodium: 141 mmol/L (ref 135–145)
TOTAL PROTEIN: 7.9 g/dL (ref 6.5–8.1)

## 2018-06-29 LAB — LIPASE, BLOOD: Lipase: 45 U/L (ref 11–51)

## 2018-06-29 MED ORDER — ONDANSETRON 4 MG PO TBDP
4.0000 mg | ORAL_TABLET | Freq: Once | ORAL | Status: AC | PRN
  Administered 2018-06-29: 4 mg via ORAL

## 2018-06-29 MED ORDER — ONDANSETRON 4 MG PO TBDP
ORAL_TABLET | ORAL | Status: AC
  Filled 2018-06-29: qty 1

## 2018-06-29 NOTE — ED Triage Notes (Signed)
Patient reports vomiting and diarrhea that started last night. Patient states that she has not been able to tolerate fluids today.

## 2018-06-30 ENCOUNTER — Emergency Department: Payer: Medicare Other

## 2018-06-30 ENCOUNTER — Emergency Department
Admission: EM | Admit: 2018-06-30 | Discharge: 2018-06-30 | Disposition: A | Discharge status: Home / Self Care | Payer: Medicare Other | Attending: Emergency Medicine | Admitting: Emergency Medicine

## 2018-06-30 DIAGNOSIS — K529 Noninfective gastroenteritis and colitis, unspecified: Secondary | ICD-10-CM

## 2018-06-30 LAB — GASTROINTESTINAL PANEL BY PCR, STOOL (REPLACES STOOL CULTURE)

## 2018-06-30 LAB — URINALYSIS, COMPLETE (UACMP) WITH MICROSCOPIC
Bilirubin Urine: NEGATIVE
GLUCOSE, UA: NEGATIVE mg/dL
Hgb urine dipstick: NEGATIVE
Ketones, ur: 5 mg/dL — AB
Nitrite: NEGATIVE
PROTEIN: NEGATIVE mg/dL
Specific Gravity, Urine: 1.023 (ref 1.005–1.030)
WAXY CASTS UA: 1
pH: 5 (ref 5.0–8.0)

## 2018-06-30 LAB — C DIFFICILE QUICK SCREEN W PCR REFLEX
C Diff antigen: NEGATIVE
C Diff interpretation: NOT DETECTED
C Diff toxin: NEGATIVE

## 2018-06-30 MED ORDER — ONDANSETRON 4 MG PO TBDP: 4.0000 mg | ORAL_TABLET | Freq: Three times a day (TID) | ORAL | 0 refills | Status: DC | PRN

## 2018-06-30 MED ORDER — SODIUM CHLORIDE 0.9 % IV BOLUS
1000.0000 mL | Freq: Once | INTRAVENOUS | Status: AC
  Administered 2018-06-30: 1000 mL via INTRAVENOUS

## 2018-06-30 MED ORDER — ONDANSETRON HCL 4 MG/2ML IJ SOLN
4.0000 mg | Freq: Once | INTRAMUSCULAR | Status: AC
  Administered 2018-06-30: 4 mg via INTRAVENOUS
  Filled 2018-06-30: qty 2
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
## 2018-06-30 NOTE — ED Notes (Signed)
Pt states that she woke up around 2:00am and had nausea and diarrhea. Husband at bedside.

## 2018-06-30 NOTE — ED Notes (Addendum)
Pt up to the registration desk discussing her wait time, pt's gait is steady and pt able to stand and talk without difficulty, pt stating that she "will just go home and puke in my own bucket" pt states she "has a secret to tell us, my daughter is VP of HR in [place] over all the hospitals, that she can fire all of [place] if she wants to, all of ya'll, it is always a long wait every time I come and I don't ever need ya'll until the weekend." pt states, "next time I will just go to [facility]"  This RN apologized for the wait and vs rechecked, pt's lips are noted to be dry

## 2018-07-01 ENCOUNTER — Ambulatory Visit: Payer: Medicare Other | Admitting: Psychology

## 2018-07-08 ENCOUNTER — Ambulatory Visit
Admission: RE | Admit: 2018-07-08 | Discharge: 2018-07-08 | Disposition: A | Discharge status: Home / Self Care | Payer: Medicare Other | Source: Ambulatory Visit | Attending: Nurse Practitioner | Admitting: Nurse Practitioner

## 2018-07-08 DIAGNOSIS — M545 Low back pain, unspecified: Secondary | ICD-10-CM

## 2018-07-08 MED ORDER — GADOBUTROL 1 MMOL/ML IV SOLN
7.0000 mL | Freq: Once | INTRAVENOUS | Status: AC | PRN
  Administered 2018-07-08: 7 mL via INTRAVENOUS

## 2018-07-15 ENCOUNTER — Ambulatory Visit (INDEPENDENT_AMBULATORY_CARE_PROVIDER_SITE_OTHER): Payer: Medicare Other | Admitting: Psychology

## 2018-07-15 DIAGNOSIS — F330 Major depressive disorder, recurrent, mild: Secondary | ICD-10-CM

## 2018-07-21 ENCOUNTER — Encounter: Payer: Self-pay | Admitting: Urology

## 2018-07-21 ENCOUNTER — Ambulatory Visit (INDEPENDENT_AMBULATORY_CARE_PROVIDER_SITE_OTHER): Payer: Medicare Other | Admitting: Urology

## 2018-07-21 VITALS — BP 126/74 | HR 69 | Ht 63.0 in | Wt 175.0 lb

## 2018-07-21 DIAGNOSIS — N3946 Mixed incontinence: Secondary | ICD-10-CM
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
## 2018-08-19 ENCOUNTER — Ambulatory Visit: Payer: Medicare Other | Admitting: Psychology

## 2018-09-18 ENCOUNTER — Telehealth: Payer: Self-pay | Admitting: *Deleted

## 2018-09-18 NOTE — Telephone Encounter (Signed)
Virtual Visit Pre-Appointment Phone Call  "(Name), I am calling you today to discuss your upcoming appointment. We are currently trying to limit exposure to the virus that causes COVID-19 by seeing patients at home rather than in the office."  1. "What is the BEST phone number to call the day of the visit?" - [phone]   2. "Do you have or have access to (through a family member/friend) a smartphone with video capability that we can use for your visit?" a. If yes - list this number in appt notes as "cell" (if different from BEST phone #) and list the appointment type as a VIDEO visit in appointment notes b. If no - list the appointment type as a PHONE visit in appointment notes  3. Confirm consent - "In the setting of the current Covid19 crisis, you are scheduled for a (Telephone) visit with your provider on (09/22/2018) at (1:30 pm).  Just as we do with many in-office visits, in order for you to participate in this visit, we must obtain consent.  If you'd like, I can send this to your mychart (if signed up) or email for you to review.  Otherwise, I can obtain your verbal consent now.  All virtual visits are billed to your insurance company just like a normal visit would be.  By agreeing to a virtual visit, we'd like you to understand that the technology does not allow for your provider to perform an examination, and thus may limit your provider's ability to fully assess your condition. If your provider identifies any concerns that need to be evaluated in person, we will make arrangements to do so.  Finally, though the technology is pretty good, we cannot assure that it will always work on either your or our end, and in the setting of a video visit, we may have to convert it to a phone-only visit.  In either situation, we cannot ensure that we have a secure connection.  Are you willing to proceed?" YES  4. Advise patient to be prepared - "Two hours prior to your appointment, go ahead and check  your blood pressure, pulse, oxygen saturation, and your weight (if you have the equipment to check those) and write them all down. When your visit starts, your provider will ask you for this information. If you have an Apple Watch or Kardia device, please plan to have heart rate information ready on the day of your appointment. Please have a pen and paper handy nearby the day of the visit as well."  5. Give patient instructions for MyChart download to smartphone OR Doximity/Doxy.me as below if video visit (depending on what platform provider is using)  6. Inform patient they will receive a phone call 15 minutes prior to their appointment time (may be from unknown caller ID) so they should be prepared to answer    TELEPHONE CALL NOTE  Olivia [name] has been deemed a candidate for a follow-up tele-health visit to limit community exposure during the Covid-19 pandemic. I spoke with the patient via phone to ensure availability of phone/video source, confirm preferred email & phone number, and discuss instructions and expectations.  I reminded Olivia [name] to be prepared with any vital sign and/or heart rhythm information that could potentially be obtained via home monitoring, at the time of her visit. I reminded Olivia [name] to expect a phone call prior to her visit.  Olivia [name] C, CMA 09/18/2018 2:16 PM   INSTRUCTIONS FOR DOWNLOADING THE [facility]  APP TO SMARTPHONE  - The patient must first make sure to have activated MyChart and know their login information - If Apple, go to [employer] and type in MyChart in the search bar and download the app. If Android, ask patient to go to [employer] and type in [place] in the search bar and download the app. The app is free but as with any other app downloads, their phone may require them to verify saved payment information or Apple/Android password.  - The patient will need to then log into the app with their MyChart username and password, and  select [facility] as their healthcare provider to link the account. When it is time for your visit, go to the MyChart app, find appointments, and click Begin Video Visit. Be sure to Select Allow for your device to access the Microphone and Camera for your visit. You will then be connected, and your provider will be with you shortly.  **If they have any issues connecting, or need assistance please contact MyChart service desk [phone])**  **If using a computer, in order to ensure the best quality for their visit they will need to use either of the following Internet Browsers: [employer], or Google Chrome**  IF USING DOXIMITY or DOXY.ME - The patient will receive a link just prior to their visit by text.     FULL LENGTH CONSENT FOR TELE-HEALTH VISIT   I hereby voluntarily request, consent and authorize [facility] and its employed or contracted physicians, physician assistants, nurse practitioners or other licensed health care professionals (the Practitioner), to provide me with telemedicine health care services (the "Services") as deemed necessary by the treating Practitioner. I acknowledge and consent to receive the Services by the Practitioner via telemedicine. I understand that the telemedicine visit will involve communicating with the Practitioner through live audiovisual communication technology and the disclosure of certain medical information by electronic transmission. I acknowledge that I have been given the opportunity to request an in-person assessment or other available alternative prior to the telemedicine visit and am voluntarily participating in the telemedicine visit.  I understand that I have the right to withhold or withdraw my consent to the use of telemedicine in the course of my care at any time, without affecting my right to future care or treatment, and that the Practitioner or I may terminate the telemedicine visit at any time. I understand that I have  the right to inspect all information obtained and/or recorded in the course of the telemedicine visit and may receive copies of available information for a reasonable fee.  I understand that some of the potential risks of receiving the Services via telemedicine include:  Marland Kitchen Delay or interruption in medical evaluation due to technological equipment failure or disruption; . Information transmitted may not be sufficient (e.g. poor resolution of images) to allow for appropriate medical decision making by the Practitioner; and/or  . In rare instances, security protocols could fail, causing a breach of personal health information.  Furthermore, I acknowledge that it is my responsibility to provide information about my medical history, conditions and care that is complete and accurate to the best of my ability. I acknowledge that Practitioner's advice, recommendations, and/or decision may be based on factors not within their control, such as incomplete or inaccurate data provided by me or distortions of diagnostic images or specimens that may result from electronic transmissions. I understand that the practice of medicine is not an exact science and that Practitioner makes no  warranties or guarantees regarding treatment outcomes. I acknowledge that I will receive a copy of this consent concurrently upon execution via email to the email address I last provided but may also request a printed copy by calling the office of [facility].    I understand that my insurance will be billed for this visit.   I have read or had this consent read to me. . I understand the contents of this consent, which adequately explains the benefits and risks of the Services being provided via telemedicine.  . I have been provided ample opportunity to ask questions regarding this consent and the Services and have had my questions answered to my satisfaction. . I give my informed consent for the services to be provided through the use  of telemedicine in my medical care  By participating in this telemedicine visit I agree to the above.

## 2018-09-22 ENCOUNTER — Encounter: Payer: Self-pay | Admitting: Internal Medicine

## 2018-09-22 ENCOUNTER — Telehealth (INDEPENDENT_AMBULATORY_CARE_PROVIDER_SITE_OTHER): Payer: Medicare Other | Admitting: Internal Medicine

## 2018-09-22 ENCOUNTER — Other Ambulatory Visit: Payer: Self-pay

## 2018-09-22 VITALS — BP 133/71 | HR 83 | Ht 63.0 in | Wt 176.0 lb

## 2018-09-22 DIAGNOSIS — E782 Mixed hyperlipidemia: Secondary | ICD-10-CM

## 2018-09-22 DIAGNOSIS — R002 Palpitations: Secondary | ICD-10-CM | POA: Diagnosis not present

## 2018-09-22 DIAGNOSIS — I701 Atherosclerosis of renal artery: Secondary | ICD-10-CM

## 2018-09-22 DIAGNOSIS — I10 Essential (primary) hypertension: Secondary | ICD-10-CM | POA: Diagnosis not present

## 2018-09-22 DIAGNOSIS — R079 Chest pain, unspecified: Secondary | ICD-10-CM | POA: Diagnosis not present

## 2018-09-22 MED ORDER — NITROGLYCERIN 0.4 MG SL SUBL: 0.4000 mg | SUBLINGUAL_TABLET | SUBLINGUAL | 3 refills | Status: DC | PRN

## 2018-09-22 NOTE — Patient Instructions (Signed)
Medication Instructions:  - Your physician has recommended you make the following change in your medication:   1) Start nitroglycerin 0.4 mg- dissolve 1 tablet under the tongue every 5 minutes up to 3 doses as directed for chest pain  If you need a refill on your cardiac medications before your next appointment, please call your pharmacy.   Lab work: - none ordered  If you have labs (blood work) drawn today and your tests are completely normal, you will receive your results only by: Marland Kitchen MyChart Message (if you have MyChart) OR . A paper copy in the mail If you have any lab test that is abnormal or we need to change your treatment, we will call you to review the results.  Testing/Procedures: - Your physician has requested that you have a lexiscan myoview.   [place]  Your caregiver has ordered a Stress Test with nuclear imaging. The purpose of this test is to evaluate the blood supply to your heart muscle. This procedure is referred to as a "Non-Invasive Stress Test." This is because other than having an IV started in your vein, nothing is inserted or "invades" your body. Cardiac stress tests are done to find areas of poor blood flow to the heart by determining the extent of coronary artery disease (CAD). Some patients exercise on a treadmill, which naturally increases the blood flow to your heart, while others who are  unable to walk on a treadmill due to physical limitations have a pharmacologic/chemical stress agent called Lexiscan . This medicine will mimic walking on a treadmill by temporarily increasing your coronary blood flow.   Please note: these test may take anywhere between 2-4 hours to complete  PLEASE REPORT TO [facility] AT THE FIRST DESK WILL DIRECT YOU WHERE TO GO  Date of Procedure:_____________________________________  Arrival Time for Procedure:______________________________  Instructions regarding medication:    __x__:  Hold  betablocker(s) night before procedure and morning of procedure (Metoprolol)    PLEASE NOTIFY THE OFFICE AT LEAST 24 HOURS IN ADVANCE IF YOU ARE UNABLE TO KEEP YOUR APPOINTMENT.  [phone] AND  PLEASE NOTIFY NUCLEAR MEDICINE AT [facility] AT LEAST 24 HOURS IN ADVANCE IF YOU ARE UNABLE TO KEEP YOUR APPOINTMENT. [phone]  How to prepare for your Myoview test:  1. Do not eat or drink after midnight 2. No caffeine for 24 hours prior to test 3. No smoking 24 hours prior to test. 4. Your medication may be taken with water.  If your doctor stopped a medication because of this test, do not take that medication. 5. Ladies, please do not wear dresses.  Skirts or pants are appropriate. Please wear a short sleeve shirt. 6. No perfume, cologne or lotion. 7. Wear comfortable walking shoes. No heels!   Follow-Up: At [facility], you and your health needs are our priority.  As part of our continuing mission to provide you with exceptional heart care, we have created designated Provider Care Teams.  These Care Teams include your primary Cardiologist (physician) and Advanced Practice Providers (APPs -  Physician Assistants and Nurse Practitioners) who all work together to provide you with the care you need, when you need it. . in 6 weeks with Dr. Saunders Revel (in office visit)  Any Other Special Instructions Will Be Listed Below (If Applicable). - N/A
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
## 2018-09-23 ENCOUNTER — Telehealth: Payer: Self-pay | Admitting: *Deleted

## 2018-09-23 DIAGNOSIS — Z79899 Other long term (current) drug therapy: Secondary | ICD-10-CM

## 2018-09-23 DIAGNOSIS — R079 Chest pain, unspecified: Secondary | ICD-10-CM

## 2018-09-23 DIAGNOSIS — E782 Mixed hyperlipidemia: Secondary | ICD-10-CM

## 2018-09-23 NOTE — Telephone Encounter (Signed)
Patient notified and verbalized understanding to get lab work to check cholesterol on the same morning of the stress test. She is aware she will need to be fasting.

## 2018-09-23 NOTE — Telephone Encounter (Signed)
-----   Message from Nelva Bush, MD sent at 09/22/2018  5:06 PM EDT ----- Regarding: Lipids and ALT Hi [place] Koller helped me arrange for a lexiscan myoview for Ms. Hissong.  Could you see if we can also check a lipid panel and ALT that day, since she will be fasting?  This will help [place] consolidate her medications based on the results of her stress test and lipid panel.  Thanks.  Gerald Stabs

## 2018-10-03 ENCOUNTER — Other Ambulatory Visit: Payer: Self-pay

## 2018-10-03 ENCOUNTER — Ambulatory Visit
Admission: RE | Admit: 2018-10-03 | Discharge: 2018-10-03 | Disposition: A | Discharge status: Home / Self Care | Payer: Medicare Other | Source: Ambulatory Visit | Attending: Internal Medicine | Admitting: Internal Medicine

## 2018-10-03 DIAGNOSIS — R079 Chest pain, unspecified: Secondary | ICD-10-CM | POA: Diagnosis present

## 2018-10-03 LAB — NM MYOCAR MULTI W/SPECT W/WALL MOTION / EF
Estimated workload: 1 METS
Exercise duration (min): 0 min
Exercise duration (sec): 0 s
LV dias vol: 66 mL (ref 46–106)
LV sys vol: 22 mL
MPHR: 143 {beats}/min
Peak HR: 94 {beats}/min
Percent HR: 65 %
Rest HR: 68 {beats}/min
SDS: 3
SRS: 0
SSS: 2
TID: 1.19

## 2018-10-03 MED ORDER — TECHNETIUM TC 99M TETROFOSMIN IV KIT
30.8400 | PACK | Freq: Once | INTRAVENOUS | Status: AC | PRN
  Administered 2018-10-03: 30.84 via INTRAVENOUS

## 2018-10-03 MED ORDER — TECHNETIUM TC 99M TETROFOSMIN IV KIT
10.1430 | PACK | Freq: Once | INTRAVENOUS | Status: AC | PRN
  Administered 2018-10-03: 10.143 via INTRAVENOUS

## 2018-10-03 MED ORDER — REGADENOSON 0.4 MG/5ML IV SOLN
0.4000 mg | Freq: Once | INTRAVENOUS | Status: AC
  Administered 2018-10-03: 0.4 mg via INTRAVENOUS

## 2018-10-08 ENCOUNTER — Telehealth: Payer: Self-pay

## 2018-10-08 MED ORDER — ISOSORBIDE MONONITRATE ER 30 MG PO TB24: 15.0000 mg | ORAL_TABLET | Freq: Every day | ORAL | 3 refills | Status: DC

## 2018-10-08 NOTE — Telephone Encounter (Signed)
-----   Message from Nelva Bush, MD sent at 10/08/2018  7:13 AM EDT ----- Please let Ms. Rigg know that her stress test does not show evidence of a major blockage, though there may be subtle abnormalities on my review of the images that could account for some of her symptoms.  I recommend that we start isosorbide mononitrate 15 mg daily and follow-up as planned next month.  If her symptoms worsen in the meantime, she should let us know.

## 2018-10-08 NOTE — Telephone Encounter (Signed)
Pt made aware of nyoview results with verbalized understanding. Pt is agreeable with starting Isosorbide 15mg  daily. Rx sent to the pt pharmacy. Adv the pt to f/u with Dr. Saunders Revel as planned, she is to contact the office sooner if symtoms worsen.

## 2018-10-08 NOTE — Telephone Encounter (Signed)
-----   Message from Nelva Bush, MD sent at 10/08/2018  7:13 AM EDT ----- Please let Olivia [name] know that her stress test does not show evidence of a major blockage, though there may be subtle abnormalities on my review of the images that could account for some of her symptoms.  I recommend that we start isosorbide mononitrate 15 mg daily and follow-up as planned next month.  If her symptoms worsen in the meantime, she should let us know.

## 2018-10-08 NOTE — Telephone Encounter (Signed)
Patient calling to discuss recent testing results  ° °Please call  ° °

## 2018-10-08 NOTE — Telephone Encounter (Signed)
Contacted the pt to give myoview results. [email or web address].

## 2018-11-10 ENCOUNTER — Telehealth: Payer: Self-pay | Admitting: *Deleted

## 2018-11-10 NOTE — Telephone Encounter (Signed)

## 2018-11-11 NOTE — Progress Notes (Signed)
Follow-up Outpatient Visit Date: 11/12/2018  Primary Care Provider: Danelle Berry, NP [address] [place] [address]  Chief Complaint: Falls and weakness  HPI:  Olivia [name] is a 78 y.o. year-old female with history of questionable paroxysmal atrial fibrillation, hypertension, renal artery stenosis status post angioplasty x2 to the right renal artery in the 1980s, hyperlipidemia, eczema, and lumbar pain.  We had a virtual visit in late April, at which time Olivia [name] had multiple complaints including palpitations, chest pain, and exertional dyspnea.  Subsequent pharmacologic myocardial perfusion stress test in early May was read as low risk without evidence of ischemia.  EF was 82%.  However, on my review, there is question of a subtle reversible defect involving the anterior wall, which could reflect ischemia versus shifting breast attenuation.  I recommended adding isosorbide mononitrate 15 mg daily to see if that would improve some of her symptoms.  Today, Olivia [name] reports that she continues to have intermittent palpitations, shortness of breath, and chest discomfort, not significantly different than when we last spoke.  She does not believe that isosorbide mononitrate has made much difference.  She has not taken sublingual nitroglycerin, as the chest discomfort, which is difficult to describe, is typically mild and self-limited.  Olivia [name] is most concerned about frequent falls and episodes during which her legs suddenly give out.  There are no warning symptom.  She does not experience palpitations, lightheadedness/dizziness, or pain when these episodes occurs.  She also made a misstep yesterday while trying to get into a car and injured her left shoulder.  She has been evaluated by neurology in the past and is also seeing PM&R for treatment of chronic low back/leg pain and weakness.  Olivia [name] notes that she missed her morning medications a few times recently and actually  felt much better when not taking the medications.  She is very concerned that medication side effects are contributing to her ailments.  She recently saw her dentist and was told that she has extremely try mouth that could be harmful to her teeth.  He could not explain why her dry mouth has worsened recently; Olivia [name] wonders if it could be related to lingering effects from dehydration in the setting of severe gastroenteritis in February.  She has also been under a lot of stress due to family issues.  --------------------------------------------------------------------------------------------------  Cardiovascular History & Procedures: Cardiovascular Problems:  Questionable paroxysmal atrial fibrillation  Right renal artery stenosis status post remote angioplasty  Risk Factors:  Hypertension, hyperlipidemia, and age > 16  Cath/PCI:  None  CV Surgery:  None  EP Procedures and Devices:  None  Non-Invasive Evaluation(s):  Pharmacologic MPI (10/03/2018): Low risk study without ischemia or scar.  LVEF 82%.  On my review, there is a subtle reversible anterior defect that may reflect shifting breast attenuation versus ischemia.  Exercise myocardial perfusion stress test (09/28/15 by Dr. Rosario Jacks): Patient exercised 3 minutes and 0 seconds, achieving 4.6 metastases. No significant arrhythmias or ST segment changes were noted. No significant ischemia or scar was noted. LVEF 86%.  Recent CV Pertinent Labs: Lab Results  Component Value Date   CHOL 256 (H) 12/14/2011   HDL 55.30 12/14/2011   LDLDIRECT 112.3 12/14/2011   TRIG (H) 12/14/2011    465.0 Triglyceride is over 400; calculations on Lipids are invalid.   CHOLHDL 5 12/14/2011   K 3.8 06/29/2018   K 3.9 08/29/2012   MG 2.4 08/12/2006   BUN 26 (H) 06/29/2018   BUN  23 06/20/2015   BUN 16 08/29/2012   CREATININE 1.02 (H) 06/29/2018   CREATININE 0.63 08/29/2012    Past medical and surgical history were reviewed and updated  in EPIC.  Current Meds  Medication Sig  . acetaminophen (TYLENOL) 325 MG tablet Take 650 mg by mouth every 6 (six) hours as needed for mild pain.   . Ascorbic Acid (VITAMIN C) 1000 MG tablet Take 2,000 mg by mouth daily.  Marland Kitchen aspirin 81 MG tablet Take 81 mg by mouth daily.  . baclofen (LIORESAL) 10 MG tablet 10 mg as needed.   . Calcium Carbonate-Vitamin D (CALTRATE 600+D PO) Take 1 tablet by mouth daily.  . Cholecalciferol (D 5000) 125 MCG (5000 UT) capsule Take by mouth daily.   . cloNIDine (CATAPRES) 0.1 MG tablet Take 0.1 mg by mouth daily.   . colestipol (COLESTID) 1 g tablet Take by mouth 2 (two) times a day.  . Cyanocobalamin (VITAMIN B-12) 5000 MCG SUBL Take by mouth. Pt takes every fourth day.  . diphenoxylate-atropine (LOMOTIL) 2.5-0.025 MG tablet 4 (four) times daily as needed.   . DULoxetine (CYMBALTA) 60 MG capsule Take 60 mg by mouth every morning.   . fenofibrate (TRICOR) 48 MG tablet Take by mouth.  . gabapentin (NEURONTIN) 300 MG capsule Take 300 mg by mouth 3 (three) times daily.   . isosorbide mononitrate (IMDUR) 30 MG 24 hr tablet Take 0.5 tablets (15 mg total) by mouth daily.  Marland Kitchen lamoTRIgine (LAMICTAL) 25 MG tablet Take 25 mg by mouth 2 (two) times daily.   Marland Kitchen levothyroxine (SYNTHROID) 25 MCG tablet Take 25 mcg by mouth daily before breakfast.  . losartan (COZAAR) 25 MG tablet Take 25 mg by mouth daily.   . Melatonin 5 MG TABS Take 1 tablet by mouth at bedtime.  . metoprolol tartrate (LOPRESSOR) 50 MG tablet Take 50 mg by mouth 2 (two) times daily.  . Multiple Vitamin (MULTIVITAMIN) tablet Take 1 tablet by mouth daily.    . nitroGLYCERIN (NITROSTAT) 0.4 MG SL tablet Place 1 tablet (0.4 mg total) under the tongue every 5 (five) minutes as needed for chest pain.  Marland Kitchen ondansetron (ZOFRAN ODT) 4 MG disintegrating tablet Take 1 tablet (4 mg total) by mouth every 8 (eight) hours as needed.  . pantoprazole (PROTONIX) 40 MG tablet Take 40 mg by mouth 2 (two) times daily.   .  primidone (MYSOLINE) 50 MG tablet Take 50 mg by mouth 2 (two) times a day.   . Probiotic Product (PROBIOTIC PO) Take by mouth daily.    Allergies: Neomycin, Meperidine hcl, Pravastatin sodium, and Systane [polyethyl glycol-propyl glycol]  Social History   Tobacco Use  . Smoking status: Never Smoker  . Smokeless tobacco: Never Used  Substance Use Topics  . Alcohol use: No    Comment: rarely mixed drink  . Drug use: No    Family History  Problem Relation Age of Onset  . Dementia Father   . Other Father        Multiple Infarcts  . Coronary artery disease Father   . Stroke Father   . Heart disease Father        CAD  . Heart disease Mother        CAD  . Breast cancer Mother   . Coronary artery disease Mother   . Hypertension Mother   . Diabetes Mother   . Cancer Mother        Breast with met  . Heart murmur Brother   .  Heart disease Brother        Heart murmur  . Diabetes Other   . Hyperlipidemia Other        Pacer, enlarged heart  . Cancer Other   . Other Other        Pacer, Enlarged heart, DM  . Kidney disease Neg Hx     Review of Systems: A 12-system review of systems was performed and was negative except as noted in the HPI.  --------------------------------------------------------------------------------------------------  Physical Exam: BP 108/60 (BP Location: Left Arm, Patient Position: Sitting, Cuff Size: Normal)   Pulse 64   Ht 5' 2"  (1.575 m)   Wt 169 lb (76.7 kg)   BMI 30.91 kg/m   General:  NAD HEENT: No conjunctival pallor or scleral icterus. Moist mucous membranes.  OP clear. Neck: Supple without lymphadenopathy, thyromegaly, JVD, or HJR. No carotid bruit. Lungs: Normal work of breathing. Clear to auscultation bilaterally without wheezes or crackles. Heart: Regular rate and rhythm without murmurs, rubs, or gallops. Non-displaced PMI. Abd: Bowel sounds present. Soft, NT/ND without hepatosplenomegaly Ext: No lower extremity edema. Radial, PT,  and DP pulses are 2+ bilaterally. Skin: Warm and dry without rash.  EKG:  Normal sinus rhythm without abnormality.  Lab Results  Component Value Date   WBC 12.1 (H) 06/29/2018   HGB 11.5 (L) 06/29/2018   HCT 36.4 06/29/2018   MCV 86.9 06/29/2018   PLT 542 (H) 06/29/2018    Lab Results  Component Value Date   NA 141 06/29/2018   K 3.8 06/29/2018   CL 104 06/29/2018   CO2 21 (L) 06/29/2018   BUN 26 (H) 06/29/2018   CREATININE 1.02 (H) 06/29/2018   GLUCOSE 173 (H) 06/29/2018   ALT 25 06/29/2018    Lab Results  Component Value Date   CHOL 256 (H) 12/14/2011   HDL 55.30 12/14/2011   LDLDIRECT 112.3 12/14/2011   TRIG (H) 12/14/2011    465.0 Triglyceride is over 400; calculations on Lipids are invalid.   CHOLHDL 5 12/14/2011    --------------------------------------------------------------------------------------------------  ASSESSMENT AND PLAN: Atypical chest pain and shortness of breath: Symptoms are not clearly exertional and have not changed significantly since we last spoke despite addition of low-dose isosorbide mononitrate.  I have personally reviewed the images from her recent stress test, which showed possible subtle reversible anterior defect, though shifting breast attenuation could also account for this.  LVEF was hyperdynamic.  Given that Olivia [name] felt notably better when missing some of her medications, I am concerned that polypharmacy may be driving some of her complaints.  We will try to simplify her medication regimen in a stepwise fashion.  Given weakness/falls and dry mouth, we have agreed to stop clonidine first.  We will defer other medication changes and testing today.  Hypertension: Blood pressure is low-normal.  As above, we will try to minimize polypharmacy and stop clonidine (currently on 0.1 mg QAM).  No other medication changes today.  Palpitations: Stable and intermittent.  Olivia [name] reports having worn multiple monitors in the past that she  states never showed anything abnormal (though she also reports a history of a-fib).  We will readdress need for repeat monitoring when she returns for follow-up.  It does not seem that her recurrent falls with sudden leg weakness are temporally related to palpitations.  Hyperlipidemia: No recent lipid panel in our system; we had planned to obtain one when the patient presented for her stress test, though this was not drawn.  We will try to  arrange for this to be done at her convenience.  She should continue fenofibrate and colestipol for the time being, though she would ideally be on a statin as well given history of renal artery stenosis (she has a history of muscle weakness with pravastatin in the past).  Follow-up: Virtual visit in 3 weeks.  Nelva Bush, MD 11/12/2018 2:29 PM

## 2018-11-12 ENCOUNTER — Encounter: Payer: Self-pay | Admitting: Internal Medicine

## 2018-11-12 ENCOUNTER — Other Ambulatory Visit: Payer: Self-pay

## 2018-11-12 ENCOUNTER — Ambulatory Visit (INDEPENDENT_AMBULATORY_CARE_PROVIDER_SITE_OTHER): Payer: Medicare Other | Admitting: Internal Medicine

## 2018-11-12 VITALS — BP 108/60 | HR 64 | Ht 62.0 in | Wt 169.0 lb

## 2018-11-12 DIAGNOSIS — R0789 Other chest pain: Secondary | ICD-10-CM

## 2018-11-12 DIAGNOSIS — R0602 Shortness of breath: Secondary | ICD-10-CM

## 2018-11-12 DIAGNOSIS — R002 Palpitations: Secondary | ICD-10-CM | POA: Diagnosis not present

## 2018-11-12 DIAGNOSIS — I10 Essential (primary) hypertension: Secondary | ICD-10-CM | POA: Diagnosis not present

## 2018-11-12 DIAGNOSIS — E785 Hyperlipidemia, unspecified: Secondary | ICD-10-CM

## 2018-11-12 NOTE — Patient Instructions (Signed)
Medication Instructions:  Your physician has recommended you make the following change in your medication:  1- STOP Clonidine.   If you need a refill on your cardiac medications before your next appointment, please call your pharmacy.   Lab work: - None ordered.  If you have labs (blood work) drawn today and your tests are completely normal, you will receive your results only by: Marland Kitchen MyChart Message (if you have MyChart) OR . A paper copy in the mail If you have any lab test that is abnormal or we need to change your treatment, we will call you to review the results.  Testing/Procedures: - None ordered.   Follow-Up: At [facility], you and your health needs are our priority.  As part of our continuing mission to provide you with exceptional heart care, we have created designated Provider Care Teams.  These Care Teams include your primary Cardiologist (physician) and Advanced Practice Providers (APPs -  Physician Assistants and Nurse Practitioners) who all work together to provide you with the care you need, when you need it. You will need a follow up appointment in 3 weeks VIRTUAL VISIT WITH DR END OR APP.  Please call our office 2 months in advance to schedule this appointment.  You may see DR Harrell Gave END or one of the following Advanced Practice Providers on your designated Care Team:   Murray Hodgkins, NP Christell Faith, PA-C . Marrianne Mood, PA-C

## 2018-11-13 ENCOUNTER — Telehealth: Payer: Self-pay | Admitting: *Deleted

## 2018-11-13 ENCOUNTER — Encounter: Payer: Self-pay | Admitting: Internal Medicine

## 2018-11-13 NOTE — Telephone Encounter (Signed)
[facility] notified and verbalized [facility] and to be fasting after midnight - nothing to eat or drink except water or black coffee.  Orders are already in. She has hair appointment in the morning early and will come here right after that.

## 2018-11-13 NOTE — Telephone Encounter (Signed)
-----   Message from Nelva Bush, MD sent at 11/13/2018  7:49 AM EDT ----- Regarding: Lipid panel Top of the morning,  Hope you are doing well.  I realized this morning that we had asked Ms. Coviello to have a fasting lipid panel and ALT done when she came in for her stress test, though it looks like she did not have any blood drawn at that time.  Could you see if she can have that done at her convenience?  Thanks and sorry for the trouble.  Gerald Stabs

## 2018-11-14 ENCOUNTER — Other Ambulatory Visit
Admission: RE | Admit: 2018-11-14 | Discharge: 2018-11-14 | Disposition: A | Discharge status: Home / Self Care | Payer: Medicare Other | Attending: Internal Medicine | Admitting: Internal Medicine

## 2018-11-14 ENCOUNTER — Other Ambulatory Visit: Payer: Self-pay

## 2018-11-14 DIAGNOSIS — R079 Chest pain, unspecified: Secondary | ICD-10-CM

## 2018-11-14 DIAGNOSIS — Z79899 Other long term (current) drug therapy: Secondary | ICD-10-CM

## 2018-11-14 DIAGNOSIS — E782 Mixed hyperlipidemia: Secondary | ICD-10-CM | POA: Diagnosis present

## 2018-11-14 LAB — LIPID PANEL
Cholesterol: 158 mg/dL (ref 0–200)
HDL: 69 mg/dL (ref >40)
LDL Cholesterol: 72 mg/dL (ref 0–99)
Total CHOL/HDL Ratio: 2.3 RATIO
Triglycerides: 87 mg/dL (ref <150)
VLDL: 17 mg/dL (ref 0–40)

## 2018-11-14 LAB — ALT: ALT: 14 U/L (ref 0–44)

## 2018-11-28 ENCOUNTER — Ambulatory Visit (INDEPENDENT_AMBULATORY_CARE_PROVIDER_SITE_OTHER): Payer: Medicare Other | Admitting: Psychology

## 2018-11-28 DIAGNOSIS — F330 Major depressive disorder, recurrent, mild: Secondary | ICD-10-CM

## 2018-11-29 ENCOUNTER — Ambulatory Visit: Payer: Medicare Other | Admitting: Psychology
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
## 2018-12-03 ENCOUNTER — Encounter: Payer: Self-pay | Admitting: Physician Assistant

## 2018-12-03 ENCOUNTER — Telehealth (INDEPENDENT_AMBULATORY_CARE_PROVIDER_SITE_OTHER): Payer: Medicare Other | Admitting: Physician Assistant

## 2018-12-03 ENCOUNTER — Other Ambulatory Visit: Payer: Self-pay

## 2018-12-03 ENCOUNTER — Emergency Department: Payer: Medicare Other

## 2018-12-03 ENCOUNTER — Encounter: Payer: Self-pay | Admitting: *Deleted

## 2018-12-03 ENCOUNTER — Inpatient Hospital Stay
Admission: EM | Admit: 2018-12-03 | Discharge: 2018-12-04 | DRG: 287 | Disposition: A | Discharge status: Home / Self Care | Payer: Medicare Other | Attending: Internal Medicine | Admitting: Internal Medicine

## 2018-12-03 VITALS — BP 113/68 | HR 74 | Ht 62.0 in | Wt 168.0 lb

## 2018-12-03 DIAGNOSIS — Z1159 Encounter for screening for other viral diseases: Secondary | ICD-10-CM | POA: Diagnosis not present

## 2018-12-03 DIAGNOSIS — Z9049 Acquired absence of other specified parts of digestive tract: Secondary | ICD-10-CM | POA: Diagnosis not present

## 2018-12-03 DIAGNOSIS — I129 Hypertensive chronic kidney disease with stage 1 through stage 4 chronic kidney disease, or unspecified chronic kidney disease: Secondary | ICD-10-CM | POA: Diagnosis not present

## 2018-12-03 DIAGNOSIS — E039 Hypothyroidism, unspecified: Secondary | ICD-10-CM | POA: Diagnosis not present

## 2018-12-03 DIAGNOSIS — M069 Rheumatoid arthritis, unspecified: Secondary | ICD-10-CM | POA: Diagnosis present

## 2018-12-03 DIAGNOSIS — Z90711 Acquired absence of uterus with remaining cervical stump: Secondary | ICD-10-CM

## 2018-12-03 DIAGNOSIS — G8929 Other chronic pain: Secondary | ICD-10-CM | POA: Diagnosis present

## 2018-12-03 DIAGNOSIS — Z9013 Acquired absence of bilateral breasts and nipples: Secondary | ICD-10-CM | POA: Diagnosis not present

## 2018-12-03 DIAGNOSIS — I480 Paroxysmal atrial fibrillation: Secondary | ICD-10-CM | POA: Diagnosis present

## 2018-12-03 DIAGNOSIS — E782 Mixed hyperlipidemia: Secondary | ICD-10-CM | POA: Diagnosis not present

## 2018-12-03 DIAGNOSIS — M797 Fibromyalgia: Secondary | ICD-10-CM | POA: Diagnosis not present

## 2018-12-03 DIAGNOSIS — Z888 Allergy status to other drugs, medicaments and biological substances status: Secondary | ICD-10-CM | POA: Diagnosis not present

## 2018-12-03 DIAGNOSIS — Z86718 Personal history of other venous thrombosis and embolism: Secondary | ICD-10-CM | POA: Diagnosis not present

## 2018-12-03 DIAGNOSIS — Z833 Family history of diabetes mellitus: Secondary | ICD-10-CM

## 2018-12-03 DIAGNOSIS — R002 Palpitations: Secondary | ICD-10-CM | POA: Diagnosis not present

## 2018-12-03 DIAGNOSIS — I200 Unstable angina: Secondary | ICD-10-CM

## 2018-12-03 DIAGNOSIS — Z853 Personal history of malignant neoplasm of breast: Secondary | ICD-10-CM

## 2018-12-03 DIAGNOSIS — Z7982 Long term (current) use of aspirin: Secondary | ICD-10-CM | POA: Diagnosis not present

## 2018-12-03 DIAGNOSIS — M5090 Cervical disc disorder, unspecified, unspecified cervical region: Secondary | ICD-10-CM | POA: Diagnosis present

## 2018-12-03 DIAGNOSIS — K210 Gastro-esophageal reflux disease with esophagitis: Secondary | ICD-10-CM | POA: Diagnosis present

## 2018-12-03 DIAGNOSIS — Z803 Family history of malignant neoplasm of breast: Secondary | ICD-10-CM | POA: Diagnosis not present

## 2018-12-03 DIAGNOSIS — E785 Hyperlipidemia, unspecified: Secondary | ICD-10-CM

## 2018-12-03 DIAGNOSIS — R0789 Other chest pain: Secondary | ICD-10-CM | POA: Diagnosis present

## 2018-12-03 DIAGNOSIS — I10 Essential (primary) hypertension: Secondary | ICD-10-CM

## 2018-12-03 DIAGNOSIS — Z8711 Personal history of peptic ulcer disease: Secondary | ICD-10-CM | POA: Diagnosis not present

## 2018-12-03 DIAGNOSIS — Z79890 Hormone replacement therapy (postmenopausal): Secondary | ICD-10-CM

## 2018-12-03 DIAGNOSIS — F319 Bipolar disorder, unspecified: Secondary | ICD-10-CM | POA: Diagnosis present

## 2018-12-03 DIAGNOSIS — Z8249 Family history of ischemic heart disease and other diseases of the circulatory system: Secondary | ICD-10-CM | POA: Diagnosis not present

## 2018-12-03 DIAGNOSIS — Z823 Family history of stroke: Secondary | ICD-10-CM

## 2018-12-03 DIAGNOSIS — Z79899 Other long term (current) drug therapy: Secondary | ICD-10-CM

## 2018-12-03 DIAGNOSIS — F419 Anxiety disorder, unspecified: Secondary | ICD-10-CM | POA: Diagnosis present

## 2018-12-03 DIAGNOSIS — Z87442 Personal history of urinary calculi: Secondary | ICD-10-CM

## 2018-12-03 DIAGNOSIS — J45909 Unspecified asthma, uncomplicated: Secondary | ICD-10-CM | POA: Diagnosis present

## 2018-12-03 DIAGNOSIS — N189 Chronic kidney disease, unspecified: Secondary | ICD-10-CM | POA: Diagnosis present

## 2018-12-03 DIAGNOSIS — R079 Chest pain, unspecified: Secondary | ICD-10-CM | POA: Diagnosis present

## 2018-12-03 DIAGNOSIS — M47814 Spondylosis without myelopathy or radiculopathy, thoracic region: Secondary | ICD-10-CM | POA: Diagnosis present

## 2018-12-03 DIAGNOSIS — K219 Gastro-esophageal reflux disease without esophagitis: Secondary | ICD-10-CM | POA: Diagnosis present

## 2018-12-03 DIAGNOSIS — Z9862 Peripheral vascular angioplasty status: Secondary | ICD-10-CM

## 2018-12-03 DIAGNOSIS — Z8541 Personal history of malignant neoplasm of cervix uteri: Secondary | ICD-10-CM

## 2018-12-03 LAB — CBC
HCT: 32.7 % — ABNORMAL LOW (ref 36.0–46.0)
Hemoglobin: 10 g/dL — ABNORMAL LOW (ref 12.0–15.0)
MCH: 25.5 pg — ABNORMAL LOW (ref 26.0–34.0)
MCHC: 30.6 g/dL (ref 30.0–36.0)
MCV: 83.4 fL (ref 80.0–100.0)
Platelets: 456 10*3/uL — ABNORMAL HIGH (ref 150–400)
RBC: 3.92 MIL/uL (ref 3.87–5.11)
RDW: 14.2 % (ref 11.5–15.5)
WBC: 7.4 10*3/uL (ref 4.0–10.5)
nRBC: 0 % (ref 0.0–0.2)

## 2018-12-03 LAB — BASIC METABOLIC PANEL
Anion gap: 11 (ref 5–15)
BUN: 29 mg/dL — ABNORMAL HIGH (ref 8–23)
CO2: 24 mmol/L (ref 22–32)
Calcium: 9.2 mg/dL (ref 8.9–10.3)
Chloride: 104 mmol/L (ref 98–111)
Creatinine, Ser: 0.99 mg/dL (ref 0.44–1.00)
GFR calc Af Amer: >60 mL/min (ref >60)
GFR calc non Af Amer: 55 mL/min — ABNORMAL LOW (ref >60)
Glucose, Bld: 98 mg/dL (ref 70–99)
Potassium: 4.8 mmol/L (ref 3.5–5.1)
Sodium: 139 mmol/L (ref 135–145)

## 2018-12-03 LAB — TROPONIN I (HIGH SENSITIVITY)
Troponin I (High Sensitivity): 3 ng/L (ref <18)
Troponin I (High Sensitivity): 3 ng/L (ref <18)

## 2018-12-03 MED ORDER — SODIUM CHLORIDE 0.9% FLUSH
3.0000 mL | Freq: Once | INTRAVENOUS | Status: AC
  Administered 2018-12-04: 3 mL via INTRAVENOUS

## 2018-12-03 MED ORDER — IOHEXOL 350 MG/ML SOLN
75.0000 mL | Freq: Once | INTRAVENOUS | Status: AC | PRN
  Administered 2018-12-03: 75 mL via INTRAVENOUS

## 2018-12-03 NOTE — ED Triage Notes (Addendum)
Pt to ED for chest pain since Sunday. Dr. Marisue Humble told patient to come to ED for admission referred pt to ED to r/o MI and a cath to be performed in the morning.   Pain woke pt on Sunday. Pt took 2 Nitro pills that decreased pain but pt did not come to ED then.

## 2018-12-03 NOTE — Patient Instructions (Signed)
It was a pleasure to speak with you on the phone today! Thank you for allowing [place] to continue taking care of your [facility] needs during this time.   Feel free to call as needed for questions and concerns related to your cardiac needs.   Medication Instructions:  Your physician recommends that you continue on your current medications as directed. Please refer to the Current Medication list given to you today. If you need a refill on your cardiac medications before your next appointment, please call your pharmacy.   Lab work: None ordered  If you have labs (blood work) drawn today and your tests are completely normal, you will receive your results only by: Marland Kitchen MyChart Message (if you have MyChart) OR . A paper copy in the mail If you have any lab test that is abnormal or we need to change your treatment, we will call you to review the results.  Testing/Procedures: None ordered   Follow-Up: At [facility], you and your health needs are our priority.  As part of our continuing mission to provide you with exceptional heart care, we have created designated Provider Care Teams.  These Care Teams include your primary Cardiologist (physician) and Advanced Practice Providers (APPs -  Physician Assistants and Nurse Practitioners) who all work together to provide you with the care you need, when you need it. You will need a follow up appointment in per ED recommendation.   Any Other Special Instructions Will Be Listed Below (If Applicable). Report to ED for further workup per Christell Faith, PA recommendation.
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
## 2018-12-04 ENCOUNTER — Other Ambulatory Visit: Payer: Self-pay

## 2018-12-04 ENCOUNTER — Encounter
Admission: EM | Disposition: A | Discharge status: Home / Self Care | Payer: Self-pay | Source: Home / Self Care | Attending: Internal Medicine

## 2018-12-04 ENCOUNTER — Observation Stay (HOSPITAL_BASED_OUTPATIENT_CLINIC_OR_DEPARTMENT_OTHER)
Admit: 2018-12-04 | Discharge: 2018-12-04 | Disposition: A | Discharge status: Home / Self Care | Payer: Medicare Other | Attending: Internal Medicine | Admitting: Internal Medicine

## 2018-12-04 DIAGNOSIS — K210 Gastro-esophageal reflux disease with esophagitis: Secondary | ICD-10-CM | POA: Diagnosis not present

## 2018-12-04 DIAGNOSIS — E039 Hypothyroidism, unspecified: Secondary | ICD-10-CM | POA: Diagnosis not present

## 2018-12-04 DIAGNOSIS — Z86718 Personal history of other venous thrombosis and embolism: Secondary | ICD-10-CM | POA: Diagnosis not present

## 2018-12-04 DIAGNOSIS — Z9013 Acquired absence of bilateral breasts and nipples: Secondary | ICD-10-CM | POA: Diagnosis not present

## 2018-12-04 DIAGNOSIS — K219 Gastro-esophageal reflux disease without esophagitis: Secondary | ICD-10-CM | POA: Diagnosis present

## 2018-12-04 DIAGNOSIS — R0789 Other chest pain: Secondary | ICD-10-CM | POA: Diagnosis present

## 2018-12-04 DIAGNOSIS — Z1159 Encounter for screening for other viral diseases: Secondary | ICD-10-CM | POA: Diagnosis not present

## 2018-12-04 DIAGNOSIS — Z833 Family history of diabetes mellitus: Secondary | ICD-10-CM | POA: Diagnosis not present

## 2018-12-04 DIAGNOSIS — J45909 Unspecified asthma, uncomplicated: Secondary | ICD-10-CM | POA: Diagnosis not present

## 2018-12-04 DIAGNOSIS — R079 Chest pain, unspecified: Secondary | ICD-10-CM

## 2018-12-04 DIAGNOSIS — Z8541 Personal history of malignant neoplasm of cervix uteri: Secondary | ICD-10-CM | POA: Diagnosis not present

## 2018-12-04 DIAGNOSIS — Z8249 Family history of ischemic heart disease and other diseases of the circulatory system: Secondary | ICD-10-CM | POA: Diagnosis not present

## 2018-12-04 DIAGNOSIS — I10 Essential (primary) hypertension: Secondary | ICD-10-CM

## 2018-12-04 DIAGNOSIS — I200 Unstable angina: Secondary | ICD-10-CM | POA: Diagnosis not present

## 2018-12-04 DIAGNOSIS — Z90711 Acquired absence of uterus with remaining cervical stump: Secondary | ICD-10-CM | POA: Diagnosis not present

## 2018-12-04 DIAGNOSIS — F419 Anxiety disorder, unspecified: Secondary | ICD-10-CM | POA: Diagnosis not present

## 2018-12-04 DIAGNOSIS — I480 Paroxysmal atrial fibrillation: Secondary | ICD-10-CM | POA: Diagnosis not present

## 2018-12-04 DIAGNOSIS — M797 Fibromyalgia: Secondary | ICD-10-CM | POA: Diagnosis not present

## 2018-12-04 DIAGNOSIS — E782 Mixed hyperlipidemia: Secondary | ICD-10-CM | POA: Diagnosis not present

## 2018-12-04 DIAGNOSIS — Z7982 Long term (current) use of aspirin: Secondary | ICD-10-CM | POA: Diagnosis not present

## 2018-12-04 DIAGNOSIS — Z87442 Personal history of urinary calculi: Secondary | ICD-10-CM | POA: Diagnosis not present

## 2018-12-04 DIAGNOSIS — Z79899 Other long term (current) drug therapy: Secondary | ICD-10-CM | POA: Diagnosis not present

## 2018-12-04 DIAGNOSIS — I208 Other forms of angina pectoris: Secondary | ICD-10-CM | POA: Diagnosis not present

## 2018-12-04 DIAGNOSIS — Z8711 Personal history of peptic ulcer disease: Secondary | ICD-10-CM | POA: Diagnosis not present

## 2018-12-04 DIAGNOSIS — Z803 Family history of malignant neoplasm of breast: Secondary | ICD-10-CM | POA: Diagnosis not present

## 2018-12-04 DIAGNOSIS — I129 Hypertensive chronic kidney disease with stage 1 through stage 4 chronic kidney disease, or unspecified chronic kidney disease: Secondary | ICD-10-CM | POA: Diagnosis not present

## 2018-12-04 DIAGNOSIS — Z823 Family history of stroke: Secondary | ICD-10-CM | POA: Diagnosis not present

## 2018-12-04 DIAGNOSIS — Z888 Allergy status to other drugs, medicaments and biological substances status: Secondary | ICD-10-CM | POA: Diagnosis not present

## 2018-12-04 DIAGNOSIS — Z853 Personal history of malignant neoplasm of breast: Secondary | ICD-10-CM | POA: Diagnosis not present

## 2018-12-04 DIAGNOSIS — Z9049 Acquired absence of other specified parts of digestive tract: Secondary | ICD-10-CM | POA: Diagnosis not present

## 2018-12-04 HISTORY — PX: LEFT HEART CATH AND CORONARY ANGIOGRAPHY: CATH118249

## 2018-12-04 LAB — HEPATIC FUNCTION PANEL
ALT: 14 U/L (ref 0–44)
AST: 23 U/L (ref 15–41)
Albumin: 4.2 g/dL (ref 3.5–5.0)
Alkaline Phosphatase: 40 U/L (ref 38–126)
Bilirubin, Direct: <0.1 mg/dL (ref 0.0–0.2)
Total Bilirubin: 0.4 mg/dL (ref 0.3–1.2)
Total Protein: 7 g/dL (ref 6.5–8.1)

## 2018-12-04 LAB — PROTIME-INR
INR: 1 (ref 0.8–1.2)
Prothrombin Time: 13.3 seconds (ref 11.4–15.2)

## 2018-12-04 LAB — SARS CORONAVIRUS 2 BY RT PCR (HOSPITAL ORDER, PERFORMED IN ~~LOC~~ HOSPITAL LAB): SARS Coronavirus 2: NEGATIVE

## 2018-12-04 LAB — CBC
HCT: 33.5 % — ABNORMAL LOW (ref 36.0–46.0)
Hemoglobin: 10.2 g/dL — ABNORMAL LOW (ref 12.0–15.0)
MCH: 25.3 pg — ABNORMAL LOW (ref 26.0–34.0)
MCHC: 30.4 g/dL (ref 30.0–36.0)
MCV: 83.1 fL (ref 80.0–100.0)
Platelets: 418 10*3/uL — ABNORMAL HIGH (ref 150–400)
RBC: 4.03 MIL/uL (ref 3.87–5.11)
RDW: 14.2 % (ref 11.5–15.5)
WBC: 6.3 10*3/uL (ref 4.0–10.5)
nRBC: 0 % (ref 0.0–0.2)

## 2018-12-04 LAB — ECHOCARDIOGRAM COMPLETE
Height: 62 in
Weight: 2208 oz

## 2018-12-04 LAB — GLUCOSE, CAPILLARY: Glucose-Capillary: 84 mg/dL (ref 70–99)

## 2018-12-04 LAB — BASIC METABOLIC PANEL
Anion gap: 10 (ref 5–15)
BUN: 24 mg/dL — ABNORMAL HIGH (ref 8–23)
CO2: 26 mmol/L (ref 22–32)
Calcium: 9 mg/dL (ref 8.9–10.3)
Chloride: 104 mmol/L (ref 98–111)
Creatinine, Ser: 0.86 mg/dL (ref 0.44–1.00)
GFR calc Af Amer: >60 mL/min (ref >60)
GFR calc non Af Amer: >60 mL/min (ref >60)
Glucose, Bld: 93 mg/dL (ref 70–99)
Potassium: 4.1 mmol/L (ref 3.5–5.1)
Sodium: 140 mmol/L (ref 135–145)

## 2018-12-04 LAB — APTT: aPTT: 41 seconds — ABNORMAL HIGH (ref 24–36)

## 2018-12-04 LAB — TSH: TSH: 3.313 u[IU]/mL (ref 0.350–4.500)

## 2018-12-04 LAB — LIPASE, BLOOD: Lipase: 44 U/L (ref 11–51)

## 2018-12-04 SURGERY — LEFT HEART CATH AND CORONARY ANGIOGRAPHY
Anesthesia: Moderate Sedation

## 2018-12-04 MED ORDER — SODIUM CHLORIDE 0.9 % WEIGHT BASED INFUSION: 1.0000 mL/kg/h | INTRAVENOUS | Status: DC

## 2018-12-04 MED ORDER — SODIUM CHLORIDE 0.9 % WEIGHT BASED INFUSION
1.0000 mL/kg/h | INTRAVENOUS | Status: DC
  Administered 2018-12-04: 1 mL/kg/h via INTRAVENOUS

## 2018-12-04 MED ORDER — ONDANSETRON HCL 4 MG/2ML IJ SOLN: 4.0000 mg | Freq: Four times a day (QID) | INTRAMUSCULAR | Status: DC | PRN

## 2018-12-04 MED ORDER — SODIUM CHLORIDE 0.9% FLUSH
3.0000 mL | Freq: Two times a day (BID) | INTRAVENOUS | Status: DC
  Administered 2018-12-04: 3 mL via INTRAVENOUS

## 2018-12-04 MED ORDER — SODIUM CHLORIDE 0.9% FLUSH: 3.0000 mL | INTRAVENOUS | Status: DC | PRN

## 2018-12-04 MED ORDER — SODIUM CHLORIDE 0.9 % IV SOLN: 250.0000 mL | INTRAVENOUS | Status: DC | PRN

## 2018-12-04 MED ORDER — METOPROLOL TARTRATE 50 MG PO TABS: 50.0000 mg | ORAL_TABLET | Freq: Two times a day (BID) | ORAL | Status: DC

## 2018-12-04 MED ORDER — HYDRALAZINE HCL 20 MG/ML IJ SOLN: 10.0000 mg | INTRAMUSCULAR | Status: DC | PRN

## 2018-12-04 MED ORDER — LEVOTHYROXINE SODIUM 25 MCG PO TABS
25.0000 ug | ORAL_TABLET | Freq: Every day | ORAL | Status: DC
  Administered 2018-12-04: 25 ug via ORAL
  Filled 2018-12-04: qty 1

## 2018-12-04 MED ORDER — DULOXETINE HCL 30 MG PO CPEP
60.0000 mg | ORAL_CAPSULE | Freq: Every morning | ORAL | Status: DC
  Administered 2018-12-04: 60 mg via ORAL
  Filled 2018-12-04: qty 2

## 2018-12-04 MED ORDER — LOSARTAN POTASSIUM 25 MG PO TABS: 25.0000 mg | ORAL_TABLET | Freq: Every day | ORAL | Status: DC

## 2018-12-04 MED ORDER — SODIUM CHLORIDE 0.9% FLUSH: 3.0000 mL | Freq: Two times a day (BID) | INTRAVENOUS | Status: DC

## 2018-12-04 MED ORDER — ENOXAPARIN SODIUM 40 MG/0.4ML ~~LOC~~ SOLN
40.0000 mg | SUBCUTANEOUS | Status: DC
  Administered 2018-12-04: 40 mg via SUBCUTANEOUS
  Filled 2018-12-04: qty 0.4

## 2018-12-04 MED ORDER — ASPIRIN 81 MG PO CHEW: 81.0000 mg | CHEWABLE_TABLET | ORAL | Status: DC

## 2018-12-04 MED ORDER — SODIUM CHLORIDE 0.9 % WEIGHT BASED INFUSION
3.0000 mL/kg/h | INTRAVENOUS | Status: DC
  Administered 2018-12-04: 3 mL/kg/h via INTRAVENOUS

## 2018-12-04 MED ORDER — ISOSORBIDE MONONITRATE ER 30 MG PO TB24
15.0000 mg | ORAL_TABLET | Freq: Every day | ORAL | Status: DC
  Administered 2018-12-04: 15 mg via ORAL
  Filled 2018-12-04: qty 1

## 2018-12-04 MED ORDER — PROPRANOLOL HCL 10 MG PO TABS
10.0000 mg | ORAL_TABLET | Freq: Two times a day (BID) | ORAL | Status: DC
  Administered 2018-12-04: 10 mg via ORAL
  Filled 2018-12-04 (×2): qty 1

## 2018-12-04 MED ORDER — MIDAZOLAM HCL 2 MG/2ML IJ SOLN
INTRAMUSCULAR | Status: DC | PRN
  Administered 2018-12-04: 1 mg via INTRAVENOUS
  Administered 2018-12-04: 0.5 mg via INTRAVENOUS

## 2018-12-04 MED ORDER — FENOFIBRATE 160 MG PO TABS
160.0000 mg | ORAL_TABLET | Freq: Every day | ORAL | Status: DC
  Administered 2018-12-04: 160 mg via ORAL
  Filled 2018-12-04: qty 1

## 2018-12-04 MED ORDER — HEPARIN (PORCINE) IN NACL 1000-0.9 UT/500ML-% IV SOLN
INTRAVENOUS | Status: AC
  Filled 2018-12-04: qty 1000

## 2018-12-04 MED ORDER — ACETAMINOPHEN 325 MG PO TABS
650.0000 mg | ORAL_TABLET | Freq: Four times a day (QID) | ORAL | Status: DC | PRN
  Administered 2018-12-04: 650 mg via ORAL
  Filled 2018-12-04: qty 2

## 2018-12-04 MED ORDER — HEPARIN (PORCINE) IN NACL 1000-0.9 UT/500ML-% IV SOLN
INTRAVENOUS | Status: DC | PRN
  Administered 2018-12-04: 500 mL

## 2018-12-04 MED ORDER — NITROGLYCERIN 0.4 MG SL SUBL: 0.4000 mg | SUBLINGUAL_TABLET | SUBLINGUAL | Status: DC | PRN

## 2018-12-04 MED ORDER — BACLOFEN 10 MG PO TABS
5.0000 mg | ORAL_TABLET | Freq: Two times a day (BID) | ORAL | Status: DC
  Administered 2018-12-04: 5 mg via ORAL
  Filled 2018-12-04 (×2): qty 0.5

## 2018-12-04 MED ORDER — ACETAMINOPHEN 325 MG PO TABS: 650.0000 mg | ORAL_TABLET | ORAL | Status: DC | PRN

## 2018-12-04 MED ORDER — MIDAZOLAM HCL 2 MG/2ML IJ SOLN
INTRAMUSCULAR | Status: AC
  Filled 2018-12-04: qty 2

## 2018-12-04 MED ORDER — ACETAMINOPHEN 650 MG RE SUPP: 650.0000 mg | Freq: Four times a day (QID) | RECTAL | Status: DC | PRN

## 2018-12-04 MED ORDER — COLESTIPOL HCL 1 G PO TABS
1.0000 g | ORAL_TABLET | Freq: Two times a day (BID) | ORAL | Status: DC
  Filled 2018-12-04 (×2): qty 1

## 2018-12-04 MED ORDER — LABETALOL HCL 5 MG/ML IV SOLN: 10.0000 mg | INTRAVENOUS | Status: DC | PRN

## 2018-12-04 MED ORDER — IOHEXOL 300 MG/ML  SOLN
INTRAMUSCULAR | Status: DC | PRN
  Administered 2018-12-04: 120 mL via INTRA_ARTERIAL

## 2018-12-04 MED ORDER — GABAPENTIN 300 MG PO CAPS
300.0000 mg | ORAL_CAPSULE | Freq: Three times a day (TID) | ORAL | Status: DC
  Administered 2018-12-04: 300 mg via ORAL
  Filled 2018-12-04: qty 1

## 2018-12-04 MED ORDER — LAMOTRIGINE 100 MG PO TABS: 50.0000 mg | ORAL_TABLET | Freq: Every day | ORAL | Status: DC

## 2018-12-04 MED ORDER — ASPIRIN EC 81 MG PO TBEC
81.0000 mg | DELAYED_RELEASE_TABLET | Freq: Every day | ORAL | Status: DC
  Administered 2018-12-04: 10:00:00 81 mg via ORAL
  Filled 2018-12-04: qty 1

## 2018-12-04 MED ORDER — FENTANYL CITRATE (PF) 100 MCG/2ML IJ SOLN
INTRAMUSCULAR | Status: AC
  Filled 2018-12-04: qty 2

## 2018-12-04 MED ORDER — ONDANSETRON HCL 4 MG PO TABS: 4.0000 mg | ORAL_TABLET | Freq: Four times a day (QID) | ORAL | Status: DC | PRN

## 2018-12-04 MED ORDER — PANTOPRAZOLE SODIUM 40 MG PO TBEC
40.0000 mg | DELAYED_RELEASE_TABLET | Freq: Every day | ORAL | Status: DC
  Administered 2018-12-04: 40 mg via ORAL
  Filled 2018-12-04: qty 1

## 2018-12-04 MED ORDER — FENTANYL CITRATE (PF) 100 MCG/2ML IJ SOLN
INTRAMUSCULAR | Status: DC | PRN
  Administered 2018-12-04 (×2): 25 ug via INTRAVENOUS

## 2018-12-04 MED ORDER — FENOFIBRATE 145 MG PO TABS: 145.0000 mg | ORAL_TABLET | Freq: Every day | ORAL | Status: DC

## 2018-12-04 SURGICAL SUPPLY — 12 items
CATH INFINITI 5 FR 3DRC (CATHETERS) ×2 IMPLANT
CATH INFINITI 5FR AL1 (CATHETERS) ×2 IMPLANT
CATH INFINITI 5FR ANG PIGTAIL (CATHETERS) ×2 IMPLANT
CATH INFINITI 5FR JL4 (CATHETERS) ×2 IMPLANT
CATH INFINITI JR4 5F (CATHETERS) ×2 IMPLANT
DEVICE CLOSURE MYNXGRIP 5F (Vascular Products) ×2 IMPLANT
KIT MANI 3VAL PERCEP (MISCELLANEOUS) ×3 IMPLANT
NDL PERC 18GX7CM (NEEDLE) IMPLANT
NEEDLE PERC 18GX7CM (NEEDLE) ×3 IMPLANT
PACK CARDIAC CATH (CUSTOM PROCEDURE TRAY) ×3 IMPLANT
SHEATH AVANTI 5FR X 11CM (SHEATH) ×2 IMPLANT
WIRE GUIDERIGHT .035X150 (WIRE) ×2 IMPLANT

## 2018-12-04 NOTE — Plan of Care (Signed)
  Problem: Clinical Measurements: Goal: Ability to maintain clinical measurements within normal limits will improve Outcome: Progressing Goal: Will remain free from infection Outcome: Progressing Goal: Cardiovascular complication will be avoided Outcome: Progressing   Problem: Coping: Goal: Level of anxiety will decrease Outcome: Progressing   Problem: Pain Managment: Goal: General experience of comfort will improve Outcome: Progressing   Problem: Safety: Goal: Ability to remain free from injury will improve Outcome: Progressing

## 2018-12-04 NOTE — Progress Notes (Signed)
*  PRELIMINARY RESULTS* Echocardiogram 2D Echocardiogram has been performed.  Olivia [name] 12/04/2018, 10:09 AM
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
## 2018-12-04 NOTE — Progress Notes (Signed)
Patient ID: Olivia [name], female   DOB: 1941-05-15, 78 y.o.   MRN: [id number] patient seen and examined. Came in with some chest discomfort. So far has ruled out for acute MI. Patient is on schedule for cardiac cath either today or tomorrow by Dr. Rockey Situ.  Follow further recommendations by cardiology. Patient otherwise hemodynamically stable. All questions answered.

## 2018-12-04 NOTE — Discharge Summary (Signed)
[facility] [place] NAME: Olivia [name]    MR#:  [id number]  DATE OF BIRTH:  01/18/1941  DATE OF ADMISSION:  12/03/2018 ADMITTING PHYSICIAN: Lance Coon, MD  DATE OF DISCHARGE: 12/04/2018  PRIMARY CARE PHYSICIAN: Danelle Berry, NP    ADMISSION DIAGNOSIS:  referred by provider  DISCHARGE DIAGNOSIS:  Chest pain--non cardiac  SECONDARY DIAGNOSIS:   Past Medical History:  Diagnosis Date  . Agitation   . Allergic    Immunotherapy  . Allergic rhinitis   . ALLERGIC RHINITIS 10/26/2006   Qualifier: Diagnosis of  By: Council Mechanic MD, Hilaria Ota   . Anemia, iron deficiency   . Asthma   . Barrett's esophagus 01/04/2011  . Bipolar affective disorder ([place])   . BIPOLAR AFFECTIVE DISORDER, DEPRESSED, HX OF 10/25/2006   Qualifier: Diagnosis of  By: Zara Council LPN, Mariann Laster    . Blood in stool    Via stool cards  . Blood transfusion without reported diagnosis   . Breast cancer ([place]) 1973   Bilateral Mastectomies  . Carpal tunnel syndrome   . Cervical cancer [facility])    Hysterectomy  . Chest pain   . Chronic kidney disease   . 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 Constipation    Mild  . Depressed   . Diarrhea   . DVT (deep venous thrombosis) ([place])   . Fibromyalgia   . Gastric ulcer    Acute  . Gastritis    Barretts, [place]  . GERD (gastroesophageal reflux disease)   . Gross hematuria 06/23/2015  . History of kidney stones   . Hyperlipidemia   . Hypertension   . HYPERTENSION 10/26/2006   Qualifier: Diagnosis of  By: Council Mechanic MD, Hilaria Ota   . Hypothyroidism   . 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 Ophthalmoplegia    Dr. Nanine Means at [facility]  . Osteoarthritis    Dr. Marveen Reeks Dr. Jefm Bryant  . Osteoarthrosis    Dr. Marveen Reeks  . PAF (paroxysmal atrial fibrillation) ([place])   . Pain 01/2010   Declined by [facility] pain clinic  . Pneumonia    with trach / 2 YOA and 14 YOA  . PONV (postoperative nausea and  vomiting)    past hx.-none recent  . Renal artery stenosis ([place]) 01/04/2011   S/p angioplasty of vessels x2   . Rheumatoid arthritis(714.0)    Dr. Jefm Bryant  . Sinusitis, acute 07/09/2012    HOSPITAL COURSE:  Olivia [name]  is a 78 y.o. female who presents with chief complaint  to the ED with a complaint of chest pain.  She had a virtual visit with her cardiologist today and was told to come to the emergency department given concern over the symptoms she is describing chest pain.  *CHEST PAIN -two high sensitive troponin values in the ED were negative.  -no acute EKG changes -Cardiac cath per DR Gollan--unremarkable. Cont present meds  *  Hypertension -home dose antihypertensives  *  Hypothyroidism -home dose thyroid replacement   * Mixed hyperlipidemia -home dose antilipid    *GERD (gastroesophageal reflux disease) -continue home meds  Overall stable. Will d/c to home after post cath protocol met.   CONSULTS OBTAINED:  Treatment Team:  Minna Merritts, MD  DRUG ALLERGIES:   Allergies  Allergen Reactions  . Neomycin Anaphylaxis    blisters  . Meperidine Hcl     REACTION: UNSPECIFIED  . Pravastatin Sodium     Weakness, resolved after stopping  medicine 2013  . Systane [Polyethyl Glycol-Propyl Glycol] Rash    DISCHARGE MEDICATIONS:   Allergies as of 12/04/2018      Reactions   Neomycin Anaphylaxis   blisters   Meperidine Hcl    REACTION: UNSPECIFIED   Pravastatin Sodium    Weakness, resolved after stopping medicine 2013   Systane [polyethyl Glycol-propyl Glycol] Rash      Medication List    TAKE these medications   acetaminophen 325 MG tablet Commonly known as: TYLENOL Take 650 mg by mouth every 6 (six) hours as needed for mild pain.   aspirin 81 MG tablet Take 81 mg by mouth daily.   baclofen 10 MG tablet Commonly known as: LIORESAL Take 5 mg by mouth 2 (two) times daily.   CALTRATE 600+D PO Take 1 tablet by mouth 2 (two) times a day.   colestipol 1  g tablet Commonly known as: COLESTID Take by mouth 2 (two) times a day.   D 5000 125 MCG (5000 UT) capsule Generic drug: Cholecalciferol Take by mouth daily.   diphenoxylate-atropine 2.5-0.025 MG tablet Commonly known as: LOMOTIL 4 (four) times daily as needed.   DULoxetine 60 MG capsule Commonly known as: CYMBALTA Take 60 mg by mouth every morning.   fenofibrate 145 MG tablet Commonly known as: TRICOR Take 145 mg by mouth daily.   gabapentin 300 MG capsule Commonly known as: NEURONTIN Take 300 mg by mouth 3 (three) times daily.   isosorbide mononitrate 30 MG 24 hr tablet Commonly known as: IMDUR Take 0.5 tablets (15 mg total) by mouth daily.   lamoTRIgine 25 MG tablet Commonly known as: LAMICTAL Take 50 mg by mouth at bedtime.   levothyroxine 25 MCG tablet Commonly known as: SYNTHROID Take 25 mcg by mouth daily before breakfast.   losartan 25 MG tablet Commonly known as: COZAAR Take 25 mg by mouth daily.   Melatonin 5 MG Tabs Take 1 tablet by mouth at bedtime.   metoprolol tartrate 50 MG tablet Commonly known as: LOPRESSOR Take 50 mg by mouth 2 (two) times daily.   multivitamin tablet Take 1 tablet by mouth daily.   nitroGLYCERIN 0.4 MG SL tablet Commonly known as: NITROSTAT Place 1 tablet (0.4 mg total) under the tongue every 5 (five) minutes as needed for chest pain.   ondansetron 4 MG disintegrating tablet Commonly known as: Zofran ODT Take 1 tablet (4 mg total) by mouth every 8 (eight) hours as needed.   pantoprazole 40 MG tablet Commonly known as: PROTONIX Take 40 mg by mouth daily.   primidone 50 MG tablet Commonly known as: MYSOLINE Take 50 mg by mouth 2 (two) times a day.   PROBIOTIC PO Take by mouth daily.   propranolol 10 MG tablet Commonly known as: INDERAL Take 10 mg by mouth 2 (two) times daily.   Vitamin B-12 5000 MCG Subl Take by mouth. Pt takes every fourth day.   vitamin C 1000 MG tablet Take 2,000 mg by mouth daily.        If you experience worsening of your admission symptoms, develop shortness of breath, life threatening emergency, suicidal or homicidal thoughts you must seek medical attention immediately by calling 911 or calling your MD immediately  if symptoms less severe.  You Must read complete instructions/literature along with all the possible adverse reactions/side effects for all the Medicines you take and that have been prescribed to you. Take any new Medicines after you have completely understood and accept all the possible adverse reactions/side effects.  Please note  You were cared for by a hospitalist during your hospital stay. If you have any questions about your discharge medications or the care you received while you were in the hospital after you are discharged, you can call the unit and asked to speak with the hospitalist on call if the hospitalist that took care of you is not available. Once you are discharged, your primary care physician will handle any further medical issues. Please note that NO REFILLS for any discharge medications will be authorized once you are discharged, as it is imperative that you return to your primary care physician (or establish a relationship with a primary care physician if you do not have one) for your aftercare needs so that they can reassess your need for medications and monitor your lab values.  DATA REVIEW:   CBC  Recent Labs  Lab 12/04/18 0451  WBC 6.3  HGB 10.2*  HCT 33.5*  PLT 418*    Chemistries  Recent Labs  Lab 12/03/18 1823 12/04/18 0451  NA 139 140  K 4.8 4.1  CL 104 104  CO2 24 26  GLUCOSE 98 93  BUN 29* 24*  CREATININE 0.99 0.86  CALCIUM 9.2 9.0  AST 23  --   ALT 14  --   ALKPHOS 40  --   BILITOT 0.4  --     Microbiology Results   Recent Results (from the past 240 hour(s))  SARS Coronavirus 2 (CEPHEID - Performed in [place] hospital lab), Hosp Order     Status: None   Collection Time: 12/03/18 11:57 PM   Specimen:  Nasopharyngeal Swab  Result Value Ref Range Status   SARS Coronavirus 2 NEGATIVE NEGATIVE Final    Comment: (NOTE) If result is NEGATIVE SARS-CoV-2 target nucleic acids are NOT DETECTED. The SARS-CoV-2 RNA is generally detectable in upper and lower  respiratory specimens during the acute phase of infection. The lowest  concentration of SARS-CoV-2 viral copies this assay can detect is 250  copies / mL. A negative result does not preclude SARS-CoV-2 infection  and should not be used as the sole basis for treatment or other  patient management decisions.  A negative result may occur with  improper specimen collection / handling, submission of specimen other  than nasopharyngeal swab, presence of viral mutation(s) within the  areas targeted by this assay, and inadequate number of viral copies  (<250 copies / mL). A negative result must be combined with clinical  observations, patient history, and epidemiological information. If result is POSITIVE SARS-CoV-2 target nucleic acids are DETECTED. The SARS-CoV-2 RNA is generally detectable in upper and lower  respiratory specimens dur ing the acute phase of infection.  Positive  results are indicative of active infection with SARS-CoV-2.  Clinical  correlation with patient history and other diagnostic information is  necessary to determine patient infection status.  Positive results do  not rule out bacterial infection or co-infection with other viruses. If result is PRESUMPTIVE POSTIVE SARS-CoV-2 nucleic acids MAY BE PRESENT.   A presumptive positive result was obtained on the submitted specimen  and confirmed on repeat testing.  While 2019 novel coronavirus  (SARS-CoV-2) nucleic acids may be present in the submitted sample  additional confirmatory testing may be necessary for epidemiological  and / or clinical management purposes  to differentiate between  SARS-CoV-2 and other Sarbecovirus currently known to infect humans.  If clinically  indicated additional testing with an alternate test  methodology [phone]) is advised. The SARS-CoV-2 RNA  is generally  detectable in upper and lower respiratory sp ecimens during the acute  phase of infection. The expected result is Negative. Fact Sheet for Patients:  StrictlyIdeas.no Fact Sheet for Healthcare Providers: BankingDealers.co.za This test is not yet approved or cleared by the [place] FDA and has been authorized for detection and/or diagnosis of SARS-CoV-2 by FDA under an Emergency Use Authorization (EUA).  This EUA will remain in effect (meaning this test can be used) for the duration of the COVID-19 declaration under Section 564(b)(1) of the Act, 21 U.S.C. section 360bbb-3(b)(1), unless the authorization is terminated or revoked sooner. Performed at [facility], [address] [place], [place] [address]     RADIOLOGY:  Dg Chest 2 View  Result Date: 12/03/2018 CLINICAL DATA:  Chest pain for 3 days EXAM: CHEST - 2 VIEW COMPARISON:  05/22/2013 FINDINGS: Cardiac shadow is within normal limits. Postsurgical changes are seen along the anterior chest and abdominal wall. No focal infiltrate or sizable effusion is noted. Minimal basilar atelectasis on the right is seen. No bony abnormality is noted. IMPRESSION: Minimal right basilar atelectasis. Electronically Signed   By: Inez Catalina M.D.   On: 12/03/2018 23:49   Ct Angio Chest Pe W And/or Wo Contrast  Result Date: 12/04/2018 CLINICAL DATA:  Chest pain. Chest pain, acute, nonspecific, low prob CAD EXAM: CT ANGIOGRAPHY CHEST WITH CONTRAST TECHNIQUE: Multidetector CT imaging of the chest was performed using the standard protocol during bolus administration of intravenous contrast. Multiplanar CT image reconstructions and MIPs were obtained to evaluate the vascular anatomy. CONTRAST:  28m OMNIPAQUE IOHEXOL 350 MG/ML SOLN COMPARISON:  Radiograph earlier this day. FINDINGS:  Cardiovascular: There are no filling defects within the pulmonary arteries to suggest pulmonary embolus. Thoracic aorta is normal in caliber. No dissection. Conventional branching pattern from the aortic arch. Upper normal heart size. No pericardial effusion. Mediastinum/Nodes: No enlarged mediastinal or hilar lymph nodes. Few small bilateral hilar nodes are likely reactive. Surgical clips or calcifications at the gastroesophageal junction. Left thyroid nodule measures 13 mm, no dedicated further imaging needed due to size. Lungs/Pleura: Minor atelectasis in the right middle lobe and lingula. No confluent airspace disease. No pulmonary edema or pleural effusion. Calcified granuloma in the right lower lobe. Trachea and mainstem bronchi are patent. Few pleural calcifications at the right lung apex. Upper Abdomen: No acute findings. Musculoskeletal: There are no acute or suspicious osseous abnormalities. Postsurgical change bilateral breast. Review of the MIP images confirms the above findings. IMPRESSION: 1. No pulmonary embolus.  No acute intrathoracic abnormality. 2. Mild pleural calcifications in the right upper hemithorax suggesting prior asbestos exposure. Electronically Signed   By: MKeith RakeM.D.   On: 12/04/2018 00:17     CODE STATUS:     Code Status Orders  (From admission, onward)         Start     Ordered   12/04/18 0236  Full code  Continuous     12/04/18 0235        Code Status History    This patient has a current code status but no historical code status.   Advance Care Planning Activity      TOTAL TIME TAKING CARE OF THIS PATIENT: *40* minutes.    SFritzi MandesM.D on 12/04/2018 at 5:36 PM  Between 7am to 6pm - Pager - [phone] After 6pm go to [email or web address] - password [email or web address]  Office  [phone] CC: Primary care physician; BDanelle Berry NP
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
## 2018-12-04 NOTE — Progress Notes (Signed)
Patient ID: Olivia [name], female   DOB: 1940-09-29, 78 y.o.   MRN: [id number] Pt's cath is clean per Dr Rockey Situ. Ok to go home

## 2018-12-04 NOTE — Progress Notes (Signed)
Patient's IVF is complete. Went over discharge instructions with the patient including follow-up appointment and care instructions for her right femoral cath site. Discontinue telemetry monitor and PIV. Wheeled patient for transport.

## 2018-12-04 NOTE — ED Notes (Signed)
Patient sitting in bed watching tv, awaiting bed assignment.

## 2018-12-04 NOTE — Progress Notes (Signed)
Patient back from cath lab, right femoral site is soft and non-tender. Per Dr. Posey Pronto, patient ok to be discharge. Patient will need her IVF to be done and monitor her site for a few hours before discharging. RN will continue to monitor.

## 2018-12-04 NOTE — ED Notes (Addendum)
returned from ct, awaiting bed status.
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
## 2018-12-05 ENCOUNTER — Encounter: Payer: Self-pay | Admitting: Cardiovascular Disease
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
## 2018-12-09 ENCOUNTER — Telehealth: Payer: Self-pay | Admitting: Internal Medicine

## 2018-12-09 NOTE — Telephone Encounter (Signed)

## 2018-12-10 ENCOUNTER — Other Ambulatory Visit: Payer: Self-pay

## 2018-12-10 ENCOUNTER — Ambulatory Visit (INDEPENDENT_AMBULATORY_CARE_PROVIDER_SITE_OTHER): Payer: Medicare Other

## 2018-12-10 ENCOUNTER — Encounter: Payer: Self-pay | Admitting: Physician Assistant

## 2018-12-10 ENCOUNTER — Ambulatory Visit (INDEPENDENT_AMBULATORY_CARE_PROVIDER_SITE_OTHER): Payer: Medicare Other | Admitting: Physician Assistant

## 2018-12-10 VITALS — BP 118/70 | HR 65 | Ht 62.0 in | Wt 167.2 lb

## 2018-12-10 DIAGNOSIS — I701 Atherosclerosis of renal artery: Secondary | ICD-10-CM

## 2018-12-10 DIAGNOSIS — Z0389 Encounter for observation for other suspected diseases and conditions ruled out: Secondary | ICD-10-CM | POA: Diagnosis not present

## 2018-12-10 DIAGNOSIS — R002 Palpitations: Secondary | ICD-10-CM | POA: Diagnosis not present

## 2018-12-10 DIAGNOSIS — I10 Essential (primary) hypertension: Secondary | ICD-10-CM | POA: Diagnosis not present

## 2018-12-10 DIAGNOSIS — R0789 Other chest pain: Secondary | ICD-10-CM | POA: Diagnosis not present

## 2018-12-10 DIAGNOSIS — I200 Unstable angina: Secondary | ICD-10-CM | POA: Diagnosis not present

## 2018-12-10 DIAGNOSIS — IMO0001 Reserved for inherently not codable concepts without codable children: Secondary | ICD-10-CM

## 2018-12-10 DIAGNOSIS — Z79899 Other long term (current) drug therapy: Secondary | ICD-10-CM

## 2018-12-10 NOTE — Patient Instructions (Signed)
Medication Instructions:  Your physician has recommended you make the following change in your medication:   STOP Imdur STOP Propanolol  If you need a refill on your cardiac medications before your next appointment, please call your pharmacy.   Lab work: None ordered If you have labs (blood work) drawn today and your tests are completely normal, you will receive your results only by: Marland Kitchen MyChart Message (if you have MyChart) OR . A paper copy in the mail If you have any lab test that is abnormal or we need to change your treatment, we will call you to review the results.  Testing/Procedures: Your physician has recommended that you wear an zio monitor. Ziot monitors are medical devices that record the heart's electrical activity. Doctors most often [place] these monitors to diagnose arrhythmias. Arrhythmias are problems with the speed or rhythm of the heartbeat. The monitor is a small, portable device. You can wear one while you do your normal daily activities. This is usually used to diagnose what is causing palpitations/syncope (passing out).    Follow-Up: At [facility], you and your health needs are our priority.  As part of our continuing mission to provide you with exceptional heart care, we have created designated Provider Care Teams.  These Care Teams include your primary Cardiologist (physician) and Advanced Practice Providers (APPs -  Physician Assistants and Nurse Practitioners) who all work together to provide you with the care you need, when you need it. You will need a follow up appointment in 3 months.  Please call our office 2 months in advance to schedule this appointment.  You may see Nelva Bush, MD or one of the following Advanced Practice Providers on your designated Care Team:   Murray Hodgkins, NP Christell Faith, PA-C . Marrianne Mood, PA-C  Any Other Special Instructions Will Be Listed Below (If Applicable).  Your physician has recommended that you wear a Zio  monitor. This monitor is a medical device that records the heart's electrical activity. Doctors most often use these monitors to diagnose arrhythmias. Arrhythmias are problems with the speed or rhythm of the heartbeat. The monitor is a small device applied to your chest. You can wear one while you do your normal daily activities. While wearing this monitor if you have any symptoms to push the button and record what you felt. Once you have worn this monitor for the period of time provider prescribed (Usually 14 days), you will return the monitor device in the postage paid box. Once it is returned they will download the data collected and provide [place] with a report which the provider will then review and we will call you with those results. Important tips:  1. Avoid showering during the first 24 hours of wearing the monitor. 2. Avoid excessive sweating to help maximize wear time. 3. Do not submerge the device, no hot tubs, and no swimming pools. 4. Keep any lotions or oils away from the patch. 5. After 24 hours you may shower with the patch on. Take brief showers with your back facing the shower head.  6. Do not remove patch once it has been placed because that will interrupt data and decrease adhesive wear time. 7. Push the button when you have any symptoms and write down what you were feeling. 8. Once you have completed wearing your monitor, remove and place into box which has postage paid and place in your outgoing mailbox.  9. If for some reason you have misplaced your box then call our office  and we can provide another box and/or mail it off for you.

## 2018-12-16 ENCOUNTER — Ambulatory Visit (INDEPENDENT_AMBULATORY_CARE_PROVIDER_SITE_OTHER): Payer: Medicare Other | Admitting: Psychology

## 2018-12-16 DIAGNOSIS — F330 Major depressive disorder, recurrent, mild: Secondary | ICD-10-CM

## 2018-12-29 ENCOUNTER — Ambulatory Visit (INDEPENDENT_AMBULATORY_CARE_PROVIDER_SITE_OTHER): Payer: Medicare Other | Admitting: Psychology

## 2018-12-29 DIAGNOSIS — F330 Major depressive disorder, recurrent, mild: Secondary | ICD-10-CM

## 2018-12-31 ENCOUNTER — Other Ambulatory Visit: Payer: Self-pay

## 2018-12-31 ENCOUNTER — Telehealth: Payer: Self-pay

## 2018-12-31 NOTE — Telephone Encounter (Signed)
-----   Message from Rise Mu, PA-C sent at 12/31/2018  2:54 PM EDT ----- Cardiac monitoring showed the predominant rhythm was sinus with an average rate of 68 bpm. There were rare extra beats from the top and bottom portions of the heart. There were 55 episodes of SVT with the longest episode lasting 1 seconds. There were no sustained arrhythmias or prolonged pauses. Patient triggered events corresponded to sinus rhythm with isolated PACs.   -No evidence of Afib.  -If patient would like, we can increase her Lopressor to 75 mg bid, though if we did this, I would recommend decreasing losartan to 12.5 mg daily in an effort to prevent hypotension.

## 2018-12-31 NOTE — Telephone Encounter (Signed)
Called pt to give monitor results and Christell Faith, PA. Pt sts that she will need to call back to discuss results at another time.  Provided our office telephone number.

## 2019-01-01 NOTE — Telephone Encounter (Signed)
I spoke with the patient regarding her monitor results and Christell Faith, PA's recommendations that we could increase metoprolol if she would like, but would also need to decrease losartan if we did that.  The patient voiced understanding of her results and advised she is doing ok overall and did not want to change her medications at this time.  She has been dealing with a lot of stress remodeling her home. She will let us know if she wants to make any med changes in the future.

## 2019-01-01 NOTE — Telephone Encounter (Signed)
Patient calling to discuss recent monitor testing results   Please call

## 2019-01-26 ENCOUNTER — Ambulatory Visit (INDEPENDENT_AMBULATORY_CARE_PROVIDER_SITE_OTHER): Payer: Medicare Other | Admitting: Psychology

## 2019-01-26 DIAGNOSIS — F330 Major depressive disorder, recurrent, mild: Secondary | ICD-10-CM | POA: Diagnosis not present

## 2019-03-11 ENCOUNTER — Other Ambulatory Visit (HOSPITAL_COMMUNITY): Payer: Self-pay | Admitting: Gastroenterology

## 2019-03-11 ENCOUNTER — Other Ambulatory Visit: Payer: Self-pay | Admitting: Gastroenterology

## 2019-03-11 DIAGNOSIS — R1314 Dysphagia, pharyngoesophageal phase: Secondary | ICD-10-CM

## 2019-03-16 ENCOUNTER — Other Ambulatory Visit: Payer: Self-pay

## 2019-03-16 ENCOUNTER — Ambulatory Visit
Admission: RE | Admit: 2019-03-16 | Discharge: 2019-03-16 | Disposition: A | Discharge status: Home / Self Care | Payer: Medicare Other | Source: Ambulatory Visit | Attending: Gastroenterology | Admitting: Gastroenterology

## 2019-03-16 DIAGNOSIS — R1314 Dysphagia, pharyngoesophageal phase: Secondary | ICD-10-CM | POA: Diagnosis present

## 2019-04-20 ENCOUNTER — Ambulatory Visit: Payer: Medicare Other | Admitting: Urology

## 2019-05-04 ENCOUNTER — Ambulatory Visit (INDEPENDENT_AMBULATORY_CARE_PROVIDER_SITE_OTHER): Payer: Medicare Other | Admitting: Urology

## 2019-05-04 ENCOUNTER — Encounter: Payer: Self-pay | Admitting: Urology

## 2019-05-04 ENCOUNTER — Other Ambulatory Visit: Payer: Self-pay

## 2019-05-04 VITALS — BP 128/84 | HR 86 | Ht 62.0 in | Wt 168.0 lb

## 2019-05-04 DIAGNOSIS — I200 Unstable angina: Secondary | ICD-10-CM | POA: Diagnosis not present

## 2019-05-04 DIAGNOSIS — N3941 Urge incontinence: Secondary | ICD-10-CM

## 2019-05-04 MED ORDER — CIPROFLOXACIN HCL 250 MG PO TABS: 250.0000 mg | ORAL_TABLET | Freq: Two times a day (BID) | ORAL | 0 refills | Status: DC

## 2019-05-04 NOTE — Addendum Note (Signed)
Addended by: Verlene Mayer A on: 05/04/2019 04:22 PM   Modules accepted: Orders
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
## 2019-05-07 ENCOUNTER — Emergency Department
Admission: EM | Admit: 2019-05-07 | Discharge: 2019-05-07 | Disposition: A | Discharge status: Home / Self Care | Payer: Medicare Other | Attending: Emergency Medicine | Admitting: Emergency Medicine

## 2019-05-07 ENCOUNTER — Other Ambulatory Visit: Payer: Self-pay

## 2019-05-07 ENCOUNTER — Encounter: Payer: Self-pay | Admitting: Intensive Care

## 2019-05-07 DIAGNOSIS — K921 Melena: Secondary | ICD-10-CM | POA: Insufficient documentation

## 2019-05-07 DIAGNOSIS — Z5321 Procedure and treatment not carried out due to patient leaving prior to being seen by health care provider: Secondary | ICD-10-CM | POA: Diagnosis not present

## 2019-05-07 LAB — CBC WITH DIFFERENTIAL/PLATELET
Abs Immature Granulocytes: 0.03 10*3/uL (ref 0.00–0.07)
Basophils Absolute: 0.1 10*3/uL (ref 0.0–0.1)
Basophils Relative: 1 %
Eosinophils Absolute: 0.1 10*3/uL (ref 0.0–0.5)
Eosinophils Relative: 3 %
HCT: 40 % (ref 36.0–46.0)
Hemoglobin: 13.1 g/dL (ref 12.0–15.0)
Immature Granulocytes: 1 %
Lymphocytes Relative: 21 %
Lymphs Abs: 1.2 10*3/uL (ref 0.7–4.0)
MCH: 29.7 pg (ref 26.0–34.0)
MCHC: 32.8 g/dL (ref 30.0–36.0)
MCV: 90.7 fL (ref 80.0–100.0)
Monocytes Absolute: 0.4 10*3/uL (ref 0.1–1.0)
Monocytes Relative: 8 %
Neutro Abs: 3.8 10*3/uL (ref 1.7–7.7)
Neutrophils Relative %: 66 %
Platelets: 363 10*3/uL (ref 150–400)
RBC: 4.41 MIL/uL (ref 3.87–5.11)
RDW: 15.2 % (ref 11.5–15.5)
WBC: 5.6 10*3/uL (ref 4.0–10.5)
nRBC: 0 % (ref 0.0–0.2)

## 2019-05-07 LAB — COMPREHENSIVE METABOLIC PANEL
ALT: 15 U/L (ref 0–44)
AST: 21 U/L (ref 15–41)
Albumin: 4.2 g/dL (ref 3.5–5.0)
Alkaline Phosphatase: 36 U/L — ABNORMAL LOW (ref 38–126)
Anion gap: 11 (ref 5–15)
BUN: 28 mg/dL — ABNORMAL HIGH (ref 8–23)
CO2: 27 mmol/L (ref 22–32)
Calcium: 9.5 mg/dL (ref 8.9–10.3)
Chloride: 103 mmol/L (ref 98–111)
Creatinine, Ser: 1.05 mg/dL — ABNORMAL HIGH (ref 0.44–1.00)
GFR calc Af Amer: 59 mL/min — ABNORMAL LOW (ref >60)
GFR calc non Af Amer: 51 mL/min — ABNORMAL LOW (ref >60)
Glucose, Bld: 131 mg/dL — ABNORMAL HIGH (ref 70–99)
Potassium: 4.5 mmol/L (ref 3.5–5.1)
Sodium: 141 mmol/L (ref 135–145)
Total Bilirubin: 0.5 mg/dL (ref 0.3–1.2)
Total Protein: 7.1 g/dL (ref 6.5–8.1)

## 2019-05-07 NOTE — ED Triage Notes (Signed)
Patient c/o black, sticky stools since February. Reports she has had these since she had the noro virus in February. Sees [facility] PA and had blood drawn on Tuesday with no results yet. Patient reports GI doctor called her this morning and told her to come to ER today but would not tell her why.

## 2019-05-08 ENCOUNTER — Telehealth: Payer: Self-pay | Admitting: Emergency Medicine

## 2019-05-08 NOTE — Telephone Encounter (Signed)
Called patient due to lwot to inquire about condition and follow up plans. Left message.   

## 2019-06-23 ENCOUNTER — Telehealth: Payer: Self-pay | Admitting: Urology

## 2019-06-23 DIAGNOSIS — N3941 Urge incontinence: Secondary | ICD-10-CM

## 2019-06-23 NOTE — Telephone Encounter (Signed)
Pt called and would like a call back rto schedule Botox injections that she discussed with Dr Matilde Sprang at her last visit.

## 2019-06-25 NOTE — Telephone Encounter (Addendum)
Lab appointment scheduled, orders placed, Botox instructions reviewed. Voiced understanding.

## 2019-06-26 MED ORDER — CIPROFLOXACIN HCL 250 MG PO TABS: 250.0000 mg | ORAL_TABLET | Freq: Two times a day (BID) | ORAL | 0 refills | Status: DC

## 2019-07-20 ENCOUNTER — Other Ambulatory Visit: Payer: Medicare Other

## 2019-07-20 ENCOUNTER — Other Ambulatory Visit: Payer: Self-pay

## 2019-07-20 DIAGNOSIS — N3941 Urge incontinence: Secondary | ICD-10-CM

## 2019-07-20 LAB — URINALYSIS, COMPLETE
Bilirubin, UA: NEGATIVE
Glucose, UA: NEGATIVE
Ketones, UA: NEGATIVE
Leukocytes,UA: NEGATIVE
Nitrite, UA: NEGATIVE
Protein,UA: NEGATIVE
RBC, UA: NEGATIVE
Specific Gravity, UA: 1.01 (ref 1.005–1.030)
Urobilinogen, Ur: 0.2 mg/dL (ref 0.2–1.0)
pH, UA: 5 (ref 5.0–7.5)

## 2019-07-20 LAB — MICROSCOPIC EXAMINATION
Bacteria, UA: NONE SEEN
RBC, Urine: NONE SEEN /hpf (ref 0–2)
WBC, UA: NONE SEEN /hpf (ref 0–5)

## 2019-07-22 LAB — CULTURE, URINE COMPREHENSIVE

## 2019-07-27 ENCOUNTER — Other Ambulatory Visit: Payer: Self-pay

## 2019-07-27 ENCOUNTER — Ambulatory Visit (INDEPENDENT_AMBULATORY_CARE_PROVIDER_SITE_OTHER): Payer: Medicare Other | Admitting: Urology

## 2019-07-27 ENCOUNTER — Encounter: Payer: Self-pay | Admitting: Urology

## 2019-07-27 VITALS — BP 159/74 | HR 63 | Ht 63.5 in | Wt 169.4 lb

## 2019-07-27 DIAGNOSIS — N3946 Mixed incontinence: Secondary | ICD-10-CM | POA: Diagnosis not present

## 2019-07-27 DIAGNOSIS — N3941 Urge incontinence: Secondary | ICD-10-CM | POA: Diagnosis not present

## 2019-07-27 MED ORDER — LIDOCAINE HCL 2 % IJ SOLN
60.0000 mL | Freq: Once | INTRAMUSCULAR | Status: AC
  Administered 2019-07-27: 1200 mg

## 2019-07-27 MED ORDER — ONABOTULINUMTOXINA 100 UNITS IJ SOLR
100.0000 [IU] | Freq: Once | INTRAMUSCULAR | Status: AC
  Administered 2019-07-27: 100 [IU] via INTRAMUSCULAR
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
## 2019-07-27 NOTE — Progress Notes (Signed)
Bladder Instillation  Due to Botox patient is present today for a Bladder Instillation of 2% lidocaine. Patient was cleaned and prepped in a sterile fashion with betadine and lidocaine 2% jelly was instilled into the urethra.  A 14FR catheter was inserted, urine return was noted 55ml, urine was yellow in color.  60 ml of 2% lidocaine was instilled into the bladder. The catheter was then removed. Patient tolerated well, no complications were noted Patient held in bladder for 30 minutes prior to procedure starting.   Performed by: Verlene Mayer, [place]

## 2019-07-28 LAB — URINALYSIS, COMPLETE
Bilirubin, UA: NEGATIVE
Glucose, UA: NEGATIVE
Ketones, UA: NEGATIVE
Leukocytes,UA: NEGATIVE
Nitrite, UA: NEGATIVE
RBC, UA: NEGATIVE
Specific Gravity, UA: 1.025 (ref 1.005–1.030)
Urobilinogen, Ur: 0.2 mg/dL (ref 0.2–1.0)
pH, UA: 5 (ref 5.0–7.5)

## 2019-07-28 LAB — MICROSCOPIC EXAMINATION
Bacteria, UA: NONE SEEN
RBC, Urine: NONE SEEN /HPF (ref 0–2)

## 2019-08-13 ENCOUNTER — Ambulatory Visit: Payer: Self-pay | Admitting: Physician Assistant

## 2019-08-14 ENCOUNTER — Ambulatory Visit: Payer: Medicare Other | Admitting: Physician Assistant

## 2019-08-14 ENCOUNTER — Encounter: Payer: Self-pay | Admitting: Physician Assistant

## 2019-09-11 ENCOUNTER — Inpatient Hospital Stay
Admission: EM | Admit: 2019-09-11 | Discharge: 2019-09-26 | DRG: 871 | Disposition: E | Payer: Medicare Other | Attending: Pulmonary Disease | Admitting: Pulmonary Disease

## 2019-09-11 ENCOUNTER — Emergency Department: Payer: Medicare Other

## 2019-09-11 DIAGNOSIS — F319 Bipolar disorder, unspecified: Secondary | ICD-10-CM | POA: Diagnosis present

## 2019-09-11 DIAGNOSIS — K8591 Acute pancreatitis with uninfected necrosis, unspecified: Secondary | ICD-10-CM | POA: Diagnosis present

## 2019-09-11 DIAGNOSIS — R6521 Severe sepsis with septic shock: Secondary | ICD-10-CM | POA: Diagnosis present

## 2019-09-11 DIAGNOSIS — R778 Other specified abnormalities of plasma proteins: Secondary | ICD-10-CM

## 2019-09-11 DIAGNOSIS — E782 Mixed hyperlipidemia: Secondary | ICD-10-CM | POA: Diagnosis present

## 2019-09-11 DIAGNOSIS — Z20822 Contact with and (suspected) exposure to covid-19: Secondary | ICD-10-CM | POA: Diagnosis present

## 2019-09-11 DIAGNOSIS — Z833 Family history of diabetes mellitus: Secondary | ICD-10-CM

## 2019-09-11 DIAGNOSIS — I248 Other forms of acute ischemic heart disease: Secondary | ICD-10-CM | POA: Diagnosis present

## 2019-09-11 DIAGNOSIS — Z66 Do not resuscitate: Secondary | ICD-10-CM | POA: Diagnosis present

## 2019-09-11 DIAGNOSIS — A419 Sepsis, unspecified organism: Principal | ICD-10-CM

## 2019-09-11 DIAGNOSIS — I701 Atherosclerosis of renal artery: Secondary | ICD-10-CM | POA: Diagnosis present

## 2019-09-11 DIAGNOSIS — Z803 Family history of malignant neoplasm of breast: Secondary | ICD-10-CM

## 2019-09-11 DIAGNOSIS — Z853 Personal history of malignant neoplasm of breast: Secondary | ICD-10-CM

## 2019-09-11 DIAGNOSIS — Z881 Allergy status to other antibiotic agents status: Secondary | ICD-10-CM

## 2019-09-11 DIAGNOSIS — I5032 Chronic diastolic (congestive) heart failure: Secondary | ICD-10-CM | POA: Diagnosis present

## 2019-09-11 DIAGNOSIS — N170 Acute kidney failure with tubular necrosis: Secondary | ICD-10-CM | POA: Diagnosis present

## 2019-09-11 DIAGNOSIS — G9341 Metabolic encephalopathy: Secondary | ICD-10-CM | POA: Diagnosis present

## 2019-09-11 DIAGNOSIS — J9601 Acute respiratory failure with hypoxia: Secondary | ICD-10-CM | POA: Diagnosis present

## 2019-09-11 DIAGNOSIS — I480 Paroxysmal atrial fibrillation: Secondary | ICD-10-CM | POA: Diagnosis present

## 2019-09-11 DIAGNOSIS — R000 Tachycardia, unspecified: Secondary | ICD-10-CM | POA: Diagnosis not present

## 2019-09-11 DIAGNOSIS — I4892 Unspecified atrial flutter: Secondary | ICD-10-CM

## 2019-09-11 DIAGNOSIS — J9690 Respiratory failure, unspecified, unspecified whether with hypoxia or hypercapnia: Secondary | ICD-10-CM

## 2019-09-11 DIAGNOSIS — N179 Acute kidney failure, unspecified: Secondary | ICD-10-CM | POA: Diagnosis not present

## 2019-09-11 DIAGNOSIS — Z888 Allergy status to other drugs, medicaments and biological substances status: Secondary | ICD-10-CM

## 2019-09-11 DIAGNOSIS — Z8541 Personal history of malignant neoplasm of cervix uteri: Secondary | ICD-10-CM | POA: Diagnosis not present

## 2019-09-11 DIAGNOSIS — I471 Supraventricular tachycardia: Secondary | ICD-10-CM | POA: Diagnosis present

## 2019-09-11 DIAGNOSIS — K559 Vascular disorder of intestine, unspecified: Secondary | ICD-10-CM

## 2019-09-11 DIAGNOSIS — I130 Hypertensive heart and chronic kidney disease with heart failure and stage 1 through stage 4 chronic kidney disease, or unspecified chronic kidney disease: Secondary | ICD-10-CM | POA: Diagnosis present

## 2019-09-11 DIAGNOSIS — Z79890 Hormone replacement therapy (postmenopausal): Secondary | ICD-10-CM

## 2019-09-11 DIAGNOSIS — Z79899 Other long term (current) drug therapy: Secondary | ICD-10-CM

## 2019-09-11 DIAGNOSIS — K529 Noninfective gastroenteritis and colitis, unspecified: Secondary | ICD-10-CM | POA: Diagnosis not present

## 2019-09-11 DIAGNOSIS — Z9013 Acquired absence of bilateral breasts and nipples: Secondary | ICD-10-CM

## 2019-09-11 DIAGNOSIS — E872 Acidosis, unspecified: Secondary | ICD-10-CM

## 2019-09-11 DIAGNOSIS — Z90711 Acquired absence of uterus with remaining cervical stump: Secondary | ICD-10-CM

## 2019-09-11 DIAGNOSIS — I2699 Other pulmonary embolism without acute cor pulmonale: Secondary | ICD-10-CM

## 2019-09-11 DIAGNOSIS — Z515 Encounter for palliative care: Secondary | ICD-10-CM

## 2019-09-11 DIAGNOSIS — Z95820 Peripheral vascular angioplasty status with implants and grafts: Secondary | ICD-10-CM

## 2019-09-11 DIAGNOSIS — E039 Hypothyroidism, unspecified: Secondary | ICD-10-CM | POA: Diagnosis present

## 2019-09-11 DIAGNOSIS — R9389 Abnormal findings on diagnostic imaging of other specified body structures: Secondary | ICD-10-CM

## 2019-09-11 DIAGNOSIS — R570 Cardiogenic shock: Secondary | ICD-10-CM | POA: Diagnosis present

## 2019-09-11 DIAGNOSIS — R9431 Abnormal electrocardiogram [ECG] [EKG]: Secondary | ICD-10-CM | POA: Diagnosis not present

## 2019-09-11 DIAGNOSIS — Z452 Encounter for adjustment and management of vascular access device: Secondary | ICD-10-CM

## 2019-09-11 DIAGNOSIS — J9811 Atelectasis: Secondary | ICD-10-CM | POA: Diagnosis present

## 2019-09-11 DIAGNOSIS — R7989 Other specified abnormal findings of blood chemistry: Secondary | ICD-10-CM

## 2019-09-11 DIAGNOSIS — Z823 Family history of stroke: Secondary | ICD-10-CM

## 2019-09-11 DIAGNOSIS — Z7982 Long term (current) use of aspirin: Secondary | ICD-10-CM | POA: Diagnosis not present

## 2019-09-11 DIAGNOSIS — Z7189 Other specified counseling: Secondary | ICD-10-CM | POA: Diagnosis not present

## 2019-09-11 DIAGNOSIS — K219 Gastro-esophageal reflux disease without esophagitis: Secondary | ICD-10-CM | POA: Diagnosis present

## 2019-09-11 DIAGNOSIS — M797 Fibromyalgia: Secondary | ICD-10-CM | POA: Diagnosis present

## 2019-09-11 DIAGNOSIS — N178 Other acute kidney failure: Secondary | ICD-10-CM | POA: Diagnosis not present

## 2019-09-11 DIAGNOSIS — I4891 Unspecified atrial fibrillation: Secondary | ICD-10-CM

## 2019-09-11 DIAGNOSIS — Z8711 Personal history of peptic ulcer disease: Secondary | ICD-10-CM

## 2019-09-11 DIAGNOSIS — N1831 Chronic kidney disease, stage 3a: Secondary | ICD-10-CM | POA: Diagnosis present

## 2019-09-11 DIAGNOSIS — M069 Rheumatoid arthritis, unspecified: Secondary | ICD-10-CM | POA: Diagnosis present

## 2019-09-11 DIAGNOSIS — Z8249 Family history of ischemic heart disease and other diseases of the circulatory system: Secondary | ICD-10-CM

## 2019-09-11 DIAGNOSIS — Z9049 Acquired absence of other specified parts of digestive tract: Secondary | ICD-10-CM

## 2019-09-11 DIAGNOSIS — E860 Dehydration: Secondary | ICD-10-CM | POA: Diagnosis present

## 2019-09-11 HISTORY — DX: Systemic involvement of connective tissue, unspecified: M35.9

## 2019-09-11 LAB — TROPONIN I (HIGH SENSITIVITY)
Troponin I (High Sensitivity): 648 ng/L (ref <18)
Troponin I (High Sensitivity): 812 ng/L (ref <18)

## 2019-09-11 LAB — TSH: TSH: 3.651 u[IU]/mL (ref 0.350–4.500)

## 2019-09-11 LAB — T4, FREE: Free T4: 0.81 ng/dL (ref 0.61–1.12)

## 2019-09-11 LAB — BLOOD GAS, VENOUS
Acid-base deficit: 15.3 mmol/L — ABNORMAL HIGH (ref 0.0–2.0)
Acid-base deficit: 8.9 mmol/L — ABNORMAL HIGH (ref 0.0–2.0)
Bicarbonate: 14 mmol/L — ABNORMAL LOW (ref 20.0–28.0)
Bicarbonate: 16 mmol/L — ABNORMAL LOW (ref 20.0–28.0)
O2 Saturation: 59.9 %
O2 Saturation: 72.6 %
Patient temperature: 37
Patient temperature: 37
pCO2, Ven: 45 mmHg (ref 44.0–60.0)
pCO2, Ven: 31 mmHg — ABNORMAL LOW (ref 44.0–60.0)
pH, Ven: 7.1 — CL (ref 7.250–7.430)
pH, Ven: 7.32 (ref 7.250–7.430)
pO2, Ven: 44 mmHg (ref 32.0–45.0)
pO2, Ven: 42 mmHg (ref 32.0–45.0)

## 2019-09-11 LAB — LACTIC ACID, PLASMA
Lactic Acid, Venous: 9.8 mmol/L (ref 0.5–1.9)
Lactic Acid, Venous: 4 mmol/L (ref 0.5–1.9)

## 2019-09-11 LAB — MAGNESIUM: Magnesium: 2.5 mg/dL — ABNORMAL HIGH (ref 1.7–2.4)

## 2019-09-11 LAB — CBC WITH DIFFERENTIAL/PLATELET
Abs Immature Granulocytes: 0.12 10*3/uL — ABNORMAL HIGH (ref 0.00–0.07)
Basophils Absolute: 0.1 10*3/uL (ref 0.0–0.1)
Basophils Relative: 1 %
Eosinophils Absolute: 0.1 10*3/uL (ref 0.0–0.5)
Eosinophils Relative: 1 %
HCT: 48 % — ABNORMAL HIGH (ref 36.0–46.0)
Hemoglobin: 16.3 g/dL — ABNORMAL HIGH (ref 12.0–15.0)
Immature Granulocytes: 1 %
Lymphocytes Relative: 10 %
Lymphs Abs: 1.5 10*3/uL (ref 0.7–4.0)
MCH: 32.8 pg (ref 26.0–34.0)
MCHC: 34 g/dL (ref 30.0–36.0)
MCV: 96.6 fL (ref 80.0–100.0)
Monocytes Absolute: 0.9 10*3/uL (ref 0.1–1.0)
Monocytes Relative: 6 %
Neutro Abs: 12.7 10*3/uL — ABNORMAL HIGH (ref 1.7–7.7)
Neutrophils Relative %: 81 %
Platelets: 298 10*3/uL (ref 150–400)
RBC: 4.97 MIL/uL (ref 3.87–5.11)
RDW: 12.8 % (ref 11.5–15.5)
Smear Review: NORMAL
WBC: 15.1 10*3/uL — ABNORMAL HIGH (ref 4.0–10.5)
nRBC: 0 % (ref 0.0–0.2)

## 2019-09-11 LAB — COMPREHENSIVE METABOLIC PANEL
ALT: 33 U/L (ref 0–44)
AST: 78 U/L — ABNORMAL HIGH (ref 15–41)
Albumin: 3.5 g/dL (ref 3.5–5.0)
Alkaline Phosphatase: 56 U/L (ref 38–126)
Anion gap: 23 — ABNORMAL HIGH (ref 5–15)
BUN: 51 mg/dL — ABNORMAL HIGH (ref 8–23)
CO2: 14 mmol/L — ABNORMAL LOW (ref 22–32)
Calcium: 8 mg/dL — ABNORMAL LOW (ref 8.9–10.3)
Chloride: 102 mmol/L (ref 98–111)
Creatinine, Ser: 3.12 mg/dL — ABNORMAL HIGH (ref 0.44–1.00)
GFR calc Af Amer: 16 mL/min — ABNORMAL LOW (ref >60)
GFR calc non Af Amer: 14 mL/min — ABNORMAL LOW (ref >60)
Glucose, Bld: 173 mg/dL — ABNORMAL HIGH (ref 70–99)
Potassium: 3.3 mmol/L — ABNORMAL LOW (ref 3.5–5.1)
Sodium: 139 mmol/L (ref 135–145)
Total Bilirubin: 1.1 mg/dL (ref 0.3–1.2)
Total Protein: 6.5 g/dL (ref 6.5–8.1)

## 2019-09-11 LAB — RESPIRATORY PANEL BY RT PCR (FLU A&B, COVID)
Influenza A by PCR: NEGATIVE
Influenza B by PCR: NEGATIVE
SARS Coronavirus 2 by RT PCR: NEGATIVE

## 2019-09-11 LAB — PROTIME-INR
INR: 1.2 (ref 0.8–1.2)
Prothrombin Time: 15.5 seconds — ABNORMAL HIGH (ref 11.4–15.2)

## 2019-09-11 LAB — APTT: aPTT: 34 seconds (ref 24–36)

## 2019-09-11 LAB — BRAIN NATRIURETIC PEPTIDE: B Natriuretic Peptide: 379 pg/mL — ABNORMAL HIGH (ref 0.0–100.0)

## 2019-09-11 LAB — LIPASE, BLOOD: Lipase: 517 U/L — ABNORMAL HIGH (ref 11–51)

## 2019-09-11 MED ORDER — ONDANSETRON HCL 4 MG/2ML IJ SOLN
4.0000 mg | Freq: Four times a day (QID) | INTRAMUSCULAR | Status: DC | PRN
  Filled 2019-09-11: qty 2

## 2019-09-11 MED ORDER — VANCOMYCIN HCL IN DEXTROSE 1-5 GM/200ML-% IV SOLN
1000.0000 mg | Freq: Once | INTRAVENOUS | Status: AC
  Administered 2019-09-12: 1000 mg via INTRAVENOUS
  Filled 2019-09-11: qty 200

## 2019-09-11 MED ORDER — SODIUM CHLORIDE 0.9 % IV SOLN: 2.0000 g | Freq: Once | INTRAVENOUS | Status: DC

## 2019-09-11 MED ORDER — ACETAMINOPHEN 325 MG PO TABS: 650.0000 mg | ORAL_TABLET | Freq: Four times a day (QID) | ORAL | Status: DC | PRN

## 2019-09-11 MED ORDER — SODIUM CHLORIDE 0.9 % IV BOLUS
1000.0000 mL | Freq: Once | INTRAVENOUS | Status: AC
  Administered 2019-09-11: 1000 mL via INTRAVENOUS

## 2019-09-11 MED ORDER — LACTATED RINGERS IV BOLUS (SEPSIS)
500.0000 mL | Freq: Once | INTRAVENOUS | Status: AC
  Administered 2019-09-12: 500 mL via INTRAVENOUS

## 2019-09-11 MED ORDER — SODIUM CHLORIDE 0.9 % IV SOLN
2.0000 g | Freq: Once | INTRAVENOUS | Status: AC
  Administered 2019-09-11: 2 g via INTRAVENOUS
  Filled 2019-09-11 (×2): qty 2

## 2019-09-11 MED ORDER — LACTATED RINGERS IV SOLN: INTRAVENOUS | Status: DC

## 2019-09-11 MED ORDER — MORPHINE SULFATE (PF) 2 MG/ML IV SOLN
2.0000 mg | INTRAVENOUS | Status: DC | PRN
  Administered 2019-09-12: 2 mg via INTRAVENOUS
  Filled 2019-09-11 (×2): qty 1

## 2019-09-11 MED ORDER — DILTIAZEM HCL-DEXTROSE 125-5 MG/125ML-% IV SOLN (PREMIX)
5.0000 mg/h | INTRAVENOUS | Status: DC
  Administered 2019-09-11: 5 mg/h via INTRAVENOUS
  Filled 2019-09-11: qty 125

## 2019-09-11 MED ORDER — METRONIDAZOLE IN NACL 5-0.79 MG/ML-% IV SOLN
500.0000 mg | Freq: Once | INTRAVENOUS | Status: AC
  Administered 2019-09-11: 500 mg via INTRAVENOUS
  Filled 2019-09-11: qty 100

## 2019-09-11 MED ORDER — METRONIDAZOLE IN NACL 5-0.79 MG/ML-% IV SOLN
500.0000 mg | Freq: Three times a day (TID) | INTRAVENOUS | Status: DC
  Filled 2019-09-11 (×2): qty 100

## 2019-09-11 MED ORDER — DILTIAZEM HCL 25 MG/5ML IV SOLN
INTRAVENOUS | Status: AC
  Filled 2019-09-11: qty 5

## 2019-09-11 MED ORDER — LACTATED RINGERS IV BOLUS: 500.0000 mL | Freq: Once | INTRAVENOUS | Status: DC

## 2019-09-11 MED ORDER — DILTIAZEM HCL-DEXTROSE 125-5 MG/125ML-% IV SOLN (PREMIX): 5.0000 mg/h | INTRAVENOUS | Status: DC

## 2019-09-11 MED ORDER — ADENOSINE 6 MG/2ML IV SOLN
INTRAVENOUS | Status: AC
  Filled 2019-09-11: qty 2

## 2019-09-11 MED ORDER — DILTIAZEM HCL 25 MG/5ML IV SOLN
15.0000 mg | Freq: Once | INTRAVENOUS | Status: AC
  Administered 2019-09-11: 15 mg via INTRAVENOUS

## 2019-09-11 MED ORDER — HEPARIN BOLUS VIA INFUSION
5000.0000 [IU] | Freq: Once | INTRAVENOUS | Status: DC
  Administered 2019-09-12: 5000 [IU] via INTRAVENOUS
  Filled 2019-09-11: qty 5000

## 2019-09-11 MED ORDER — HEPARIN (PORCINE) 25000 UT/250ML-% IV SOLN
1750.0000 [IU]/h | INTRAVENOUS | Status: DC
  Administered 2019-09-12: 1200 [IU]/h via INTRAVENOUS
  Administered 2019-09-13: 1050 [IU]/h via INTRAVENOUS
  Administered 2019-09-13: 1400 [IU]/h via INTRAVENOUS
  Administered 2019-09-14: 1600 [IU]/h via INTRAVENOUS
  Filled 2019-09-11 (×5): qty 250

## 2019-09-11 MED ORDER — ONDANSETRON HCL 4 MG PO TABS: 4.0000 mg | ORAL_TABLET | Freq: Four times a day (QID) | ORAL | Status: DC | PRN

## 2019-09-11 MED ORDER — ACETAMINOPHEN 650 MG RE SUPP: 650.0000 mg | Freq: Four times a day (QID) | RECTAL | Status: DC | PRN

## 2019-09-11 MED ORDER — ADENOSINE 12 MG/4ML IV SOLN
INTRAVENOUS | Status: AC
  Filled 2019-09-11: qty 4

## 2019-09-11 MED ORDER — SODIUM CHLORIDE 0.9 % IV BOLUS
500.0000 mL | Freq: Once | INTRAVENOUS | Status: AC
  Administered 2019-09-11: 500 mL via INTRAVENOUS
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
## 2019-09-11 NOTE — ED Notes (Signed)
Dr. Jari Pigg at bedside. Patient is alert, oriented to self and place. Patient appears more comfortable at time with the high-flow [place], but still has tachypnea at baseline. Patient remains tachycardic. Dr. Jari Pigg aware.

## 2019-09-11 NOTE — ED Notes (Signed)
Taken to CT with RT at bedside.

## 2019-09-11 NOTE — Progress Notes (Signed)
PHARMACY -  BRIEF ANTIBIOTIC NOTE   Pharmacy has received consult(s) for Vancomycin and Cefepime from an ED provider.  The patient's profile has been reviewed for ht/wt/allergies/indication/available labs.    One time order(s) placed for Vancomycin 1g IV and Cefepime 2g IV x 1 dose each.  Further antibiotics/pharmacy consults should be ordered by admitting physician if indicated.                       Thank you, Pearla Dubonnet 09/15/2019  7:18 PM

## 2019-09-11 NOTE — ED Provider Notes (Signed)
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 that patient was full code.  Given patient was now alert and following commands patient was placed on BiPAP for her work of breathing.  Sugar was normal.  Patient was noted to be hypotensive.  Patient was given 1 L of fluids and blood pressures improved although patient remained in a tachycardic narrow rhythm.  Discussed with patient's daughter who stated that she does have a history of A. fib.  Patient was given some IV diltiazem and her heart rates came down so patient was placed on IV drip.  Does not appear that patient is on a blood thinner so I was hesitant to cardiovert patient due to the risk of stroke.   Patient met sepsis criteria so broad-spectrum antibiotics were given patient was given full fluid resuscitation as  well.  Patient's cardiac marker was significantly elevated at 400 and trending up to 800.  Unclear if this could be secondary to PE or ACS or just demand from the A. fib.  CT head was ordered given patient does report having a fall just to make sure no signs of intracranial hemorrhage.  CT abdomen was also ordered because she reported having some nausea and vomiting for a few days and then having some abdominal pain as well.  CT imaging was reassuring therefore I started patient on heparin at a PE dose level since unable to get CT angio due to her significantly elevated creatinine.  On reevaluation after the fluids patient heart rate is still in the 140s but her blood pressure is much better respiratory rate has improved.  Patient's VBG showed patient has metabolic acidosis so I think that patient was breathing heavily to compensate for her acidosis.  As we have corrected that with her downtrending lactate I have trialed her off BiPAP and placed her on nasal cannula and she seems to be breathing much better.  I discussed with the hospital team for admission for her multiple problems.  Family was updated on her critical conditi but seems to be improving from when I first saw her.     ____________________________________________   FINAL CLINICAL IMPRESSION(S) / ED DIAGNOSES   Final diagnoses:  Atrial fibrillation with rapid ventricular response (HCC)  AKI (acute kidney injury) ([place])  Sepsis, due to unspecified organism, unspecified whether acute organ dysfunction present ([place])  Acute respiratory failure with hypoxia ([place])      MEDICATIONS GIVEN DURING THIS VISIT:  Medications  metroNIDAZOLE (FLAGYL) IVPB 500 mg (500 mg Intravenous New Bag/Given 08/27/2019 2213)  vancomycin (VANCOCIN) IVPB 1000 mg/200 mL premix (has no administration in time range)  diltiazem (CARDIZEM) 125 mg in dextrose 5% 125 mL (1 mg/mL) infusion (12.5 mg/hr Intravenous Rate/Dose Change 09/19/2019 2113)  heparin bolus via  infusion 5,000 Units (has no administration in time range)    Followed by  heparin ADULT infusion 100 units/mL (25000 units/264m sodium chloride 0.45%) (has no administration in time range)  lactated ringers bolus 500 mL (has no administration in time range)  lactated ringers infusion (has no administration in time range)  metroNIDAZOLE (FLAGYL) IVPB 500  mg (has no administration in time range)  acetaminophen (TYLENOL) tablet 650 mg (has no administration in time range)    Or  acetaminophen (TYLENOL) suppository 650 mg (has no administration in time range)  morphine 2 MG/ML injection 2 mg (has no administration in time range)  ondansetron (ZOFRAN) tablet 4 mg (has no administration in time range)    Or  ondansetron (ZOFRAN) injection 4 mg (has no administration in time range)  diltiazem (CARDIZEM) injection 15 mg (15 mg Intravenous Given 09/13/2019 1910)  ceFEPIme (MAXIPIME) 2 g in sodium chloride 0.9 % 100 mL IVPB (0 g Intravenous Stopped 09/03/2019 2055)  sodium chloride 0.9 % bolus 1,000 mL (0 mLs Intravenous Stopped 09/15/2019 2023)  sodium chloride 0.9 % bolus 500 mL (500 mLs Intravenous New Bag/Given 09/08/2019 2209)     ED Discharge Orders         Ordered    Amb referral to [facility] Clinic     09/01/2019 2247           Note:  This document was prepared using Dragon voice recognition software and may include unintentional dictation errors.   Vanessa Shelby, MD 09/20/2019 2256

## 2019-09-11 NOTE — Progress Notes (Signed)
ANTICOAGULATION CONSULT NOTE - Initial Consult  Pharmacy Consult for Heparin Indication: pulmonary embolus (presumed)  Allergies  Allergen Reactions  . Neomycin Anaphylaxis    blisters  . Lithium   . Meperidine Hcl     REACTION: UNSPECIFIED  . Pravastatin Sodium     Weakness, resolved after stopping medicine 2013  . Systane [Polyethyl Glycol-Propyl Glycol] Rash    Patient Measurements: Weight: 81.1 kg (178 lb 12.7 oz)    Vital Signs: Temp: 99.3 F (37.4 C) (04/16 1843) Temp Source: Oral (04/16 1843) BP: 106/80 (04/16 2200) Pulse Rate: 143 (04/16 2016)  Labs: Recent Labs    09/14/2019 1841 09/20/2019 2051  HGB 16.3*  --   HCT 48.0*  --   PLT 298  --   APTT 34  --   CREATININE 3.12*  --   TROPONINIHS 648* 812*    Estimated Creatinine Clearance: 15.2 mL/min (A) (by C-G formula based on SCr of 3.12 mg/dL (H)).   Medical History: Past Medical History:  Diagnosis Date  . Agitation   . Allergic    Immunotherapy  . Allergic rhinitis   . ALLERGIC RHINITIS 10/26/2006   Qualifier: Diagnosis of  By: Council Mechanic MD, Hilaria Ota   . Anemia, iron deficiency   . Asthma   . Barrett's esophagus 01/04/2011  . Bipolar affective disorder ([place])   . BIPOLAR AFFECTIVE DISORDER, DEPRESSED, HX OF 10/25/2006   Qualifier: Diagnosis of  By: Zara Council LPN, Mariann Laster    . Blood in stool    Via stool cards  . Blood transfusion without reported diagnosis   . Breast cancer ([place]) 1973   Bilateral Mastectomies  . Carpal tunnel syndrome   . Cervical cancer [facility])    Hysterectomy  . Chest pain   . Chronic kidney disease   . 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 Constipation    Mild  . Depressed   . Diarrhea   . DVT (deep venous thrombosis) ([place])   . Fibromyalgia   . Gastric ulcer    Acute  . Gastritis    Barretts, Stricture  . GERD (gastroesophageal reflux disease)   . Gross hematuria 06/23/2015  . History of kidney stones   . Hyperlipidemia   . Hypertension   .  HYPERTENSION 10/26/2006   Qualifier: Diagnosis of  By: Council Mechanic MD, Hilaria Ota   . Hypothyroidism   . 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 Ophthalmoplegia    Dr. Nanine Means at [facility]  . Osteoarthritis    Dr. Marveen Reeks Dr. Jefm Bryant  . Osteoarthrosis    Dr. Marveen Reeks  . PAF (paroxysmal atrial fibrillation) ([place])   . Pain 01/2010   Declined by [facility] pain clinic  . Pneumonia    with trach / 2 YOA and 14 YOA  . PONV (postoperative nausea and vomiting)    past hx.-none recent  . Renal artery stenosis ([place]) 01/04/2011   S/p angioplasty of vessels x2   . Rheumatoid arthritis(714.0)    Dr. Jefm Bryant  . Sinusitis, acute 07/09/2012    Medications:  (Not in a hospital admission)   Assessment: Baseline labs ordered. APTT WNL.  No anticoagulants per PTA med list.   Goal of Therapy:  Heparin level 0.3-0.7 units/ml Monitor platelets by anticoagulation protocol: Yes   Plan:  Heparin 5000 units bolus x 1 then infusion at 1200 units/hr Check HL ~ 8 hours after heparin started  Hart Robinsons A 09/21/2019,10:21 PM

## 2019-09-11 NOTE — ED Notes (Signed)
Triage is incomplete due to patient's status and acuity. Will call family when able to complete documentation.

## 2019-09-11 NOTE — ED Triage Notes (Addendum)
Per EMS report, patient was unresponsive and family called for EMS. Patient has been ill for two days, feeling dizzy and vomiting. Patient arrived being bagged due to being unresponsive and having a pulse ox of 77% on room air, but became alert and verbal . Patient 's husband reported to EMS that patient had a DNR., but was unable to produce it. Blood sugar was 199. Patient able to converse with Dr. Jari Pigg.

## 2019-09-11 NOTE — ED Notes (Signed)
Dr. Jari Pigg aware of Troponin of 648 and Lactic of 9.8

## 2019-09-11 NOTE — ED Notes (Signed)
Patient is alert and oriented, able to converse with Bi-pap on.

## 2019-09-11 NOTE — ED Notes (Signed)
Etomidate drawn up per Dr. Jari Pigg earlier. Medication was wasted with April B. RN

## 2019-09-11 NOTE — Progress Notes (Signed)
CODE SEPSIS - PHARMACY COMMUNICATION  **Broad Spectrum Antibiotics should be administered within 1 hour of Sepsis diagnosis**  Time Code Sepsis Called/Page Received: 1919  Antibiotics Ordered: Vancomycin, Cefepime  Time of 1st antibiotic administration: 2022  Additional action taken by pharmacy: Nurse called to notify pharmacy that ED Pyxis was out of Cefepime. Single dose was tubed up and later restocked  If necessary, Name of Provider/Nurse Contacted: Devonne Doughty ,PharmD Clinical Pharmacist  09/14/2019  8:29 PM

## 2019-09-12 ENCOUNTER — Inpatient Hospital Stay: Payer: Medicare Other

## 2019-09-12 ENCOUNTER — Inpatient Hospital Stay (HOSPITAL_COMMUNITY)
Admit: 2019-09-12 | Discharge: 2019-09-12 | Disposition: A | Discharge status: Home / Self Care | Payer: Medicare Other | Attending: Pulmonary Disease | Admitting: Pulmonary Disease

## 2019-09-12 ENCOUNTER — Other Ambulatory Visit: Payer: Self-pay

## 2019-09-12 DIAGNOSIS — R9431 Abnormal electrocardiogram [ECG] [EKG]: Secondary | ICD-10-CM

## 2019-09-12 DIAGNOSIS — I4892 Unspecified atrial flutter: Secondary | ICD-10-CM | POA: Diagnosis not present

## 2019-09-12 DIAGNOSIS — K529 Noninfective gastroenteritis and colitis, unspecified: Secondary | ICD-10-CM

## 2019-09-12 DIAGNOSIS — E872 Acidosis: Secondary | ICD-10-CM

## 2019-09-12 DIAGNOSIS — I471 Supraventricular tachycardia: Secondary | ICD-10-CM

## 2019-09-12 DIAGNOSIS — R778 Other specified abnormalities of plasma proteins: Secondary | ICD-10-CM | POA: Diagnosis not present

## 2019-09-12 DIAGNOSIS — N178 Other acute kidney failure: Secondary | ICD-10-CM

## 2019-09-12 DIAGNOSIS — G9341 Metabolic encephalopathy: Secondary | ICD-10-CM

## 2019-09-12 DIAGNOSIS — A419 Sepsis, unspecified organism: Secondary | ICD-10-CM | POA: Diagnosis not present

## 2019-09-12 LAB — URINALYSIS, ROUTINE W REFLEX MICROSCOPIC
Bilirubin Urine: NEGATIVE
Glucose, UA: >=500 mg/dL — AB
Hgb urine dipstick: NEGATIVE
Ketones, ur: NEGATIVE mg/dL
Leukocytes,Ua: NEGATIVE
Nitrite: NEGATIVE
Protein, ur: 100 mg/dL — AB
Specific Gravity, Urine: 1.017 (ref 1.005–1.030)
pH: 5 (ref 5.0–8.0)

## 2019-09-12 LAB — PROTIME-INR
INR: 1.9 — ABNORMAL HIGH (ref 0.8–1.2)
Prothrombin Time: 21.5 seconds — ABNORMAL HIGH (ref 11.4–15.2)

## 2019-09-12 LAB — BLOOD CULTURE ID PANEL (REFLEXED)

## 2019-09-12 LAB — PROCALCITONIN: Procalcitonin: 63.48 ng/mL

## 2019-09-12 LAB — COMPREHENSIVE METABOLIC PANEL
ALT: 58 U/L — ABNORMAL HIGH (ref 0–44)
AST: 177 U/L — ABNORMAL HIGH (ref 15–41)
Albumin: 2.7 g/dL — ABNORMAL LOW (ref 3.5–5.0)
Alkaline Phosphatase: 44 U/L (ref 38–126)
Anion gap: 15 (ref 5–15)
BUN: 57 mg/dL — ABNORMAL HIGH (ref 8–23)
CO2: 18 mmol/L — ABNORMAL LOW (ref 22–32)
Calcium: 7.2 mg/dL — ABNORMAL LOW (ref 8.9–10.3)
Chloride: 104 mmol/L (ref 98–111)
Creatinine, Ser: 3.32 mg/dL — ABNORMAL HIGH (ref 0.44–1.00)
GFR calc Af Amer: 15 mL/min — ABNORMAL LOW (ref >60)
GFR calc non Af Amer: 13 mL/min — ABNORMAL LOW (ref >60)
Glucose, Bld: 142 mg/dL — ABNORMAL HIGH (ref 70–99)
Potassium: 3.5 mmol/L (ref 3.5–5.1)
Sodium: 137 mmol/L (ref 135–145)
Total Bilirubin: 1.4 mg/dL — ABNORMAL HIGH (ref 0.3–1.2)
Total Protein: 5.1 g/dL — ABNORMAL LOW (ref 6.5–8.1)

## 2019-09-12 LAB — RENAL FUNCTION PANEL
Albumin: 2.7 g/dL — ABNORMAL LOW (ref 3.5–5.0)
Anion gap: 18 — ABNORMAL HIGH (ref 5–15)
BUN: 64 mg/dL — ABNORMAL HIGH (ref 8–23)
CO2: 16 mmol/L — ABNORMAL LOW (ref 22–32)
Calcium: 6.2 mg/dL — CL (ref 8.9–10.3)
Chloride: 101 mmol/L (ref 98–111)
Creatinine, Ser: 3.49 mg/dL — ABNORMAL HIGH (ref 0.44–1.00)
GFR calc Af Amer: 14 mL/min — ABNORMAL LOW (ref >60)
GFR calc non Af Amer: 12 mL/min — ABNORMAL LOW (ref >60)
Glucose, Bld: 136 mg/dL — ABNORMAL HIGH (ref 70–99)
Phosphorus: 5.1 mg/dL — ABNORMAL HIGH (ref 2.5–4.6)
Potassium: 4 mmol/L (ref 3.5–5.1)
Sodium: 135 mmol/L (ref 135–145)

## 2019-09-12 LAB — BASIC METABOLIC PANEL
Anion gap: 16 — ABNORMAL HIGH (ref 5–15)
BUN: 64 mg/dL — ABNORMAL HIGH (ref 8–23)
CO2: 17 mmol/L — ABNORMAL LOW (ref 22–32)
Calcium: 6.2 mg/dL — CL (ref 8.9–10.3)
Chloride: 102 mmol/L (ref 98–111)
Creatinine, Ser: 3.74 mg/dL — ABNORMAL HIGH (ref 0.44–1.00)
GFR calc Af Amer: 13 mL/min — ABNORMAL LOW (ref >60)
GFR calc non Af Amer: 11 mL/min — ABNORMAL LOW (ref >60)
Glucose, Bld: 133 mg/dL — ABNORMAL HIGH (ref 70–99)
Potassium: 3.8 mmol/L (ref 3.5–5.1)
Sodium: 135 mmol/L (ref 135–145)

## 2019-09-12 LAB — CBC
HCT: 42.3 % (ref 36.0–46.0)
Hemoglobin: 14.2 g/dL (ref 12.0–15.0)
MCH: 33 pg (ref 26.0–34.0)
MCHC: 33.6 g/dL (ref 30.0–36.0)
MCV: 98.4 fL (ref 80.0–100.0)
Platelets: 234 10*3/uL (ref 150–400)
RBC: 4.3 MIL/uL (ref 3.87–5.11)
RDW: 12.9 % (ref 11.5–15.5)
WBC: 9.5 10*3/uL (ref 4.0–10.5)
nRBC: 0 % (ref 0.0–0.2)

## 2019-09-12 LAB — BLOOD GAS, ARTERIAL
Acid-base deficit: 7.1 mmol/L — ABNORMAL HIGH (ref 0.0–2.0)
Bicarbonate: 15.8 mmol/L — ABNORMAL LOW (ref 20.0–28.0)
O2 Saturation: 98.1 %
Patient temperature: 37
pCO2 arterial: 25 mmHg — ABNORMAL LOW (ref 32.0–48.0)
pH, Arterial: 7.41 (ref 7.350–7.450)
pO2, Arterial: 105 mmHg (ref 83.0–108.0)

## 2019-09-12 LAB — LIPASE, BLOOD: Lipase: 200 U/L — ABNORMAL HIGH (ref 11–51)

## 2019-09-12 LAB — LACTIC ACID, PLASMA
Lactic Acid, Venous: 3.8 mmol/L (ref 0.5–1.9)
Lactic Acid, Venous: 2.8 mmol/L (ref 0.5–1.9)
Lactic Acid, Venous: 3 mmol/L (ref 0.5–1.9)
Lactic Acid, Venous: 3.2 mmol/L (ref 0.5–1.9)

## 2019-09-12 LAB — LIPID PANEL
Cholesterol: 78 mg/dL (ref 0–200)
HDL: 43 mg/dL (ref >40)
LDL Cholesterol: 22 mg/dL (ref 0–99)
Total CHOL/HDL Ratio: 1.8 RATIO
Triglycerides: 63 mg/dL (ref <150)
VLDL: 13 mg/dL (ref 0–40)

## 2019-09-12 LAB — GLUCOSE, CAPILLARY
Glucose-Capillary: 129 mg/dL — ABNORMAL HIGH (ref 70–99)
Glucose-Capillary: 114 mg/dL — ABNORMAL HIGH (ref 70–99)

## 2019-09-12 LAB — TROPONIN I (HIGH SENSITIVITY)
Troponin I (High Sensitivity): 1262 ng/L (ref <18)
Troponin I (High Sensitivity): 1255 ng/L (ref <18)
Troponin I (High Sensitivity): 1288 ng/L (ref <18)

## 2019-09-12 LAB — AMYLASE: Amylase: 320 U/L — ABNORMAL HIGH (ref 28–100)

## 2019-09-12 LAB — PHOSPHORUS: Phosphorus: 3.3 mg/dL (ref 2.5–4.6)

## 2019-09-12 LAB — HEPARIN LEVEL (UNFRACTIONATED): Heparin Unfractionated: 0.71 IU/mL — ABNORMAL HIGH (ref 0.30–0.70)

## 2019-09-12 LAB — ECHOCARDIOGRAM COMPLETE
Height: 62 in
Weight: 2740.76 oz

## 2019-09-12 LAB — MAGNESIUM: Magnesium: 1.7 mg/dL (ref 1.7–2.4)

## 2019-09-12 LAB — MRSA PCR SCREENING: MRSA by PCR: NEGATIVE

## 2019-09-12 MED ORDER — AMIODARONE HCL IN DEXTROSE 360-4.14 MG/200ML-% IV SOLN
60.0000 mg/h | INTRAVENOUS | Status: AC
  Administered 2019-09-12 (×2): 60 mg/h via INTRAVENOUS
  Filled 2019-09-12: qty 200

## 2019-09-12 MED ORDER — AMIODARONE IV BOLUS ONLY 150 MG/100ML
INTRAVENOUS | Status: AC
  Administered 2019-09-12: 150 mg
  Filled 2019-09-12: qty 100

## 2019-09-12 MED ORDER — SODIUM CHLORIDE 0.9 % IV BOLUS
1000.0000 mL | Freq: Once | INTRAVENOUS | Status: AC
  Administered 2019-09-12: 1000 mL via INTRAVENOUS

## 2019-09-12 MED ORDER — STERILE WATER FOR INJECTION IV SOLN
INTRAVENOUS | Status: DC
  Filled 2019-09-12 (×4): qty 850

## 2019-09-12 MED ORDER — PRISMASOL BGK 4/2.5 32-4-2.5 MEQ/L IV SOLN
INTRAVENOUS | Status: DC
  Filled 2019-09-12: qty 5000

## 2019-09-12 MED ORDER — PIPERACILLIN-TAZOBACTAM IN DEX 2-0.25 GM/50ML IV SOLN
2.2500 g | Freq: Three times a day (TID) | INTRAVENOUS | Status: DC
  Administered 2019-09-12: 2.25 g via INTRAVENOUS
  Filled 2019-09-12 (×3): qty 50

## 2019-09-12 MED ORDER — CHLORHEXIDINE GLUCONATE CLOTH 2 % EX PADS
6.0000 | MEDICATED_PAD | Freq: Every day | CUTANEOUS | Status: DC
  Administered 2019-09-15 – 2019-09-17 (×3): 6 via TOPICAL
  Filled 2019-09-12: qty 6

## 2019-09-12 MED ORDER — SODIUM CHLORIDE 0.9 % IV SOLN
250.0000 mL | INTRAVENOUS | Status: DC
  Administered 2019-09-12: 250 mL via INTRAVENOUS

## 2019-09-12 MED ORDER — METOPROLOL TARTRATE 5 MG/5ML IV SOLN
2.5000 mg | Freq: Once | INTRAVENOUS | Status: AC
  Administered 2019-09-12: 2.5 mg via INTRAVENOUS
  Filled 2019-09-12: qty 5

## 2019-09-12 MED ORDER — SODIUM CHLORIDE 0.9 % IV SOLN
1.0000 g | Freq: Three times a day (TID) | INTRAVENOUS | Status: DC
  Administered 2019-09-12 – 2019-09-14 (×5): 1 g via INTRAVENOUS
  Filled 2019-09-12 (×7): qty 1

## 2019-09-12 MED ORDER — HEPARIN SODIUM (PORCINE) 1000 UNIT/ML DIALYSIS
1000.0000 [IU] | INTRAMUSCULAR | Status: DC | PRN
  Administered 2019-09-15: 3000 [IU] via INTRAVENOUS_CENTRAL
  Filled 2019-09-12 (×2): qty 6

## 2019-09-12 MED ORDER — SODIUM CHLORIDE 0.9 % IV BOLUS
500.0000 mL | Freq: Once | INTRAVENOUS | Status: AC
  Administered 2019-09-12: 500 mL via INTRAVENOUS

## 2019-09-12 MED ORDER — CALCIUM GLUCONATE-NACL 1-0.675 GM/50ML-% IV SOLN
1.0000 g | Freq: Once | INTRAVENOUS | Status: AC
  Administered 2019-09-12: 1000 mg via INTRAVENOUS
  Filled 2019-09-12: qty 50

## 2019-09-12 MED ORDER — AMIODARONE LOAD VIA INFUSION
150.0000 mg | Freq: Once | INTRAVENOUS | Status: AC
  Filled 2019-09-12: qty 83.34

## 2019-09-12 MED ORDER — SODIUM CHLORIDE 0.9 % IV SOLN
25.0000 ug/min | INTRAVENOUS | Status: DC
  Administered 2019-09-12: 30 ug/min via INTRAVENOUS
  Filled 2019-09-12 (×2): qty 1

## 2019-09-12 MED ORDER — HYDROCORTISONE NA SUCCINATE PF 100 MG IJ SOLR
100.0000 mg | Freq: Three times a day (TID) | INTRAMUSCULAR | Status: DC
  Administered 2019-09-12 – 2019-09-16 (×12): 100 mg via INTRAVENOUS
  Filled 2019-09-12 (×12): qty 2

## 2019-09-12 MED ORDER — ALBUMIN HUMAN 25 % IV SOLN
25.0000 g | Freq: Once | INTRAVENOUS | Status: AC
  Administered 2019-09-12: 25 g via INTRAVENOUS
  Filled 2019-09-12: qty 100

## 2019-09-12 MED ORDER — PRISMASOL BGK 4/2.5 32-4-2.5 MEQ/L REPLACEMENT SOLN
Status: DC
  Filled 2019-09-12: qty 5000

## 2019-09-12 MED ORDER — ACETAMINOPHEN 10 MG/ML IV SOLN
1000.0000 mg | Freq: Once | INTRAVENOUS | Status: AC
  Administered 2019-09-12: 1000 mg via INTRAVENOUS
  Filled 2019-09-12: qty 100

## 2019-09-12 MED ORDER — SODIUM CHLORIDE 0.9 % IV SOLN: INTRAVENOUS | Status: DC

## 2019-09-12 MED ORDER — SODIUM CHLORIDE 0.9 % IV SOLN
500.0000 mg | Freq: Two times a day (BID) | INTRAVENOUS | Status: DC
  Administered 2019-09-12: 500 mg via INTRAVENOUS
  Filled 2019-09-12: qty 0.5
  Filled 2019-09-12: qty 500

## 2019-09-12 MED ORDER — NOREPINEPHRINE 4 MG/250ML-% IV SOLN
0.0000 ug/min | INTRAVENOUS | Status: DC
  Administered 2019-09-12: 5 ug/min via INTRAVENOUS
  Filled 2019-09-12: qty 250

## 2019-09-12 MED ORDER — ADENOSINE 12 MG/4ML IV SOLN
6.0000 mg | Freq: Once | INTRAVENOUS | Status: AC
  Administered 2019-09-12: 6 mg via INTRAVENOUS
  Filled 2019-09-12: qty 4

## 2019-09-12 MED ORDER — PHENYLEPHRINE CONCENTRATED 100MG/250ML (0.4 MG/ML) INFUSION SIMPLE
0.0000 ug/min | INTRAVENOUS | Status: DC
  Administered 2019-09-12: 80 ug/min via INTRAVENOUS
  Filled 2019-09-12: qty 250

## 2019-09-12 MED ORDER — LACTATED RINGERS IV BOLUS
500.0000 mL | Freq: Once | INTRAVENOUS | Status: AC
  Administered 2019-09-12: 500 mL via INTRAVENOUS

## 2019-09-12 MED ORDER — NOREPINEPHRINE 16 MG/250ML-% IV SOLN
0.0000 ug/min | INTRAVENOUS | Status: DC
  Administered 2019-09-12: 38 ug/min via INTRAVENOUS
  Administered 2019-09-12: 28 ug/min via INTRAVENOUS
  Administered 2019-09-13: 25 ug/min via INTRAVENOUS
  Administered 2019-09-13: 30 ug/min via INTRAVENOUS
  Administered 2019-09-13: 38 ug/min via INTRAVENOUS
  Administered 2019-09-14: 26 ug/min via INTRAVENOUS
  Administered 2019-09-14: 12 ug/min via INTRAVENOUS
  Filled 2019-09-12 (×8): qty 250

## 2019-09-12 MED ORDER — AMIODARONE HCL IN DEXTROSE 360-4.14 MG/200ML-% IV SOLN
INTRAVENOUS | Status: AC
  Administered 2019-09-12: 150 mg via INTRAVENOUS
  Filled 2019-09-12: qty 200

## 2019-09-12 MED ORDER — DIGOXIN 0.25 MG/ML IJ SOLN
0.2500 mg | INTRAMUSCULAR | Status: AC | PRN
  Administered 2019-09-12: 0.25 mg via INTRAVENOUS
  Administered 2019-09-12: 25 mg via INTRAVENOUS
  Filled 2019-09-12 (×2): qty 2

## 2019-09-12 MED ORDER — CEFAZOLIN SODIUM-DEXTROSE 2-4 GM/100ML-% IV SOLN
2.0000 g | Freq: Three times a day (TID) | INTRAVENOUS | Status: DC
  Administered 2019-09-12 – 2019-09-13 (×2): 2 g via INTRAVENOUS
  Filled 2019-09-12 (×4): qty 100

## 2019-09-12 MED ORDER — PERFLUTREN LIPID MICROSPHERE
1.0000 mL | INTRAVENOUS | Status: AC | PRN
  Administered 2019-09-12: 2 mL via INTRAVENOUS
  Filled 2019-09-12: qty 10

## 2019-09-12 MED ORDER — HALOPERIDOL LACTATE 5 MG/ML IJ SOLN
2.0000 mg | Freq: Once | INTRAMUSCULAR | Status: AC
  Administered 2019-09-12: 2 mg via INTRAVENOUS

## 2019-09-12 MED ORDER — AMIODARONE HCL IN DEXTROSE 360-4.14 MG/200ML-% IV SOLN
30.0000 mg/h | INTRAVENOUS | Status: DC
  Administered 2019-09-12: 30 mg/h via INTRAVENOUS

## 2019-09-12 MED ORDER — FENTANYL CITRATE (PF) 100 MCG/2ML IJ SOLN
25.0000 ug | INTRAMUSCULAR | Status: DC | PRN
  Administered 2019-09-12 (×2): 25 ug via INTRAVENOUS
  Administered 2019-09-13: 50 ug via INTRAVENOUS
  Administered 2019-09-13 – 2019-09-14 (×2): 25 ug via INTRAVENOUS
  Administered 2019-09-14 – 2019-09-15 (×3): 50 ug via INTRAVENOUS
  Filled 2019-09-12 (×8): qty 2

## 2019-09-12 MED ORDER — ESMOLOL HCL-SODIUM CHLORIDE 2000 MG/100ML IV SOLN
25.0000 ug/kg/min | INTRAVENOUS | Status: DC
  Administered 2019-09-12 – 2019-09-13 (×2): 25 ug/kg/min via INTRAVENOUS
  Administered 2019-09-13: 32 ug/kg/min via INTRAVENOUS
  Filled 2019-09-12 (×4): qty 100

## 2019-09-12 MED ORDER — VASOPRESSIN 20 UNIT/ML IV SOLN
0.0300 [IU]/min | INTRAVENOUS | Status: DC
  Administered 2019-09-12 – 2019-09-14 (×2): 0.03 [IU]/min via INTRAVENOUS
  Filled 2019-09-12 (×3): qty 2

## 2019-09-12 MED ORDER — SODIUM CHLORIDE 0.9 % IV SOLN: 2.0000 g | INTRAVENOUS | Status: DC

## 2019-09-12 NOTE — Progress Notes (Addendum)
Pt remains tachycardic (HR 140-150's) and now with soft BP on Cardizem gtt. Rhythm looks regular and narrow complex on telemetry.  EKG obtained, which seems more concerning for SVT as compared to A-flutter w/ RVR.    Called and discussed with Dr. Emmit Alexanders of [facility] who reviewed EKG.  After discussion with Dr. Emmit Alexanders, will give 6 mg Adenosine x1 dose, and then if remains tachycardic, will place on Amiodarone gtt.  03:20 Called and discussed with pt's daughter Marcie Bal [facility]) that her mother is now coherent and desires no intubation.  Marcie Bal is in agreement with DNI.  Updated her of her mother's critical status due to multiorgan failure.    Darel Hong, AGACNP-BC [place] [facility] Pager: [phone]

## 2019-09-12 NOTE — Procedures (Signed)
Central Venous Catheter Insertion Procedure Note Olivia [name] FR:[id number] May 24, 1941  Procedure: Insertion of Central Venous Catheter Indications: Assessment of intravascular volume, Drug and/or fluid administration and Frequent blood sampling  Procedure Details Consent: Risks of procedure as well as the alternatives and risks of each were explained to the (patient/caregiver).  Consent for procedure obtained. Time Out: Verified patient identification, verified procedure, site/side was marked, verified correct patient position, special equipment/implants available, medications/allergies/relevent history reviewed, required imaging and test results available.  Performed  Maximum sterile technique was used including antiseptics, cap, gloves, gown, hand hygiene, mask and sheet. Skin prep: Chlorhexidine; local anesthetic administered A antimicrobial bonded/coated triple lumen catheter was placed in the right femoral vein due to emergent situation (pt also requests line be placed in groin and not neck due to chronic spine issues and neck immobility) using the Seldinger technique.  Evaluation Blood flow good Complications: No apparent complications Patient did tolerate procedure well. Chest X-ray ordered to verify placement.  CXR: Not needed as was placed in right femoral vein.  Procedure was performed using Ultrasound for direct visualization of cannulization of Right Femoral Vein.  Line was secured at the 20 cm mark.  BIOPATCH placed to insertion site.     Darel Hong, AGACNP-BC [place] [facility] Pager: [phone]  Olivia [name] 09/12/2019, 6:31 AM

## 2019-09-12 NOTE — Progress Notes (Addendum)
Per cardiology no additional orders at this time. Per Dr Patsey Berthold will order another lactic acid at 18:00. Continue to monitor. MD made aware of patients urine output of 150 ml since 7:00am this morning. Pending nephrology consult. No additional orders at this time.

## 2019-09-12 NOTE — Progress Notes (Addendum)
Pharmacy Antibiotic Note  Olivia [name] is a 79 y.o. female admitted on 09/10/2019 with sepsis.  Pharmacy was consulted for Cefepime dosing >> now switch to Zosyn  Plan: Zosyn 2.25gm IV q8h (renally adjusted)  Weight: 81.1 kg (178 lb 12.7 oz)  Temp (24hrs), Avg:99.3 F (37.4 C), Min:99.3 F (37.4 C), Max:99.3 F (37.4 C)  Recent Labs  Lab 08/28/2019 1841 09/01/2019 2051  WBC 15.1*  --   CREATININE 3.12*  --   LATICACIDVEN 9.8* 4.0*    Estimated Creatinine Clearance: 15.2 mL/min (A) (by C-G formula based on SCr of 3.12 mg/dL (H)).    Allergies  Allergen Reactions  . Neomycin Anaphylaxis    blisters  . Lithium   . Meperidine Hcl     REACTION: UNSPECIFIED  . Pravastatin Sodium     Weakness, resolved after stopping medicine 2013  . Systane [Polyethyl Glycol-Propyl Glycol] Rash    Antimicrobials this admission:   >>    >>   Dose adjustments this admission:   Microbiology results:  BCx:   UCx:    Sputum:    MRSA PCR:   Thank you for allowing pharmacy to be a part of this patient's care.  Hart Robinsons A 09/12/2019 1:08 AM

## 2019-09-12 NOTE — Progress Notes (Signed)
Pt is awake, oriented x4, coherent and able to answer questions appropriately.  There was mention about prior DNR, and during ED stay she was made Full Code by pt's daughter (POA) while pt with altered mental status.   Pt now oriented, and is very adamant that she WOULD NOT WANT INTUBATION, however would like all other resuscitative efforts to be performed.  Orders placed for Limited Code: NO INTUBATION.   Darel Hong, AGACNP-BC [place] [facility] Pager: [phone]

## 2019-09-12 NOTE — Progress Notes (Signed)
Started CRRT at [id number] with no complications. Spoke to Devine from pharmacy regarding heparin drip. It was stopped at 14:33 for line placement and restarted per Dr Patsey Berthold at 19:00. Per Dr Patsey Berthold restarted esmolol, discontinued bicarb drip. Daughter at bedside. Continue to monitor.

## 2019-09-12 NOTE — Progress Notes (Signed)
PHARMACY - PHYSICIAN COMMUNICATION CRITICAL VALUE ALERT - BLOOD CULTURE IDENTIFICATION (BCID)  Olivia [name] is an 79 y.o. female who presented to [facility] on 09/25/2019 with a chief complaint of N/V/D.    Assessment:  Lab reports 1 of 4 bottles w/ GPC, Staph, MecA not detected:  Currently afebrile, WBC 9.5  Name of physician (or Provider) ContactedTana Conch, NP  Current antibiotics: Meropenem  Changes to prescribed antibiotics recommended:  Ancef 2gm IV q8hrs Recommendations accepted by provider  D/W NP, wants to keep Meropenem on board and add Ancef for now, f/u progress  Results for orders placed or performed during the hospital encounter of 09/01/2019  Blood Culture ID Panel (Reflexed) (Collected: 09/06/2019  6:41 PM)  Result Value Ref Range   Enterococcus species NOT DETECTED NOT DETECTED   Listeria monocytogenes NOT DETECTED NOT DETECTED   Staphylococcus species DETECTED (A) NOT DETECTED   Staphylococcus aureus (BCID) NOT DETECTED NOT DETECTED   Methicillin resistance NOT DETECTED NOT DETECTED   Streptococcus species NOT DETECTED NOT DETECTED   Streptococcus agalactiae NOT DETECTED NOT DETECTED   Streptococcus pneumoniae NOT DETECTED NOT DETECTED   Streptococcus pyogenes NOT DETECTED NOT DETECTED   Acinetobacter baumannii NOT DETECTED NOT DETECTED   Enterobacteriaceae species NOT DETECTED NOT DETECTED   Enterobacter cloacae complex NOT DETECTED NOT DETECTED   Escherichia coli NOT DETECTED NOT DETECTED   Klebsiella oxytoca NOT DETECTED NOT DETECTED   Klebsiella pneumoniae NOT DETECTED NOT DETECTED   Proteus species NOT DETECTED NOT DETECTED   Serratia marcescens NOT DETECTED NOT DETECTED   Haemophilus influenzae NOT DETECTED NOT DETECTED   Neisseria meningitidis NOT DETECTED NOT DETECTED   Pseudomonas aeruginosa NOT DETECTED NOT DETECTED   Candida albicans NOT DETECTED NOT DETECTED   Candida glabrata NOT DETECTED NOT DETECTED   Candida krusei NOT DETECTED NOT DETECTED    Candida parapsilosis NOT DETECTED NOT DETECTED   Candida tropicalis NOT DETECTED NOT DETECTED    Hart Robinsons A 09/12/2019  10:18 PM

## 2019-09-12 NOTE — Significant Event (Signed)
Discussed in detail with the patient's daughter and with the patient.  Patient's daughter is at bedside.  They agree that the patient is to be DNR/DNI per her prior wishes.  However, they both agree that they would like all interventions possible short of intubation/mechanical ventilation and/or ACLS/CPR.  We will continue aggressive management as current.  Patient's daughter and the patient also have agreed to CRRT.  Care coordination was discussed with Delton See, RN patient's bedside nurse.  I have discussed the case in detail with Dr. Holley Raring.  Renold Don, MD [place] [facility]

## 2019-09-12 NOTE — Consult Note (Signed)
MEDICATION RELATED CONSULT NOTE - FOLLOW UP   Pharmacy Consult for Review Meds DAILY for adjustment due to CRRT  Allergies  Allergen Reactions  . Neomycin Anaphylaxis    blisters  . Lithium   . Meperidine Hcl     REACTION: UNSPECIFIED  . Pravastatin Sodium     Weakness, resolved after stopping medicine 2013  . Systane [Polyethyl Glycol-Propyl Glycol] Rash    Patient Measurements: Height: 5\' 2"  (157.5 cm) Weight: 77.7 kg (171 lb 4.8 oz) IBW/kg (Calculated) : 50.1  Vital Signs: Temp: 99.9 F (37.7 C) (04/17 1315) Temp Source: Oral (04/17 0539) BP: 82/58 (04/17 1415) Pulse Rate: 128 (04/17 1415) Intake/Output from previous day: 04/16 0701 - 04/17 0700 In: 3293.8 [I.V.:910.6; IV Piggyback:2383.2] Out: 50 [Urine:50] Intake/Output from this shift: Total I/O In: -  Out: 150 [Urine:150]  Labs: Recent Labs    09/19/2019 1841 09/12/19 0446  WBC 15.1* 9.5  HGB 16.3* 14.2  HCT 48.0* 42.3  PLT 298 234  APTT 34  --   CREATININE 3.12* 3.32*  MG 2.5*  --   ALBUMIN 3.5 2.7*  PROT 6.5 5.1*  AST 78* 177*  ALT 33 58*  ALKPHOS 56 44  BILITOT 1.1 1.4*   Estimated Creatinine Clearance: 13.5 mL/min (A) (by C-G formula based on SCr of 3.32 mg/dL (H)).   Microbiology: Recent Results (from the past 720 hour(s))  Blood culture (routine x 2)     Status: None (Preliminary result)   Collection Time: 09/17/2019  6:41 PM   Specimen: BLOOD  Result Value Ref Range Status   Specimen Description BLOOD RIGHT ARM  Final   Special Requests   Final    BOTTLES DRAWN AEROBIC AND ANAEROBIC Blood Culture results may not be optimal due to an inadequate volume of blood received in culture bottles   Culture   Final    NO GROWTH < 12 HOURS Performed at [facility], [address], [place], [place] [address]    Report Status PENDING  Incomplete  Respiratory Panel by RT PCR (Flu A&B, Covid) - Nasopharyngeal Swab     Status: None   Collection Time: 09/23/2019  7:14 PM   Specimen:  Nasopharyngeal Swab  Result Value Ref Range Status   SARS Coronavirus 2 by RT PCR NEGATIVE NEGATIVE Final    Comment: (NOTE) SARS-CoV-2 target nucleic acids are NOT DETECTED. The SARS-CoV-2 RNA is generally detectable in upper respiratoy specimens during the acute phase of infection. The lowest concentration of SARS-CoV-2 viral copies this assay can detect is 131 copies/mL. A negative result does not preclude SARS-Cov-2 infection and should not be used as the sole basis for treatment or other patient management decisions. A negative result may occur with  improper specimen collection/handling, submission of specimen other than nasopharyngeal swab, presence of viral mutation(s) within the areas targeted by this assay, and inadequate number of viral copies (<131 copies/mL). A negative result must be combined with clinical observations, patient history, and epidemiological information. The expected result is Negative. Fact Sheet for Patients:  PinkCheek.be Fact Sheet for Healthcare Providers:  GravelBags.it This test is not yet ap proved or cleared by the [place] FDA and  has been authorized for detection and/or diagnosis of SARS-CoV-2 by FDA under an Emergency Use Authorization (EUA). This EUA will remain  in effect (meaning this test can be used) for the duration of the COVID-19 declaration under Section 564(b)(1) of the Act, 21 U.S.C. section 360bbb-3(b)(1), unless the authorization is terminated or revoked sooner.  Influenza A by PCR NEGATIVE NEGATIVE Final   Influenza B by PCR NEGATIVE NEGATIVE Final    Comment: (NOTE) The Xpert Xpress SARS-CoV-2/FLU/RSV assay is intended as an aid in  the diagnosis of influenza from Nasopharyngeal swab specimens and  should not be used as a sole basis for treatment. Nasal washings and  aspirates are unacceptable for Xpert Xpress SARS-CoV-2/FLU/RSV  testing. Fact Sheet for  Patients: PinkCheek.be Fact Sheet for Healthcare Providers: GravelBags.it This test is not yet approved or cleared by the [place] FDA and  has been authorized for detection and/or diagnosis of SARS-CoV-2 by  FDA under an Emergency Use Authorization (EUA). This EUA will remain  in effect (meaning this test can be used) for the duration of the  Covid-19 declaration under Section 564(b)(1) of the Act, 21  U.S.C. section 360bbb-3(b)(1), unless the authorization is  terminated or revoked. Performed at [facility], [address] [place], [place] [address]   Blood culture (routine x 2)     Status: None (Preliminary result)   Collection Time: 09/04/2019  7:35 PM   Specimen: BLOOD  Result Value Ref Range Status   Specimen Description BLOOD RIGHT ANTECUBITAL  Final   Special Requests   Final    BOTTLES DRAWN AEROBIC AND ANAEROBIC Blood Culture results may not be optimal due to an inadequate volume of blood received in culture bottles   Culture   Final    NO GROWTH < 12 HOURS Performed at [facility], [address] [place], [place] [address]    Report Status PENDING  Incomplete  MRSA PCR Screening     Status: None   Collection Time: 09/12/19  1:07 AM   Specimen: Nasopharyngeal  Result Value Ref Range Status   MRSA by PCR NEGATIVE NEGATIVE Final    Comment:        The GeneXpert MRSA Assay (FDA approved for NASAL specimens only), is one component of a comprehensive MRSA colonization surveillance program. It is not intended to diagnose MRSA infection nor to guide or monitor treatment for MRSA infections. Performed at [facility], [address], [place], [place] [address]     Medications:  Scheduled:  . Chlorhexidine Gluconate Cloth  6 each Topical Daily  . hydrocortisone sod succinate (SOLU-CORTEF) inj  100 mg Intravenous Q8H   Infusions:  .  prismasol BGK 4/2.5    .  prismasol BGK  4/2.5    . sodium chloride 250 mL (09/12/19 0430)  . sodium chloride 50 mL/hr at 09/12/19 0931  . esmolol    . heparin 1,050 Units/hr (09/12/19 1233)  . meropenem (MERREM) IV 500 mg (09/12/19 0954)  . norepinephrine (LEVOPHED) Adult infusion 28 mcg/min (09/12/19 1318)  . prismasol BGK 4/2.5    .  sodium bicarbonate (isotonic) infusion in sterile water 75 mL/hr at 09/12/19 0600  . vasopressin (PITRESSIN) infusion - *FOR SHOCK* 0.03 Units/min (09/12/19 1449)   PRN: acetaminophen **OR** acetaminophen, fentaNYL (SUBLIMAZE) injection, heparin, ondansetron **OR** ondansetron (ZOFRAN) IV  Assessment: 79 yo female admitted with AKI ( Scr 3.12, Baseline: 1.05), Severe sepsis, and acute metabolic encephalopathy. Pharmacy consulted to  review medications DAILY for adjustment due to CRRT.  CRRT initiated 4/17   Plan:  Medications have been reviewed for appropriate dosing with CRRT.  Meropenem dosing adjusted to 1g every 8 hours.   No other medication dose adjustments needed at this time.   Pernell Dupre, PharmD, BCPS Clinical Pharmacist 09/12/2019 3:03 PM

## 2019-09-12 NOTE — Progress Notes (Signed)
[facility]  ROUNDING NOTE   Subjective:   Patient well-known to [place] as we follow her for chronic kidney disease stage III as an outpatient.  Her baseline GFR is 59. She came in with significant nausea, vomiting, and diarrhea. She was found to have significant abdominal distention. CT scan of the abdomen pelvis showed generalized enteritis with possible acute pancreatitis and generalized mesenteric and intra-abdominal edema. She is now found to have significant acute kidney injury.  Objective:  Vital signs in last 24 hours:  Temp:  [98.8 F (37.1 C)-99.9 F (37.7 C)] 99.9 F (37.7 C) (04/17 1315) Pulse Rate:  [58-151] 109 (04/17 1604) Resp:  [21-41] 22 (04/17 1604) BP: (55-157)/(26-111) 111/92 (04/17 1604) SpO2:  [66 %-100 %] 96 % (04/17 1604) FiO2 (%):  [70 %-100 %] 70 % (04/17 0930) Weight:  [77.7 kg-81.1 kg] 77.7 kg (04/17 0539)  Weight change:  Filed Weights   09/10/2019 1833 09/12/19 0539  Weight: 81.1 kg 77.7 kg    Intake/Output: I/O last 3 completed shifts: In: 3293.8 [I.V.:910.6; IV Piggyback:2383.2] Out: 50 [Urine:50]   Intake/Output this shift:  Total I/O In: 2311 [I.V.:1717.8; IV Piggyback:593.3] Out: 150 [Urine:150]  Physical Exam: General: Critically ill-appearing  Head: Normocephalic, atraumatic. Moist oral mucosal membranes  Eyes: Anicteric  Neck: Supple, trachea midline  Lungs:  Scattered rhonchi, normal effort  Heart: S1S2, tachycardic  Abdomen:  Mild diffuse tenderness, distention noted  Extremities: 2+ peripheral edema.  Neurologic: Awake, alert, following commands  Skin: No lesions  Access: No dialysis access at the moment    Basic Metabolic Panel: Recent Labs  Lab 09/21/2019 1841 09/12/19 0446 09/12/19 1005 09/12/19 1440  NA 139 137  --  135  K 3.3* 3.5  --  3.8  CL 102 104  --  102  CO2 14* 18*  --  17*  GLUCOSE 173* 142*  --  133*  BUN 51* 57*  --  64*  CREATININE 3.12* 3.32*  --  3.74*  CALCIUM 8.0* 7.2*  --  6.2*   MG 2.5*  --  1.7  --   PHOS  --   --  3.3  --     Liver Function Tests: Recent Labs  Lab 09/19/2019 1841 09/12/19 0446  AST 78* 177*  ALT 33 58*  ALKPHOS 56 44  BILITOT 1.1 1.4*  PROT 6.5 5.1*  ALBUMIN 3.5 2.7*   Recent Labs  Lab 09/02/2019 2051 09/12/19 0446 09/12/19 0703  LIPASE 517*  --  200*  AMYLASE  --  320*  --    No results for input(s): AMMONIA in the last 168 hours.  CBC: Recent Labs  Lab 08/31/2019 1841 09/12/19 0446  WBC 15.1* 9.5  NEUTROABS 12.7*  --   HGB 16.3* 14.2  HCT 48.0* 42.3  MCV 96.6 98.4  PLT 298 234    Cardiac Enzymes: No results for input(s): CKTOTAL, CKMB, CKMBINDEX, TROPONINI in the last 168 hours.  BNP: Invalid input(s): POCBNP  CBG: Recent Labs  Lab 09/12/19 0102  GLUCAP 129*    Microbiology: Results for orders placed or performed during the hospital encounter of 08/29/2019  Blood culture (routine x 2)     Status: None (Preliminary result)   Collection Time: 09/16/2019  6:41 PM   Specimen: BLOOD  Result Value Ref Range Status   Specimen Description BLOOD RIGHT ARM  Final   Special Requests   Final    BOTTLES DRAWN AEROBIC AND ANAEROBIC Blood Culture results may not be optimal due to  an inadequate volume of blood received in culture bottles   Culture   Final    NO GROWTH < 12 HOURS Performed at [facility], [address] [place], [place] [address]    Report Status PENDING  Incomplete  Respiratory Panel by RT PCR (Flu A&B, Covid) - Nasopharyngeal Swab     Status: None   Collection Time: 09/14/2019  7:14 PM   Specimen: Nasopharyngeal Swab  Result Value Ref Range Status   SARS Coronavirus 2 by RT PCR NEGATIVE NEGATIVE Final    Comment: (NOTE) SARS-CoV-2 target nucleic acids are NOT DETECTED. The SARS-CoV-2 RNA is generally detectable in upper respiratoy specimens during the acute phase of infection. The lowest concentration of SARS-CoV-2 viral copies this assay can detect is 131 copies/mL. A negative result does  not preclude SARS-Cov-2 infection and should not be used as the sole basis for treatment or other patient management decisions. A negative result may occur with  improper specimen collection/handling, submission of specimen other than nasopharyngeal swab, presence of viral mutation(s) within the areas targeted by this assay, and inadequate number of viral copies (<131 copies/mL). A negative result must be combined with clinical observations, patient history, and epidemiological information. The expected result is Negative. Fact Sheet for Patients:  PinkCheek.be Fact Sheet for Healthcare Providers:  GravelBags.it This test is not yet ap proved or cleared by the [place] FDA and  has been authorized for detection and/or diagnosis of SARS-CoV-2 by FDA under an Emergency Use Authorization (EUA). This EUA will remain  in effect (meaning this test can be used) for the duration of the COVID-19 declaration under Section 564(b)(1) of the Act, 21 U.S.C. section 360bbb-3(b)(1), unless the authorization is terminated or revoked sooner.    Influenza A by PCR NEGATIVE NEGATIVE Final   Influenza B by PCR NEGATIVE NEGATIVE Final    Comment: (NOTE) The Xpert Xpress SARS-CoV-2/FLU/RSV assay is intended as an aid in  the diagnosis of influenza from Nasopharyngeal swab specimens and  should not be used as a sole basis for treatment. Nasal washings and  aspirates are unacceptable for Xpert Xpress SARS-CoV-2/FLU/RSV  testing. Fact Sheet for Patients: PinkCheek.be Fact Sheet for Healthcare Providers: GravelBags.it This test is not yet approved or cleared by the [place] FDA and  has been authorized for detection and/or diagnosis of SARS-CoV-2 by  FDA under an Emergency Use Authorization (EUA). This EUA will remain  in effect (meaning this test can be used) for the duration of the   Covid-19 declaration under Section 564(b)(1) of the Act, 21  U.S.C. section 360bbb-3(b)(1), unless the authorization is  terminated or revoked. Performed at [facility], [address] [place], [place] [address]   Blood culture (routine x 2)     Status: None (Preliminary result)   Collection Time: 09/25/2019  7:35 PM   Specimen: BLOOD  Result Value Ref Range Status   Specimen Description BLOOD RIGHT ANTECUBITAL  Final   Special Requests   Final    BOTTLES DRAWN AEROBIC AND ANAEROBIC Blood Culture results may not be optimal due to an inadequate volume of blood received in culture bottles   Culture   Final    NO GROWTH < 12 HOURS Performed at [facility], [address], [place], [place] [address]    Report Status PENDING  Incomplete  MRSA PCR Screening     Status: None   Collection Time: 09/12/19  1:07 AM   Specimen: Nasopharyngeal  Result Value Ref Range Status  MRSA by PCR NEGATIVE NEGATIVE Final    Comment:        The GeneXpert MRSA Assay (FDA approved for NASAL specimens only), is one component of a comprehensive MRSA colonization surveillance program. It is not intended to diagnose MRSA infection nor to guide or monitor treatment for MRSA infections. Performed at [facility], [address] [place], [place] [address]     Coagulation Studies: Recent Labs    09/17/2019 1841 09/12/19 0446  LABPROT 15.5* 21.5*  INR 1.2 1.9*    Urinalysis: Recent Labs    09/09/2019 2351  COLORURINE AMBER*  LABSPEC 1.017  PHURINE 5.0  GLUCOSEU >=500*  HGBUR NEGATIVE  BILIRUBINUR NEGATIVE  KETONESUR NEGATIVE  PROTEINUR 100*  NITRITE NEGATIVE  LEUKOCYTESUR NEGATIVE      Imaging: CT ABDOMEN PELVIS WO CONTRAST  Result Date: 08/28/2019 CLINICAL DATA:  Abdominal trauma. Vomiting for 2 days. EXAM: CT ABDOMEN AND PELVIS WITHOUT CONTRAST TECHNIQUE: Multidetector CT imaging of the abdomen and pelvis was performed following the standard protocol  without IV contrast. COMPARISON:  CT 06/30/2018 FINDINGS: Lower chest: Dependent atelectasis with minimal pleural effusions/pleural thickening. Calcified granuloma at the right lung base. Hepatobiliary: No evidence of focal hepatic abnormality allowing for lack contrast. Small volume of perihepatic free fluid measures simple fluid density. Post cholecystectomy. No biliary dilatation. Pancreas: Mild peripancreatic fat stranding and edema most prominent about the head and uncinate process. No ductal dilatation. Spleen: Normal in size. No evidence of focal abnormality allowing for lack contrast. Perisplenic free fluid measures simple fluid density. Adrenals/Urinary Tract: No evidence of adrenal nodule or hemorrhage. No hydronephrosis. Bilateral renal parenchymal thinning. There is bilateral perinephric edema, left greater than right. 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 Bladder partially distended, no definite bladder wall thickening. Stomach/Bowel: Bowel evaluation is limited in the absence of IV and enteric contrast. Fluid distends the distal esophagus. Surgical clips at the gastroesophageal junction. Minimal intraluminal fluid within partially distended stomach. There is wall thickening about the duodenum and proximal jejunum. Small bowel is diffusely fluid-filled prominent measuring up to 3 cm in the pelvis. There is no discrete transition point. Appendix is surgically absent. Fluid within the cecum, ascending and transverse colon. Remainder of the colon is nondistended.No evidence of pneumatosis. There is generalized mesenteric edema. Vascular/Lymphatic: Aortic atherosclerosis. No aortic aneurysm. No retroperitoneal fluid. 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 Limited assessment for adenopathy, no  grossly enlarged lymph nodes in the abdomen or pelvis. Reproductive: Stable 2.1 cm left ovarian cyst. Right ovary not definitively seen. Prior hysterectomy. Other: Small volume abdominopelvic ascites, measuring simple fluid density. Generalized mesenteric and intra-abdominal edema. No free air. Postsurgical changes the anterior abdominal wall. Musculoskeletal: There are no acute or suspicious osseous abnormalities. Multilevel degenerative change in the lumbar spine. IMPRESSION: 1. Fluid within the distal esophagus, throughout prominent small bowel, cecum and ascending colon with associated mesenteric edema. Overall findings consistent with generalized enteritis. Wall thickening of the duodenum proximal jejunum. 2. Mild peripancreatic fat stranding and edema most prominent about the head and uncinate process, can be seen with acute pancreatitis in the appropriate clinical setting. Recommend correlation with pancreatic enzymes. 3. Small volume abdominopelvic ascites. Generalized mesenteric and intra-abdominal edema. Trace pleural effusions/pleural thickening. 4. 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 5. Stable 2.1 cm left ovarian cyst dating back to  2017, likely benign. 6. Additional stable chronic findings as described. Aortic Atherosclerosis (ICD10-I70.0). Electronically Signed   By: Keith Rake M.D.   On: 08/28/2019 21:53   CT Head Wo Contrast  Result Date: 09/13/2019 CLINICAL DATA:  Unresponsive, headache, hypoxia EXAM: CT HEAD WITHOUT CONTRAST TECHNIQUE: Contiguous axial images were obtained from the base of the skull through the vertex without intravenous contrast. COMPARISON:  10/09/2016 FINDINGS: Brain: No acute infarct or hemorrhage. Lateral ventricles and midline structures are stable. 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 No acute extra-axial fluid collections. No mass effect. Vascular: No hyperdense vessel or unexpected calcification. Skull: Normal. Negative for fracture or  focal lesion. Sinuses/Orbits: Stable bilateral ethmoidectomies. Remaining sinuses are clear. Other: None. IMPRESSION: No acute intracranial process. Electronically Signed   By: Randa Ngo M.D.   On: 08/30/2019 21:43   CT Cervical Spine Wo Contrast  Result Date: 09/15/2019 CLINICAL DATA:  Unresponsive, hypoxia, dizziness, trauma EXAM: CT CERVICAL SPINE WITHOUT CONTRAST TECHNIQUE: Multidetector CT imaging of the cervical spine was performed without intravenous contrast. Multiplanar CT image reconstructions were also generated. COMPARISON:  None. FINDINGS: Alignment: Alignment is anatomic. Skull base and vertebrae: No acute displaced cervical spine fracture. Soft tissues and spinal canal: No prevertebral fluid or swelling. No visible canal hematoma. Disc levels: There is extensive multilevel cervical spondylosis and facet hypertrophy, from C4 through T1. Significant neural foraminal encroachment bilaterally at C3/C4 and right predominant at C4/C5 and C5/C6. Upper chest: Airway is patent.  Lung apices are clear. Other: Reconstructed images demonstrate no additional findings. IMPRESSION: 1. Extensive multilevel cervical degenerative changes. No acute fracture. Electronically Signed   By: Randa Ngo M.D.   On: 09/10/2019 21:42   US Venous Img Lower Bilateral (DVT)  Result Date: 09/12/2019 CLINICAL DATA:  Lower extremity pain. EXAM: BILATERAL LOWER EXTREMITY VENOUS DOPPLER ULTRASOUND TECHNIQUE: Gray-scale sonography with graded compression, as well as color Doppler and duplex ultrasound were performed to evaluate the lower extremity deep venous systems from the level of the common femoral vein and including the common femoral, femoral, profunda femoral, popliteal and calf veins including the posterior tibial, peroneal and gastrocnemius veins when visible. The superficial great saphenous vein was also interrogated. Spectral Doppler was utilized to evaluate flow at rest and with distal augmentation maneuvers  in the common femoral, femoral and popliteal veins. COMPARISON:  None. FINDINGS: RIGHT LOWER EXTREMITY Common Femoral Vein: No evidence of thrombus. Normal compressibility, respiratory phasicity and response to augmentation. Saphenofemoral Junction: No evidence of thrombus. Normal compressibility and flow on color Doppler imaging. Profunda Femoral Vein: No evidence of thrombus. Normal compressibility and flow on color Doppler imaging. Femoral Vein: No evidence of thrombus. Normal compressibility, respiratory phasicity and response to augmentation. Popliteal Vein: No evidence of thrombus. Normal compressibility, respiratory phasicity and response to augmentation. Calf Veins: No evidence of thrombus. Normal compressibility and flow on color Doppler imaging. LEFT LOWER EXTREMITY Common Femoral Vein: No evidence of thrombus. Normal compressibility, respiratory phasicity and response to augmentation. Saphenofemoral Junction: No evidence of thrombus. Normal compressibility and flow on color Doppler imaging. Profunda Femoral Vein: No evidence of thrombus. Normal compressibility and flow on color Doppler imaging. Femoral Vein: No evidence of thrombus. Normal compressibility, respiratory phasicity and response to augmentation. Popliteal Vein: No evidence of thrombus. Normal compressibility, respiratory phasicity and response to augmentation. Calf Veins: No evidence of thrombus. Normal compressibility and flow on color Doppler imaging. Other Findings:  None. IMPRESSION: No evidence of deep venous thrombosis in either lower extremity. Electronically Signed  By: Markus Daft M.D.   On: 09/12/2019 13:10   DG Chest Port 1 View  Result Date: 09/12/2019 CLINICAL DATA:  Respiratory failure EXAM: PORTABLE CHEST 1 VIEW COMPARISON:  Chest x-ray dated 09/10/2019. FINDINGS: Heart size and mediastinal contours are within normal limits. Pacer pad overlies the LEFT heart border. Mild atelectasis/scarring at the LEFT lung base. No  confluent opacity to suggest a developing pneumonia. No evidence of pulmonary edema. No pleural effusion or pneumothorax is seen. IMPRESSION: No active disease. No evidence of pneumonia or pulmonary edema. Electronically Signed   By: Franki Cabot M.D.   On: 09/12/2019 11:27   DG Chest Portable 1 View  Result Date: 09/24/2019 CLINICAL DATA:  Shortness of breath EXAM: PORTABLE CHEST 1 VIEW COMPARISON:  12/03/2018 FINDINGS: Cardiac shadows within normal limits. Lungs are well aerated bilaterally. No focal infiltrate or sizable effusion is seen. No bony abnormality is noted. IMPRESSION: No active disease. Electronically Signed   By: Inez Catalina M.D.   On: 09/09/2019 19:11   ECHOCARDIOGRAM COMPLETE  Result Date: 09/12/2019    ECHOCARDIOGRAM REPORT   Patient Name:   Olivia [name] Date of Exam: 09/12/2019 Medical Rec #:  [id number]       Height:       62.0 in Accession #:    [id number]      Weight:       171.3 lb Date of Birth:  [birth year]        BSA:          1.790 m Patient Age:    79 years        BP:           128/111 mmHg Patient Gender: F               HR:           135 bpm. Exam Location:  [facility] Procedure: 2D Echo and Intracardiac Opacification Agent Indications:     Abnormal ECG  History:         Patient has prior history of Echocardiogram examinations. CAD,                  Arrythmias:Atrial Fibrillation and Atrial Flutter; Risk                  Factors:Hypertension.  Sonographer:     Raliegh Ip Thornton-Maynard Referring Phys:  [id number] Bradly Bienenstock Diagnosing Phys: Ida Rogue MD  Sonographer Comments: TDS due to breast reconstruction. IMPRESSIONS  1. Challenging images. Definity used.  2. Left ventricular ejection fraction, by estimation, is 55 to 60%. The left ventricle has grossly normal function. Left ventricular endocardial border not optimally defined to evaluate regional wall motion. There is moderate left ventricular hypertrophy. Left ventricular diastolic parameters are consistent with Grade I  diastolic dysfunction (impaired relaxation).  3. Right ventricular systolic function was not well visualized. The right ventricular size is normal.  4. The mitral valve was not well visualized.  5. The aortic valve was not well visualized.  6. Tachycardia noted FINDINGS  Left Ventricle: Left ventricular ejection fraction, by estimation, is 55 to 60%. The left ventricle has normal function. Left ventricular endocardial border not optimally defined to evaluate regional wall motion. Definity contrast agent was given IV to delineate the left ventricular endocardial borders. The left ventricular internal cavity size was normal in size. There is moderate left ventricular hypertrophy. Left ventricular diastolic parameters are consistent with Grade I diastolic dysfunction (impaired relaxation). Right Ventricle: The right  ventricular size is normal. No increase in right ventricular wall thickness. Right ventricular systolic function was not well visualized. Left Atrium: Left atrial size was not well visualized. Right Atrium: Right atrial size was not well visualized. Pericardium: The pericardium was not well visualized. Mitral Valve: The mitral valve was not well visualized. Normal mobility of the mitral valve leaflets. No evidence of mitral valve regurgitation. No evidence of mitral valve stenosis. Tricuspid Valve: The tricuspid valve is not well visualized. Tricuspid valve regurgitation is not demonstrated. No evidence of tricuspid stenosis. Aortic Valve: The aortic valve was not well visualized. Aortic valve regurgitation is not visualized. No aortic stenosis is present. Aortic valve peak gradient measures 4.8 mmHg. Pulmonic Valve: The pulmonic valve was not well visualized. Pulmonic valve regurgitation is not visualized. No evidence of pulmonic stenosis. Aorta: The aortic root was not well visualized and the ascending aorta was not well visualized. Venous: The pulmonary veins were not well visualized. The inferior vena  cava is normal in size with greater than 50% respiratory variability, suggesting right atrial pressure of 3 mmHg. IAS/Shunts: The interatrial septum was not well visualized.  LEFT VENTRICLE PLAX 2D LVIDd:         2.63 cm  Diastology LVIDs:         2.20 cm  LV e' lateral:   4.79 cm/s LV PW:         1.36 cm  LV E/e' lateral: 10.9 LV IVS:        1.54 cm  LV e' medial:    3.37 cm/s LVOT diam:     1.70 cm  LV E/e' medial:  15.5 LVOT Area:     2.27 cm  AORTIC VALVE AV Area (Vmax): 1.50 cm AV Vmax:        109.00 cm/s AV Peak Grad:   4.8 mmHg LVOT Vmax:      71.80 cm/s MV E velocity: 52.40 cm/s MV A velocity: 63.50 cm/s  SHUNTS MV E/A ratio:  0.83        Systemic Diam: 1.70 cm Ida Rogue MD Electronically signed by Ida Rogue MD Signature Date/Time: 09/12/2019/3:04:27 PM    Final      Medications:   .  prismasol BGK 4/2.5    .  prismasol BGK 4/2.5    . sodium chloride 250 mL (09/12/19 0430)  . esmolol 25 mcg/kg/min (09/12/19 1532)  . heparin Stopped (09/12/19 1433)  . meropenem (MERREM) IV    . norepinephrine (LEVOPHED) Adult infusion 38 mcg/min (09/12/19 1518)  . prismasol BGK 4/2.5    .  sodium bicarbonate (isotonic) infusion in sterile water 75 mL/hr at 09/12/19 1518  . vasopressin (PITRESSIN) infusion - *FOR SHOCK* 0.03 Units/min (09/12/19 1518)   . Chlorhexidine Gluconate Cloth  6 each Topical Daily  . hydrocortisone sod succinate (SOLU-CORTEF) inj  100 mg Intravenous Q8H   acetaminophen **OR** acetaminophen, fentaNYL (SUBLIMAZE) injection, heparin, ondansetron **OR** ondansetron (ZOFRAN) IV  Assessment/ Plan:  79 y.o. female past history of degenerative disc disease, breast cancer, cervical cancer status post hysterectomy, depression, GERD, hyperlipidemia, hypertension, hypothyroidism, osteophytes, urinary incontinence admitted with nausea, vomiting, diarrhea and found to have generalized enteritis along with pancreatitis.  1.  Acute kidney injury secondary to acute tubular  necrosis/chronic kidney disease stage IIIa baseline EGFR 59.  Patient appears to have severe acute kidney injury now likely related to intraabdominal process and hypotension.  She appears oliguric at the moment.  She also has underlying acidosis.  Therefore recommend continuous renal placement therapy.  Patient's daughter has consented to both placement of temporary dialysis catheter as well as continuous renal placement therapy after discussion of risk, benefits, and alternatives.  2.  Hypotension.  Patient with significant hypotension at the moment.  Agree with pressors to maintain map of 65 or greater.  Solu-Cortef also added.  3.  Metabolic acidosis.  Lactic acidosis contributing significantly.  CRRT should help to treat this partially.   LOS: 1 Kristi Norment 4/17/20215:41 PM

## 2019-09-12 NOTE — Consult Note (Signed)
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   By: Aurther Loft.D.  On: 09/17/2019 21:53   CT Head Wo Contrast  Result Date: 08/28/2019 CLINICAL DATA:  Unresponsive, headache, hypoxia EXAM: CT HEAD WITHOUT CONTRAST TECHNIQUE: Contiguous axial images were obtained from the base of the skull through the vertex without intravenous contrast. COMPARISON:  10/09/2016 FINDINGS: Brain: No acute infarct or hemorrhage. Lateral ventricles and midline structures are stable. 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 No acute extra-axial fluid collections. No mass effect. Vascular: No hyperdense vessel or unexpected calcification. Skull: Normal. Negative for fracture or focal lesion.  Sinuses/Orbits: Stable bilateral ethmoidectomies. Remaining sinuses are clear. Other: None. IMPRESSION: No acute intracranial process. Electronically Signed   By: Randa Ngo M.D.   On: 09/02/2019 21:43   CT Cervical Spine Wo Contrast  Result Date: 09/02/2019 CLINICAL DATA:  Unresponsive, hypoxia, dizziness, trauma EXAM: CT CERVICAL SPINE WITHOUT CONTRAST TECHNIQUE: Multidetector CT imaging of the cervical spine was performed without intravenous contrast. Multiplanar CT image reconstructions were also generated. COMPARISON:  None. FINDINGS: Alignment: Alignment is anatomic. Skull base and vertebrae: No acute displaced cervical spine fracture. Soft tissues and spinal canal: No prevertebral fluid or swelling. No visible canal hematoma. Disc levels: There is extensive multilevel cervical spondylosis and facet hypertrophy, from C4 through T1. Significant neural foraminal encroachment bilaterally at C3/C4 and right predominant at C4/C5 and C5/C6. Upper chest: Airway is patent.  Lung apices are clear. Other: Reconstructed images demonstrate no additional findings. IMPRESSION: 1. Extensive multilevel cervical degenerative changes. No acute fracture. Electronically Signed   By: Randa Ngo M.D.   On: 09/07/2019 21:42   DG Chest Portable 1 View  Result Date: 09/17/2019 CLINICAL DATA:  Shortness of breath EXAM: PORTABLE CHEST 1 VIEW COMPARISON:  12/03/2018 FINDINGS: Cardiac shadows within normal limits. Lungs are well aerated bilaterally. No focal infiltrate or sizable effusion is seen. No bony abnormality is noted. IMPRESSION: No active disease. Electronically Signed   By: Inez Catalina M.D.   On: 09/10/2019 19:11    Assessment and Plan: This is a 79 y.o. female presenting with sepsis and hypoxia in setting of renal failure, nausea, vomiting, and diarrhea.  --No acute surgical needs at this point.  The patient may have had a severe gastroenteritis given that her significant other had somewhat similar  symptoms.  This would lead to the nausea, vomiting, and diarrhea, leading to significant dehydration and hypotension.  There is significant mesenteric edema and some fluid in the abdomen in the CT scan, but no abscess.  Unclear if the pancreatitis is reactive vs primary issue.  Nonetheless, the treatment would be supportive with aggressive IV fluid hydration.  Agree with antibiotics as a precaution, pressors as needed, and heparin drip given the potential for PE or ACS given her high troponin.  Also agree with cardiology consult and workup.  Her troponin keeps increasing and most recent is 1262.  Her lactic acid is improving to 2.8 with hydration, and her lipase also is improving to 200.  If there is concern for worsening pain, or worsening abdominal distention/tightness, may need to check bladder pressure to evaluate for abdominal compartment syndrome.  Will continue to follow with you.  Face-to-face time spent with the patient and care providers was 80 minutes, with more than 50% of the time spent counseling, educating, and coordinating care of the patient.     Melvyn Neth, MD [facility] Pg:  [phone]

## 2019-09-12 NOTE — Progress Notes (Signed)
Pharmacy Antibiotic Note  Olivia [name] is a 79 y.o. female admitted on 08/28/2019 with bacteremia.  Pharmacy has been consulted for Ancef dosing.  Plan: Ancef 2gm IV q8hrs Continue Meropenem 1gm IV q8h, d/w NP  Height: 5\' 2"  (157.5 cm) Weight: 77.7 kg (171 lb 4.8 oz) IBW/kg (Calculated) : 50.1  Temp (24hrs), Avg:98.8 F (37.1 C), Min:98.3 F (36.8 C), Max:99.9 F (37.7 C)  Recent Labs  Lab 09/01/2019 1841 09/20/2019 1841 09/08/2019 2051 09/12/19 0446 09/12/19 0703 09/12/19 1154 09/12/19 1440 09/12/19 1733  WBC 15.1*  --   --  9.5  --   --   --   --   CREATININE 3.12*  --   --  3.32*  --   --  3.74* 3.49*  LATICACIDVEN 9.8*   < > 4.0* 3.8* 2.8* 3.0*  --  3.2*   < > = values in this interval not displayed.    Estimated Creatinine Clearance: 12.8 mL/min (A) (by C-G formula based on SCr of 3.49 mg/dL (H)).    Allergies  Allergen Reactions  . Neomycin Anaphylaxis    blisters  . Lithium   . Meperidine Hcl     REACTION: UNSPECIFIED  . Pravastatin Sodium     Weakness, resolved after stopping medicine 2013  . Systane [Polyethyl Glycol-Propyl Glycol] Rash    Antimicrobials this admission:   >>    >>   Dose adjustments this admission:   Microbiology results:  BCx:   UCx:    Sputum:    MRSA PCR:   Thank you for allowing pharmacy to be a part of this patient's care.  Hart Robinsons A 09/12/2019 10:37 PM

## 2019-09-12 NOTE — Consult Note (Signed)
PHARMACY CONSULT NOTE - FOLLOW UP  Pharmacy Consult for Electrolyte Monitoring and Replacement   Recent Labs: Potassium (mmol/L)  Date Value  09/12/2019 3.8  08/29/2012 3.9   Magnesium (mg/dL)  Date Value  09/21/2019 2.5 (H)   Calcium (mg/dL)  Date Value  09/12/2019 6.2 (LL)   Calcium, Total (mg/dL)  Date Value  08/29/2012 8.9   Albumin (g/dL)  Date Value  09/12/2019 2.7 (L)  08/29/2012 4.0   Phosphorus (mg/dL)  Date Value  04/05/2009 3.9   Sodium (mmol/L)  Date Value  09/12/2019 135  08/29/2012 142   Corrected Calcium: 7.24   Assessment: Pharmacy consulted for electrolyte monitoring and replacement for 79 yo female admitted with AKI. Patient initiated on CRRT 4/17.   Goal of Therapy:  Electrolytes WNL   Plan:  Will order Calcium gluconate 2g IV x 1 dose.  No additional replacement warranted at this time.  Will F/U with AM labs and continue to replace electrolytes as needed.    Pernell Dupre, PharmD, BCPS Clinical Pharmacist 09/12/2019 3:22 PM

## 2019-09-12 NOTE — Progress Notes (Signed)
Dr Patsey Berthold notified of patients troponin level, lactic acid, and calcium level. Pharmacy consulted for electrolytes. MD states check next lactic acid when Renal function checked for CRRT and trending troponin levels. Continue to assess.

## 2019-09-12 NOTE — Progress Notes (Signed)
ANTICOAGULATION CONSULT NOTE - Initial Consult  Pharmacy Consult for Heparin Indication: A. Flutter and presumed PE   Allergies  Allergen Reactions  . Neomycin Anaphylaxis    blisters  . Lithium   . Meperidine Hcl     REACTION: UNSPECIFIED  . Pravastatin Sodium     Weakness, resolved after stopping medicine 2013  . Systane [Polyethyl Glycol-Propyl Glycol] Rash    Patient Measurements: Height: 5\' 2"  (157.5 cm) Weight: 77.7 kg (171 lb 4.8 oz) IBW/kg (Calculated) : 50.1 HEPARIN DW (KG): 67.1  Vital Signs: Temp: 98.8 F (37.1 C) (04/17 0800) Temp Source: Oral (04/17 0539) BP: 110/33 (04/17 1045) Pulse Rate: 125 (04/17 1015)  Labs: Recent Labs    08/31/2019 1841 09/24/2019 1841 08/27/2019 2051 09/12/19 0446 09/12/19 0703 09/12/19 1005 09/12/19 1154  HGB 16.3*  --   --  14.2  --   --   --   HCT 48.0*  --   --  42.3  --   --   --   PLT 298  --   --  234  --   --   --   APTT 34  --   --   --   --   --   --   LABPROT 15.5*  --   --  21.5*  --   --   --   INR 1.2  --   --  1.9*  --   --   --   HEPARINUNFRC  --   --   --   --   --   --  0.71*  CREATININE 3.12*  --   --  3.32*  --   --   --   TROPONINIHS 648*   < > 812*  --  1,262* 1,255*  --    < > = values in this interval not displayed.    Estimated Creatinine Clearance: 13.5 mL/min (A) (by C-G formula based on SCr of 3.32 mg/dL (H)).   Medical History: Past Medical History:  Diagnosis Date  . Agitation   . Allergic    Immunotherapy  . Allergic rhinitis   . ALLERGIC RHINITIS 10/26/2006   Qualifier: Diagnosis of  By: Council Mechanic MD, Hilaria Ota   . Anemia, iron deficiency   . Asthma   . Barrett's esophagus 01/04/2011  . Bipolar affective disorder ([place])   . BIPOLAR AFFECTIVE DISORDER, DEPRESSED, HX OF 10/25/2006   Qualifier: Diagnosis of  By: Zara Council LPN, Mariann Laster    . Blood in stool    Via stool cards  . Blood transfusion without reported diagnosis   . Breast cancer ([place]) 1973   Bilateral Mastectomies  . Carpal tunnel  syndrome   . Cervical cancer [facility])    Hysterectomy  . Chest pain   . Chronic kidney disease   . 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 Constipation    Mild  . Depressed   . Diarrhea   . DVT (deep venous thrombosis) ([place])   . Fibromyalgia   . Gastric ulcer    Acute  . Gastritis    Barretts, Stricture  . GERD (gastroesophageal reflux disease)   . Gross hematuria 06/23/2015  . History of kidney stones   . Hyperlipidemia   . Hypertension   . HYPERTENSION 10/26/2006   Qualifier: Diagnosis of  By: Council Mechanic MD, Hilaria Ota   . Hypothyroidism   . 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 Ophthalmoplegia    Dr. Nanine Means at [facility]  . Osteoarthritis  Dr. Marveen Reeks Dr. Jefm Bryant  . Osteoarthrosis    Dr. Marveen Reeks  . PAF (paroxysmal atrial fibrillation) ([place])   . Pain 01/2010   Declined by [facility] pain clinic  . Pneumonia    with trach / 2 YOA and 14 YOA  . PONV (postoperative nausea and vomiting)    past hx.-none recent  . Renal artery stenosis ([place]) 01/04/2011   S/p angioplasty of vessels x2   . Rheumatoid arthritis(714.0)    Dr. Jefm Bryant  . Sinusitis, acute 07/09/2012    Medications:  Medications Prior to Admission  Medication Sig Dispense Refill Last Dose  . acetaminophen (TYLENOL) 325 MG tablet Take 650 mg by mouth every 6 (six) hours as needed for mild pain.      . Ascorbic Acid (VITAMIN C) 1000 MG tablet Take 2,000 mg by mouth daily.     Marland Kitchen aspirin 81 MG tablet Take 81 mg by mouth daily.     . baclofen (LIORESAL) 10 MG tablet Take 5 mg by mouth 2 (two) times daily.      . Calcium Carbonate-Vitamin D (CALTRATE 600+D PO) Take 1 tablet by mouth 2 (two) times a day.      . Cholecalciferol (D 5000) 125 MCG (5000 UT) capsule Take by mouth daily.      . ciprofloxacin (CIPRO) 250 MG tablet Take 1 tablet (250 mg total) by mouth 2 (two) times daily. Take one day prior to appointment, day of appointment, and day after. 6 tablet 0   . colestipol (COLESTID) 1 g  tablet Take by mouth 2 (two) times a day.     . Cyanocobalamin (VITAMIN B-12) 5000 MCG SUBL Take by mouth. Pt takes every fourth day.     . diphenoxylate-atropine (LOMOTIL) 2.5-0.025 MG tablet 4 (four) times daily as needed.      . DULoxetine (CYMBALTA) 60 MG capsule Take 60 mg by mouth every morning.      . fenofibrate (TRICOR) 145 MG tablet Take 145 mg by mouth daily.     Marland Kitchen gabapentin (NEURONTIN) 300 MG capsule Take 300 mg by mouth 3 (three) times daily.      Marland Kitchen lamoTRIgine (LAMICTAL) 25 MG tablet Take 50 mg by mouth at bedtime.      Marland Kitchen levothyroxine (SYNTHROID) 25 MCG tablet Take 25 mcg by mouth daily before breakfast.     . losartan (COZAAR) 25 MG tablet Take 25 mg by mouth daily.      . Melatonin 5 MG TABS Take 1 tablet by mouth at bedtime.     . metoprolol tartrate (LOPRESSOR) 50 MG tablet Take 50 mg by mouth 2 (two) times daily.     . Multiple Vitamin (MULTIVITAMIN) tablet Take 1 tablet by mouth daily.       . nitroGLYCERIN (NITROSTAT) 0.4 MG SL tablet Place 1 tablet (0.4 mg total) under the tongue every 5 (five) minutes as needed for chest pain. 25 tablet 3   . ondansetron (ZOFRAN ODT) 4 MG disintegrating tablet Take 1 tablet (4 mg total) by mouth every 8 (eight) hours as needed. 20 tablet 0   . pantoprazole (PROTONIX) 40 MG tablet Take 40 mg by mouth daily.      . primidone (MYSOLINE) 50 MG tablet Take 50 mg by mouth 2 (two) times a day.      . Probiotic Product (PROBIOTIC PO) Take by mouth daily.       Assessment: Pharmacy consulted for heparin infusion dosing and monitor for atrial flutter and presumed PE in 79 yo  female. Patient is admitted with acute metabolic encephalopathy and sepsis.  Baseline labs ordered. APTT WNL.  No anticoagulants per PTA med list.   4/16 Heparin infusion started @ 1200 units/hr   Goal of Therapy:  Heparin level 0.3-0.7 units/ml Monitor platelets by anticoagulation protocol: Yes   Plan:  4/17 @ 1154 HL 0.71. Level is slightly subtherapeutic.  Will  decrease heparin infusion to 1050 units/hr.   Recheck Check HL ~ 6 hours after rate adjustment Continue to monitor CBC daily per protocol.   Pernell Dupre, PharmD, BCPS Clinical Pharmacist 09/12/2019 12:30 PM
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
## 2019-09-12 NOTE — Consult Note (Signed)
Name: Olivia [name] MRN: [id number] DOB: 1940/08/27    ADMISSION DATE:  09/02/2019 CONSULTATION DATE:  09/12/2019  REFERRING MD :  Rufina Falco, NP  CHIEF COMPLAINT:  Unresponsiveness  BRIEF PATIENT DESCRIPTION:  79 y.o. Female, DNI, admitted 09/12/19 with Acute Hypoxic Respiratory Failure secondary to ? PE, severe metabolic acidosis, SVT vs. Atrial flutter w/ RVR, elevated troponin (NSTEMI vs. Demand ischemia), Cardiogenic shock (due to rapid rate) & septic shock (in setting of acute pancreatitis & Gastroenteritis), and AKI.  She has been placed on Bicarb gtt, Heparin & Amiodarone gtts.  Cardiology & Nephrology are consulted.  SIGNIFICANT EVENTS  4/17: Admission to [facility] 4/17: Changed to Limited Code (NO INTUBATION) 4/17: Critically ill ~ SVT vs. A.flutter w/ RVR, Resp Failure, shock, AKI.  PCCM, Cardiology, & Nephrology consulted  4/17: Adenosine 6 mg IV x1 dose without noted improvement in HR; Cardizem d/c and placed on Amiodarone gtt  STUDIES:  4/16: CXR>> Cardiac shadows within normal limits. Lungs are well aerated bilaterally. No focal infiltrate or sizable effusion is seen. No bony abnormality is noted. 4/16: CT Head w/o contrast>>No acute intracranial process. 4/16: CT Cervical Spine>> Extensive multilevel cervical degenerative changes. No acute fracture. 4/16: CT Abdomen & Pelvis>>1. Fluid within the distal esophagus, throughout prominent smallbowel, cecum and ascending colon with associated mesenteric edema. Overall findings consistent with generalized enteritis. Wall thickening of the duodenum proximal jejunum. 2. Mild peripancreatic fat stranding and edema most prominent about the head and uncinate process, can be seen with acute pancreatitis in the appropriate clinical setting. Recommend correlation with pancreatic enzymes. 3. Small volume abdominopelvic ascites. Generalized mesenteric and intra-abdominal edema. Trace pleural effusions/pleural thickening. 4.  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 5. Stable 2.1 cm left ovarian cyst dating back to 2017, likely benign. 6. Additional stable chronic findings as described.  4/17: Venous US Bilateral LE>>  CULTURES: SARS-CoV-2 PCR 4/16>> negative Influenza PCR 4/16>> negative Blood culture x2 4/16>> Urine 4/16>> MRSA PCR 4/17>> negative GI panel PCR 4/16>>  ANTIBIOTICS: Cefepime 4/16>> 4/17 Flagyl 4/16>> 4/17 Vancomycin x1 dose in ED 4/16 Zosyn 4/17>>  HISTORY OF PRESENT ILLNESS:   Olivia [name] is a 79 year old female with a past medical history significant for hypertension, hypothyroidism, bipolar affective disorder, CAD (unremarkable left heart cath in July 2020), paroxysmal atrial fibrillation not on anticoagulation who presented to [facility] ED on 09/22/2019 due to unresponsiveness and with acute respiratory distress.  Patient's significant other also reported patient had a 1 week history of nausea, vomiting, and diarrhea after eating at a local restaurant, along with associated left lower quadrant pain.  Upon arrival to the ED she was being bagged by EMS.  It was reported that she was DNR, and when the ED provider tried to touch base with patient's power of attorney regarding intubation, the patient became more cognizant and able to converse, thus was subsequently placed on BiPAP.   Patient was also noted to be hypotensive (BP 83/62) and tachycardic with narrow complex rhythm (HR 150's).  She was given 1 L of IV fluid bolus and IV Cardizem push & drip.  There was noted improvement in BP to 106/80, but minimal improvement in HR.  Initial work-up in the ED revealed potassium 3.3, bicarb 14, glucose 173, BUN 51, creatinine 3.12, anion gap 23, lipase 517, BNP 379, high-sensitivity troponin 648, lactic acid 9.8, WBC 15.1 (with neutrophilia).  CT head and CT cervical spine are both negative for any acute processes.  CT abdomen and pelvis is concerning for  gastroenteritis and acute pancreatitis.  She met  sepsis criteria therefore she was placed on broad-spectrum antibiotics.  Given her hypoxia, there was also concern for possible PE, but unable to obtain CTA Chest due to AKI.  She was empirically placed on Heparin drip due to concern for PE and rising troponin.  She was admitted to [facility] unit by the Hospitalist, with Cardiology consultation.  Shortly after arrival to [facility] unit, pt with Acute Respiratory Distress, along with continued tachycardia (SVT vs. A-flutter w/ RVR) and Hypotension.  PCCM is consulted for further work-up and management of acute hypoxic respiratory failure in the setting of questionable PE and severe metabolic acidosis, along with SVT vs. atrial flutter with RVR, elevated troponin in the setting of demand ischemia vs. NSTEMI, cardiogenic shock & septic shock, severe sepsis secondary to acute pancreatitis and gastroenteritis, and AKI.  Nephrology has been consulted.  After arrival to [facility], Patient was given 6 mg of IV adenosine x1 dose without noted improvement in heart rate.  Cardizem drip was discontinued due to hypotension, and she was subsequently placed on amiodarone drip.  She was given additional IV fluid bolus and placed on Neo-Synephrine drip.  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  has a past surgical history that includes Tonsillectomy; Tracheal dilitation; Foot surgery; Mole removal; Mastectomy; Renal artery stent; Cath Renal Arteries Open (1992); Cosmetic surgery; HOSP [facility] [place] [phone]); Esophagogastroduodenoscopy; Partial hysterectomy (1970); MRI; Colonoscopy (09/11/2000); Esophagogastroduodenoscopy (09/11/2000); Esophagogastroduodenoscopy (10/24/2000); Pelvis Ultra Sound (10/11/2000); Esophagogastroduodenoscopy (02/23/2003); Esophagogastroduodenoscopy; Esophagogastroduodenoscopy (01/28/2004); Esophagogastroduodenoscopy (03/16/2004); Colonoscopy; RAS Ultrasound QUST (02/15/2005); Carotid Ultrasound (09/10/2005); Holter (09/12/2005); MRI (09/12/2005); Metanephrines (09/24/2005); Esophagogastroduodenoscopy (01/23/2007); Esophagogastroduodenoscopy (03/10/2007); Esophagogastroduodenoscopy (09/02/2007); Colonoscopy (10/06/2007); Tumor excision (10/08/2007); Esophagogastroduodenoscopy (11/10/2007); Eye surgery; Appendectomy; Cardiovascular stress test (09/29/2012); Cardiac catheterization; Renal artery angioplasty; Laparoscopic lysis of adhesions (N/A, 06/02/2013); laparoscopy (N/A, 06/02/2013); Cholecystectomy (11/1997); EUS (N/A, 07/08/2013); Abdominal hysterectomy; Colonoscopy with propofol (N/A, 07/03/2016); Esophagogastroduodenoscopy (egd) with propofol (N/A, 07/03/2016); and LEFT HEART CATH AND CORONARY ANGIOGRAPHY (N/A, 12/04/2018). Prior to Admission medications   Medication Sig Start Date End Date Taking? Authorizing Provider  acetaminophen (TYLENOL) 325 MG tablet Take 650 mg by mouth every 6 (six) hours as needed for mild pain.     [provider]    Ascorbic Acid (VITAMIN C) 1000 MG tablet Take 2,000 mg by mouth daily.    [provider]  aspirin 81 MG tablet Take 81 mg by mouth daily.    [provider]  baclofen (LIORESAL) 10 MG tablet Take 5 mg by mouth 2 (two) times daily.  07/03/18   [provider]  Calcium Carbonate-Vitamin D (CALTRATE 600+D PO) Take 1 tablet by mouth 2 (two) times a day.     [provider]  Cholecalciferol (D 5000) 125 MCG (5000 UT) capsule Take by mouth daily.  02/24/18   [provider]  ciprofloxacin (CIPRO) 250 MG tablet Take 1 tablet (250 mg total) by mouth 2 (two) times daily. Take one day prior to appointment, day of appointment, and day after. 06/26/19   Bjorn Loser, MD  colestipol (COLESTID) 1 g tablet Take by mouth 2 (two) times a day. 08/05/18 08/05/19  [provider]  Cyanocobalamin (VITAMIN B-12) 5000 MCG SUBL Take by mouth. Pt takes every fourth day. 02/24/18   [provider]  diphenoxylate-atropine (LOMOTIL) 2.5-0.025 MG tablet 4 (four) times daily as needed.  07/03/18   [provider]  DULoxetine (CYMBALTA) 60 MG capsule Take 60 mg by mouth every morning.     [provider]  fenofibrate (TRICOR) 145 MG tablet Take 145 mg by mouth daily.    [provider]  gabapentin (NEURONTIN) 300 MG capsule Take 300 mg by mouth 3 (three) times daily.  07/03/18   [provider]  lamoTRIgine (LAMICTAL) 25 MG tablet Take 50 mg by mouth at bedtime.     [provider]  levothyroxine (SYNTHROID) 25 MCG tablet Take 25 mcg by mouth daily before breakfast.    [provider]  losartan (COZAAR) 25 MG tablet Take 25 mg by mouth daily.     [provider]  Melatonin 5 MG TABS Take 1 tablet by mouth at bedtime.    [provider]  metoprolol tartrate (LOPRESSOR) 50 MG tablet Take 50 mg by mouth 2 (two) times daily.    [provider]  Multiple Vitamin (MULTIVITAMIN) tablet Take 1  tablet by mouth daily.      [provider]  nitroGLYCERIN (NITROSTAT) 0.4 MG SL tablet Place 1 tablet (0.4 mg total) under the tongue every 5 (five) minutes as needed for chest pain. 09/22/18 12/21/18  End, Harrell Gave, MD  ondansetron (ZOFRAN ODT) 4 MG disintegrating tablet Take 1 tablet (4 mg total) by mouth every 8 (eight) hours as needed. 06/30/18   Gregor Hams, MD  pantoprazole (PROTONIX) 40 MG tablet Take 40 mg by mouth daily.  10/19/16   [provider]  primidone (MYSOLINE) 50 MG tablet Take 50 mg by mouth 2 (two) times a day.     [provider]  Probiotic Product (PROBIOTIC PO) Take by mouth daily.    [provider]   Allergies  Allergen Reactions  . Neomycin Anaphylaxis    blisters  . Lithium   . Meperidine Hcl     REACTION: UNSPECIFIED  . Pravastatin Sodium     Weakness, resolved after stopping medicine 2013  . Systane [Polyethyl Glycol-Propyl Glycol] Rash    FAMILY HISTORY:  family history includes Breast cancer in her mother; Cancer in her mother and another family member; Coronary artery disease in her father and mother; Dementia in her father; Diabetes in her mother and another family member; Heart disease in her brother, father, and mother; Heart murmur in her brother; Hyperlipidemia in an other family member; Hypertension in her mother; Other in her father and another family member; Stroke in her father. SOCIAL HISTORY:  reports that she has never smoked. She has never used smokeless tobacco. She reports that she does not drink alcohol or use drugs.   COVID-19 DISASTER DECLARATION:  FULL CONTACT PHYSICAL EXAMINATION WAS NOT POSSIBLE DUE TO TREATMENT OF COVID-19 AND  CONSERVATION OF PERSONAL PROTECTIVE EQUIPMENT, LIMITED EXAM FINDINGS INCLUDE-  Patient assessed or the symptoms described in the history of present illness.  In the context of the Global COVID-19 pandemic, which necessitated consideration that the patient  might be  at risk for infection with the SARS-CoV-2 virus that causes COVID-19, Institutional protocols and algorithms that pertain to the evaluation of patients at risk for COVID-19 are in a state of rapid change based on information released by regulatory bodies including the CDC and federal and state organizations. These policies and algorithms were followed during the patient's care while in hospital.  REVIEW OF SYSTEMS:  Positives in BOLD  Constitutional: Negative for fever, chills, weight loss, malaise/fatigue and diaphoresis.  HENT: Negative for hearing loss, ear pain, nosebleeds, congestion, sore throat, neck pain, tinnitus and ear discharge.   Eyes: Negative for blurred vision, double vision, photophobia, pain, discharge and redness.  Respiratory: Negative for cough, hemoptysis, sputum production, +shortness of breath, wheezing and stridor.   Cardiovascular: Negative for chest pain, +palpitations, orthopnea, claudication, leg swelling and PND.  Gastrointestinal: Negative for heartburn, nausea, vomiting, abdominal pain, diarrhea, constipation, blood in stool and melena.  Genitourinary: Negative for dysuria, urgency, frequency, hematuria and flank pain.  Musculoskeletal: Negative for myalgias, back pain, joint pain and falls.  Skin: Negative for itching and rash.  Neurological: Negative for dizziness, tingling, tremors, sensory change, speech change, focal weakness, seizures, loss of consciousness, weakness and headaches.  Endo/Heme/Allergies: Negative for environmental allergies and polydipsia. Does not bruise/bleed easily.  SUBJECTIVE:  Pt reports SOB, palpitations, and abdominal tenderness to palpitation Denies chest pain, dizziness, N/V, fever/chills, edema On HFNC  VITAL SIGNS: Temp:  [98.8 F (37.1 C)-99.3 F (37.4 C)] 98.8 F (37.1 C) (04/17 0115) Pulse Rate:  [121-151] 141 (04/17 0115) Resp:  [21-41] 36 (04/17 0200) BP: (82-125)/(38-89) 101/64 (04/17 0200) SpO2:  [85 %-100 %] 94 %  (04/17 0115) FiO2 (%):  [100 %] 100 % (04/16 1859) Weight:  [81.1 kg] 81.1 kg (04/16 1833)  PHYSICAL EXAMINATION: General:  Critically ill appearing female, laying in bed, on HFNC, with moderate respiratory distress Neuro:  Awake, oriented x4, follows commands, no focal deficits, speech clear HEENT:  Atraumatic, normocephalic, neck supple, no JVD Cardiovascular:  Tachycardia, regular rhythm, 1+ pulses, cool extremities, no edema Lungs:  Clear to auscultation bilaterally, no wheezing or rales, tachycpnea, assessory muscle use present Abdomen:  Soft, tender to palpitation, nondistended, no guarding or rebound tenderness Musculoskeletal:  Generalized weakness, no deformities, no edema Skin:  Cool and dry.  No obvious rashes, lesions, or ulcerations  Recent Labs  Lab 08/27/2019 1841  NA 139  K 3.3*  CL 102  CO2 14*  BUN 51*  CREATININE 3.12*  GLUCOSE 173*   Recent Labs  Lab 09/10/2019 1841  HGB 16.3*  HCT 48.0*  WBC 15.1*  PLT 298   CT ABDOMEN PELVIS WO CONTRAST  Result Date: 08/31/2019 CLINICAL DATA:  Abdominal trauma. Vomiting for 2 days. EXAM: CT ABDOMEN AND PELVIS WITHOUT CONTRAST TECHNIQUE: Multidetector CT imaging of the abdomen and pelvis was performed following the standard protocol without IV contrast. COMPARISON:  CT 06/30/2018 FINDINGS: Lower chest: Dependent atelectasis with minimal pleural effusions/pleural thickening. Calcified granuloma at the right lung base. Hepatobiliary: No evidence of focal hepatic abnormality allowing for lack contrast. Small volume of perihepatic free fluid measures simple fluid density. Post cholecystectomy. No biliary dilatation. Pancreas: Mild peripancreatic fat stranding and edema most prominent about the head and uncinate process. No ductal dilatation. Spleen: Normal in size. No evidence of focal abnormality allowing for lack contrast. Perisplenic free fluid measures simple fluid density. Adrenals/Urinary Tract: No evidence of adrenal nodule or  hemorrhage. No hydronephrosis. Bilateral renal parenchymal thinning. There is bilateral perinephric edema, left  greater than right. 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 Bladder partially distended, no definite bladder wall thickening. Stomach/Bowel: Bowel evaluation is limited in the absence of IV and enteric contrast. Fluid distends the distal esophagus. Surgical clips at the gastroesophageal junction. Minimal intraluminal fluid within partially distended stomach. There is wall thickening about the duodenum and proximal jejunum. Small bowel is diffusely fluid-filled prominent measuring up to 3 cm in the pelvis. There is no discrete transition point. Appendix is surgically absent. Fluid within the cecum, ascending and transverse colon. Remainder of the colon is nondistended.No evidence of pneumatosis. There is generalized mesenteric edema. Vascular/Lymphatic: Aortic atherosclerosis. No aortic aneurysm. No retroperitoneal fluid. 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 Limited assessment for adenopathy, no grossly enlarged lymph nodes in the abdomen or pelvis. Reproductive: Stable 2.1 cm left ovarian cyst. Right ovary not definitively seen. Prior hysterectomy. Other: Small volume abdominopelvic ascites, measuring simple fluid density. Generalized mesenteric and intra-abdominal edema. No free air. Postsurgical changes the anterior abdominal wall. Musculoskeletal: There are no acute or suspicious osseous abnormalities. Multilevel degenerative change in the lumbar spine. IMPRESSION: 1. Fluid within the distal esophagus, throughout prominent small bowel, cecum and ascending colon with associated mesenteric edema. Overall findings consistent with generalized enteritis. Wall thickening of the duodenum proximal  jejunum. 2. Mild peripancreatic fat stranding and edema most prominent about the head and uncinate process, can be seen with acute pancreatitis in the appropriate clinical setting. Recommend correlation with pancreatic enzymes. 3. Small volume abdominopelvic ascites. Generalized mesenteric and intra-abdominal edema. Trace pleural effusions/pleural thickening. 4. 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 5. Stable 2.1 cm left ovarian cyst dating back to 2017, likely benign. 6. Additional stable chronic findings as described. Aortic Atherosclerosis (ICD10-I70.0). Electronically Signed   By: Keith Rake M.D.   On: 09/17/2019 21:53   CT Head Wo Contrast  Result Date: 09/13/2019 CLINICAL DATA:  Unresponsive, headache, hypoxia EXAM: CT HEAD WITHOUT CONTRAST TECHNIQUE: Contiguous axial images were obtained from the base of the skull through the vertex without intravenous contrast. COMPARISON:  10/09/2016 FINDINGS: Brain: No acute infarct or hemorrhage. Lateral ventricles and midline structures are stable. 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 No acute extra-axial fluid collections. No mass effect. Vascular: No hyperdense vessel or unexpected calcification. Skull: Normal. Negative for fracture or focal lesion. Sinuses/Orbits: Stable bilateral ethmoidectomies. Remaining sinuses are clear. Other: None. IMPRESSION: No acute intracranial process. Electronically Signed   By: Randa Ngo M.D.   On: 09/19/2019 21:43   CT Cervical Spine Wo Contrast  Result Date: 08/31/2019 CLINICAL DATA:  Unresponsive, hypoxia, dizziness, trauma EXAM: CT CERVICAL SPINE WITHOUT CONTRAST TECHNIQUE: Multidetector CT imaging of the cervical spine was performed without intravenous contrast. Multiplanar CT image reconstructions were also generated. COMPARISON:  None. FINDINGS: Alignment: Alignment is anatomic. Skull base and vertebrae: No acute displaced cervical spine fracture. Soft tissues and spinal canal: No  prevertebral fluid or swelling. No visible canal hematoma. Disc levels: There is extensive multilevel cervical spondylosis and facet hypertrophy, from C4 through T1. Significant neural foraminal encroachment bilaterally at C3/C4 and right predominant at C4/C5 and C5/C6. Upper chest: Airway is patent.  Lung apices are clear. Other: Reconstructed images demonstrate no additional findings. IMPRESSION: 1. Extensive multilevel cervical degenerative changes. No acute fracture. Electronically Signed   By: Diana Eves.D.  On: 09/20/2019 21:42   DG Chest Portable 1 View  Result Date: 09/05/2019 CLINICAL DATA:  Shortness of breath EXAM: PORTABLE CHEST 1 VIEW COMPARISON:  12/03/2018 FINDINGS: Cardiac shadows within normal limits. Lungs are well aerated bilaterally. No focal infiltrate or sizable effusion is seen. No bony abnormality is noted. IMPRESSION: No active disease. Electronically Signed   By: Inez Catalina M.D.   On: 09/01/2019 19:11    ASSESSMENT / PLAN:  Acute Hypoxic Respiratory Failure in setting of metabolic acidosis,  SVT/A.flutter w/ RVR, ? PE, & ? Aspiration (in setting of vomiting hx) -Supplemental O2 as needed to maintain O2 sats greater than 92% -BiPAP as needed -Patient is DNI -Follow intermittent chest x-ray and ABG as needed -CXR on 4/16 without acute process -PRN bronchodilators -IV Zosyn -Heparin gtt -Unable to obtain CTA Chest at this time due to AKI; pt not stable enough for V/Q scan.  -Will obtain bilateral LE [place]  Cardiogenic shock (secondary to rapid HR) + Septic Shock (secondary to Acute Pancreatitis & Gastroenteritis) SVT vs. Atrial Flutter w/ RVR Elevated troponin in setting of demand ischemia vs. NSTEMI Chronic HFpEF, currently without acute exacerbation -Continuous cardiac monitoring -Maintain MAP greater than 65 -Cautious IV fluids given HFpEF and AKI -Neo-Synephrine as needed to maintain map goal -D/c Cardizem infusion due to hypotension -Discussed with Dr.  Madalyn Rob, 6 mg IV adenosine x1 dose without significant improvement in heart rate -Amiodarone bolus and IV drip -Heparin drip for anticoagulation -Trend troponin until downtrending -Trend Lactic acid -Cardiology consulted, appreciate input -2D echocardiogram pending  Severe Sepsis secondary to Acute Pancreatitis & Gastroenteritis -Monitor fever curve -Trend WBCs and procalcitonin -Follow cultures as above -Will obtain GI PCR panel & C-diff if has further diarrhea -Received cefepime, Flagyl, vancomycin in ED; will discontinue and place on IV Zosyn -Chest x-ray without focal infiltrate on 416 -Urinalysis not concerning for UTI on 4/16 -CT abdomen & pelvis 4/16 concerning for gastroenteritis and questionable acute pancreatitis -Consider general surgery consult -Trend Amylase & Lipase -NPO for now -Cautious IV Fluids -Pain control  AKI Anion Gap Metabolic Acidosis -Monitor I&O's / urinary output -Follow BMP -Ensure adequate renal perfusion -Avoid nephrotoxic agents as able -Replace electrolytes as indicated -IV Fluids -Bicarb gtt -Consult Nephrology, appreciate input  Acute Metabolic Encephalopathy secondary to Sepsis, Respiratory Failure>> IMPROVED -CT head and CT cervical spine 4/16 both negative for acute processes -Provide supportive care -Encourage normal sleep-wake cycle -Promote family presence -Treat sepsis, respiratory failure, tachycardia     BEST PRACTICES: DISPOSITION: ICU GOALS OF CARE: Limited Code (NO INTUBATION) VTE PROPHYLAXIS/ANTICOAGULATION: Heparin gtt UPDATES: Updated pt at bedside 4/17 and updated pt's daughter Marcie Bal [facility]) via telephone 09/12/19 CONSULTS: Hospitalist (primary service), Cardiology, Nephrology   Darel Hong, AGACNP-BC [facility] Pager: [phone]  09/12/2019, 2:59 AM

## 2019-09-12 NOTE — Progress Notes (Signed)
ANTICOAGULATION CONSULT NOTE - Initial Consult  Pharmacy Consult for Heparin Indication: A. Flutter and presumed PE   Allergies  Allergen Reactions  . Neomycin Anaphylaxis    blisters  . Lithium   . Meperidine Hcl     REACTION: UNSPECIFIED  . Pravastatin Sodium     Weakness, resolved after stopping medicine 2013  . Systane [Polyethyl Glycol-Propyl Glycol] Rash    Patient Measurements: Height: 5\' 2"  (157.5 cm) Weight: 77.7 kg (171 lb 4.8 oz) IBW/kg (Calculated) : 50.1 HEPARIN DW (KG): 67.1  Vital Signs: Temp: 98.3 F (36.8 C) (04/17 1532) BP: 109/64 (04/17 1900) Pulse Rate: 105 (04/17 1900)  Labs: Recent Labs    09/15/2019 1841 08/31/2019 2051 09/12/19 0446 09/12/19 0703 09/12/19 1005 09/12/19 1154 09/12/19 1440 09/12/19 1733  HGB 16.3*  --  14.2  --   --   --   --   --   HCT 48.0*  --  42.3  --   --   --   --   --   PLT 298  --  234  --   --   --   --   --   APTT 34  --   --   --   --   --   --   --   LABPROT 15.5*  --  21.5*  --   --   --   --   --   INR 1.2  --  1.9*  --   --   --   --   --   HEPARINUNFRC  --   --   --   --   --  0.71*  --   --   CREATININE 3.12*   < > 3.32*  --   --   --  3.74* 3.49*  TROPONINIHS 648*   < >  --  1,262* 1,255*  --   --  1,288*   < > = values in this interval not displayed.    Estimated Creatinine Clearance: 12.8 mL/min (A) (by C-G formula based on SCr of 3.49 mg/dL (H)).   Medical History: Past Medical History:  Diagnosis Date  . Agitation   . Allergic    Immunotherapy  . Allergic rhinitis   . ALLERGIC RHINITIS 10/26/2006   Qualifier: Diagnosis of  By: Council Mechanic MD, Hilaria Ota   . Anemia, iron deficiency   . Asthma   . Barrett's esophagus 01/04/2011  . Bipolar affective disorder ([place])   . BIPOLAR AFFECTIVE DISORDER, DEPRESSED, HX OF 10/25/2006   Qualifier: Diagnosis of  By: Zara Council LPN, Mariann Laster    . Blood in stool    Via stool cards  . Blood transfusion without reported diagnosis   . Breast cancer ([place]) 1973   Bilateral  Mastectomies  . Carpal tunnel syndrome   . Cervical cancer [facility])    Hysterectomy  . Chest pain   . Chronic kidney disease   . 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 Constipation    Mild  . Depressed   . Diarrhea   . DVT (deep venous thrombosis) ([place])   . Fibromyalgia   . Gastric ulcer    Acute  . Gastritis    Barretts, Stricture  . GERD (gastroesophageal reflux disease)   . Gross hematuria 06/23/2015  . History of kidney stones   . Hyperlipidemia   . Hypertension   . HYPERTENSION 10/26/2006   Qualifier: Diagnosis of  By: Council Mechanic MD, Hilaria Ota   . Hypothyroidism   .  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 Ophthalmoplegia    Dr. Nanine Means at [facility]  . Osteoarthritis    Dr. Marveen Reeks Dr. Jefm Bryant  . Osteoarthrosis    Dr. Marveen Reeks  . PAF (paroxysmal atrial fibrillation) ([place])   . Pain 01/2010   Declined by [facility] pain clinic  . Pneumonia    with trach / 2 YOA and 14 YOA  . PONV (postoperative nausea and vomiting)    past hx.-none recent  . Renal artery stenosis ([place]) 01/04/2011   S/p angioplasty of vessels x2   . Rheumatoid arthritis(714.0)    Dr. Jefm Bryant  . Sinusitis, acute 07/09/2012    Medications:  Medications Prior to Admission  Medication Sig Dispense Refill Last Dose  . acetaminophen (TYLENOL) 325 MG tablet Take 650 mg by mouth every 6 (six) hours as needed for mild pain or fever.    Unknown at PRN  . aspirin 81 MG tablet Take 81 mg by mouth daily.   24+ hours at Unknown  . baclofen (LIORESAL) 10 MG tablet Take 5 mg by mouth 2 (two) times daily.    24+ hours at Unknown  . Calcium Carbonate-Vitamin D (CALTRATE 600+D PO) Take 1 tablet by mouth 2 (two) times a day.      . Cholecalciferol (D 5000) 125 MCG (5000 UT) capsule Take 5,000 Units by mouth daily.      . DULoxetine (CYMBALTA) 60 MG capsule Take 60 mg by mouth every morning.    24+ hours at Unknown  . fenofibrate (TRICOR) 145 MG tablet Take 145 mg by mouth daily.   24+ hours at  Unknown  . gabapentin (NEURONTIN) 300 MG capsule Take 300 mg by mouth 3 (three) times daily.    24+ hours at Unknown  . lamoTRIgine (LAMICTAL) 25 MG tablet Take 50 mg by mouth at bedtime.    24+ hours at Unknown  . levothyroxine (SYNTHROID) 25 MCG tablet Take 25 mcg by mouth daily before breakfast.   24+ hours at Unknown  . losartan (COZAAR) 25 MG tablet Take 25 mg by mouth daily.    24+ hours at Unknown  . metoprolol tartrate (LOPRESSOR) 50 MG tablet Take 50 mg by mouth 2 (two) times daily.   24+ hours at Unknown  . Multiple Vitamin (MULTIVITAMIN) tablet Take 1 tablet by mouth daily.       . pantoprazole (PROTONIX) 40 MG tablet Take 40 mg by mouth 2 (two) times daily.    24+ hours at Unknown  . primidone (MYSOLINE) 50 MG tablet Take 50 mg by mouth 2 (two) times a day.    24+ hours at Unknown  . propranolol (INDERAL) 10 MG tablet Take 10 mg by mouth 2 (two) times daily.   24+ hours at Unknown  . vitamin C (ASCORBIC ACID) 250 MG tablet Take 1,000 mg by mouth in the morning and at bedtime.        Assessment: Pharmacy consulted for heparin infusion dosing and monitor for atrial flutter and presumed PE in 79 yo female. Patient is admitted with acute metabolic encephalopathy and sepsis.  Baseline labs ordered. APTT WNL.  No anticoagulants per PTA med list.   4/16 Heparin infusion started @ 1200 units/hr   Goal of Therapy:  Heparin level 0.3-0.7 units/ml Monitor platelets by anticoagulation protocol: Yes   Plan:  4/17 @ 1154 HL 0.71. Level is slightly subtherapeutic.  Will decrease heparin infusion to 1050 units/hr.   Recheck Check HL ~ 6 hours after rate adjustment Continue  to monitor CBC daily per protocol.   4/17:  Spoke with RN ,  Heparin drip was stopped around 1400 for CRRT line placement, heparin drip restarted @ 1900. Will draw HL 8 hrs after restart on 04/18 @ 0300.   Orene Desanctis, PharmD Clinical Pharmacist 09/12/2019 7:43 PM

## 2019-09-12 NOTE — Procedures (Signed)
Central Venous Catheter Placement: Dialysis catheter placement ([facility]) Indication: Patient requiring CRRT for severe metabolic acidosis  Consent: Verbal/written from patient and from daughter.   Hand washing performed prior to starting the procedure.   Procedure:   An initial attempt was performed on the left femoral approach however, the catheter could not be advanced over the guidewire.  This location was then aborted.  The procedure was then moved to the LEFT internal jugular vein.  An active timeout was performed and correct patient, name, & ID confirmed.   Patient was positioned correctly for central venous access.  Patient was prepped using strict sterile technique including chlorohexidine preps, sterile drape, sterile gown and sterile gloves.    The area was prepped, draped and anesthetized in the usual sterile manner. Patient comfort was obtained.    A triple lumen catheter was placed in LEFT internal Jugular Vein There was good blood return, catheter caps were placed on lumens, catheter flushed easily, the line was secured and a sterile dressing and BIO-PATCH applied.   Ultrasound was used to visualize vasculature and guidance of needle.   Patient tolerated procedure well.  Number of Attempts: 1 Complications:none Estimated Blood Loss: Less than 5 mL Chest Radiograph indicated and ordered.  Catheter in good position, no pneumothorax. Operator: Windy Canny. Derrill Kay, MD [facility]

## 2019-09-12 NOTE — Progress Notes (Signed)
This RN went to assess patient this am. Patient having labored breathing, heart rate 130's and stated she was having abdominal pain. Oxygenation reading in the 50-60's. With disorganized wave form. Per Dr Patsey Berthold morphine given, fluid bolus and heated high flow initiated. Dr notified of patients lactic acid, troponin, and liver enzymes. Digoxin given, fentanyl orders placed and morphine discontinued. Antibiotics ordred. Per Dr. She will consult cardiology. Surgery in to evaluate patient, at this point continue to monitor. Will keep patient NPO. Pending lower leg extremity Ultrasound. Per orders started levophed and titrate neo off.

## 2019-09-12 NOTE — Progress Notes (Signed)
Dr Curt Bears notified of patients troponin levels trending up. He will be in to see the patient. No additional orders and continue to monitor.

## 2019-09-12 NOTE — Consult Note (Signed)
Pharmacy Antibiotic Note  SHAILYN HAYENGA is a 79 y.o. female admitted on 09/09/2019 with an intra-abdominal infection.   Pharmacy has been consulted for meropenem dosing.  Scr: 3.32  WBC: 9.5  PCT: 63.48  Lactate: 2.8   Plan: Start meropenem 500mg  BID based on current CrCl 10-52ml/min.  Pharmacy will continue to monitor renal function and adjust dose as needed.    Height: 5\' 2"  (157.5 cm) Weight: 77.7 kg (171 lb 4.8 oz) IBW/kg (Calculated) : 50.1  Temp (24hrs), Avg:98.9 F (37.2 C), Min:98.8 F (37.1 C), Max:99.3 F (37.4 C)  Recent Labs  Lab 08/30/2019 1841 09/09/2019 2051 09/12/19 0446 09/12/19 0703  WBC 15.1*  --  9.5  --   CREATININE 3.12*  --  3.32*  --   LATICACIDVEN 9.8* 4.0* 3.8* 2.8*    Estimated Creatinine Clearance: 13.5 mL/min (A) (by C-G formula based on SCr of 3.32 mg/dL (H)).    Allergies  Allergen Reactions  . Neomycin Anaphylaxis    blisters  . Lithium   . Meperidine Hcl     REACTION: UNSPECIFIED  . Pravastatin Sodium     Weakness, resolved after stopping medicine 2013  . Systane [Polyethyl Glycol-Propyl Glycol] Rash    Antimicrobials this admission: 4/16 Cefepime/flagyl/Zosyn/Vanco >> x 1  4/17 meropenem >>   Microbiology results: 4/16 BCx: negative  4/16 MRSA PCR: negative   Thank you for allowing pharmacy to be a part of this patient's care.  Pernell Dupre, PharmD, BCPS Clinical Pharmacist 09/12/2019 9:08 AM

## 2019-09-13 ENCOUNTER — Encounter: Payer: Self-pay | Admitting: Internal Medicine

## 2019-09-13 ENCOUNTER — Other Ambulatory Visit: Payer: Self-pay

## 2019-09-13 DIAGNOSIS — G9341 Metabolic encephalopathy: Secondary | ICD-10-CM | POA: Diagnosis not present

## 2019-09-13 LAB — HEPARIN LEVEL (UNFRACTIONATED)
Heparin Unfractionated: 0.65 IU/mL (ref 0.30–0.70)
Heparin Unfractionated: 0.21 IU/mL — ABNORMAL LOW (ref 0.30–0.70)
Heparin Unfractionated: 0.12 IU/mL — ABNORMAL LOW (ref 0.30–0.70)
Heparin Unfractionated: 0.11 IU/mL — ABNORMAL LOW (ref 0.30–0.70)

## 2019-09-13 LAB — CBC
HCT: 35.5 % — ABNORMAL LOW (ref 36.0–46.0)
Hemoglobin: 12.3 g/dL (ref 12.0–15.0)
MCH: 32.6 pg (ref 26.0–34.0)
MCHC: 34.6 g/dL (ref 30.0–36.0)
MCV: 94.2 fL (ref 80.0–100.0)
Platelets: 234 10*3/uL (ref 150–400)
RBC: 3.77 MIL/uL — ABNORMAL LOW (ref 3.87–5.11)
RDW: 12.7 % (ref 11.5–15.5)
WBC: 11.5 10*3/uL — ABNORMAL HIGH (ref 4.0–10.5)
nRBC: 0 % (ref 0.0–0.2)

## 2019-09-13 LAB — RENAL FUNCTION PANEL
Albumin: 2.8 g/dL — ABNORMAL LOW (ref 3.5–5.0)
Albumin: 2.5 g/dL — ABNORMAL LOW (ref 3.5–5.0)
Anion gap: 14 (ref 5–15)
Anion gap: 12 (ref 5–15)
BUN: 36 mg/dL — ABNORMAL HIGH (ref 8–23)
BUN: 22 mg/dL (ref 8–23)
CO2: 20 mmol/L — ABNORMAL LOW (ref 22–32)
CO2: 22 mmol/L (ref 22–32)
Calcium: 6.7 mg/dL — ABNORMAL LOW (ref 8.9–10.3)
Calcium: 7.5 mg/dL — ABNORMAL LOW (ref 8.9–10.3)
Chloride: 104 mmol/L (ref 98–111)
Chloride: 104 mmol/L (ref 98–111)
Creatinine, Ser: 1.82 mg/dL — ABNORMAL HIGH (ref 0.44–1.00)
Creatinine, Ser: 1.19 mg/dL — ABNORMAL HIGH (ref 0.44–1.00)
GFR calc Af Amer: 30 mL/min — ABNORMAL LOW (ref >60)
GFR calc Af Amer: 51 mL/min — ABNORMAL LOW (ref >60)
GFR calc non Af Amer: 26 mL/min — ABNORMAL LOW (ref >60)
GFR calc non Af Amer: 44 mL/min — ABNORMAL LOW (ref >60)
Glucose, Bld: 118 mg/dL — ABNORMAL HIGH (ref 70–99)
Glucose, Bld: 119 mg/dL — ABNORMAL HIGH (ref 70–99)
Phosphorus: 3.4 mg/dL (ref 2.5–4.6)
Phosphorus: 2.8 mg/dL (ref 2.5–4.6)
Potassium: 4 mmol/L (ref 3.5–5.1)
Potassium: 4.3 mmol/L (ref 3.5–5.1)
Sodium: 138 mmol/L (ref 135–145)
Sodium: 138 mmol/L (ref 135–145)

## 2019-09-13 LAB — MAGNESIUM: Magnesium: 1.9 mg/dL (ref 1.7–2.4)

## 2019-09-13 LAB — LIPASE, BLOOD: Lipase: 49 U/L (ref 11–51)

## 2019-09-13 LAB — TROPONIN I (HIGH SENSITIVITY)
Troponin I (High Sensitivity): 542 ng/L (ref <18)
Troponin I (High Sensitivity): 381 ng/L (ref <18)

## 2019-09-13 LAB — URINE CULTURE: Culture: NO GROWTH

## 2019-09-13 LAB — AMYLASE: Amylase: 98 U/L (ref 28–100)

## 2019-09-13 LAB — PROCALCITONIN: Procalcitonin: 117.68 ng/mL

## 2019-09-13 LAB — LACTIC ACID, PLASMA: Lactic Acid, Venous: 3.9 mmol/L (ref 0.5–1.9)

## 2019-09-13 MED ORDER — HALOPERIDOL LACTATE 5 MG/ML IJ SOLN
1.0000 mg | Freq: Once | INTRAMUSCULAR | Status: AC
  Administered 2019-09-13: 1 mg via INTRAVENOUS
  Filled 2019-09-13: qty 1

## 2019-09-13 MED ORDER — HALOPERIDOL LACTATE 5 MG/ML IJ SOLN
2.0000 mg | Freq: Once | INTRAMUSCULAR | Status: AC
  Administered 2019-09-13: 2 mg via INTRAVENOUS

## 2019-09-13 MED ORDER — HEPARIN BOLUS VIA INFUSION
2000.0000 [IU] | Freq: Once | INTRAVENOUS | Status: AC
  Administered 2019-09-13: 2000 [IU] via INTRAVENOUS
  Filled 2019-09-13: qty 2000

## 2019-09-13 MED ORDER — LORAZEPAM 2 MG/ML IJ SOLN
0.5000 mg | Freq: Once | INTRAMUSCULAR | Status: AC
  Administered 2019-09-13: 0.5 mg via INTRAVENOUS
  Filled 2019-09-13: qty 1

## 2019-09-13 MED ORDER — LORAZEPAM 2 MG/ML IJ SOLN
0.5000 mg | INTRAMUSCULAR | Status: DC | PRN
  Administered 2019-09-13 – 2019-09-14 (×6): 0.5 mg via INTRAVENOUS
  Filled 2019-09-13 (×6): qty 1

## 2019-09-13 MED ORDER — CALCIUM GLUCONATE-NACL 1-0.675 GM/50ML-% IV SOLN
1.0000 g | Freq: Once | INTRAVENOUS | Status: AC
  Administered 2019-09-13: 1000 mg via INTRAVENOUS
  Filled 2019-09-13: qty 50

## 2019-09-13 MED ORDER — HEPARIN BOLUS VIA INFUSION
1000.0000 [IU] | Freq: Once | INTRAVENOUS | Status: AC
  Administered 2019-09-13: 1000 [IU] via INTRAVENOUS
  Filled 2019-09-13: qty 1000

## 2019-09-13 MED ORDER — HALOPERIDOL LACTATE 5 MG/ML IJ SOLN
1.0000 mg | Freq: Four times a day (QID) | INTRAMUSCULAR | Status: DC | PRN
  Administered 2019-09-13 – 2019-09-14 (×4): 1 mg via INTRAVENOUS
  Filled 2019-09-13 (×5): qty 1

## 2019-09-13 MED ORDER — THIAMINE HCL 100 MG/ML IJ SOLN
100.0000 mg | INTRAMUSCULAR | Status: DC
  Administered 2019-09-13 – 2019-09-17 (×5): 100 mg via INTRAVENOUS
  Filled 2019-09-13 (×5): qty 2

## 2019-09-13 MED ORDER — VANCOMYCIN HCL 750 MG/150ML IV SOLN
750.0000 mg | Freq: Two times a day (BID) | INTRAVENOUS | Status: DC
  Filled 2019-09-13 (×2): qty 150

## 2019-09-13 MED ORDER — VANCOMYCIN VARIABLE DOSE PER UNSTABLE RENAL FUNCTION (PHARMACIST DOSING): Status: DC

## 2019-09-13 MED ORDER — VANCOMYCIN HCL 1750 MG/350ML IV SOLN
1750.0000 mg | Freq: Once | INTRAVENOUS | Status: AC
  Administered 2019-09-13: 1750 mg via INTRAVENOUS
  Filled 2019-09-13: qty 350

## 2019-09-13 MED ORDER — PANTOPRAZOLE SODIUM 40 MG IV SOLR
40.0000 mg | INTRAVENOUS | Status: DC
  Administered 2019-09-13 – 2019-09-17 (×5): 40 mg via INTRAVENOUS
  Filled 2019-09-13 (×5): qty 40

## 2019-09-13 MED ORDER — CALCIUM GLUCONATE-NACL 2-0.675 GM/100ML-% IV SOLN
2.0000 g | Freq: Once | INTRAVENOUS | Status: AC
  Administered 2019-09-13: 2000 mg via INTRAVENOUS
  Filled 2019-09-13: qty 100

## 2019-09-13 NOTE — Progress Notes (Signed)
bipap on standby. Patient remains on HFNC resting well at this time. Will continue to monitor.

## 2019-09-13 NOTE — Progress Notes (Signed)
[facility]  ROUNDING NOTE   Subjective:  Patient remains critically ill. Has received some sedation earlier today in the form of Haldol. Continues on CRRT. Lactic acid a bit higher though serum bicarbonate also improved.   Objective:  Vital signs in last 24 hours:  Temp:  [98.3 F (36.8 C)-99.3 F (37.4 C)] 98.8 F (37.1 C) (04/18 1200) Pulse Rate:  [97-116] 101 (04/18 1300) Resp:  [17-37] 28 (04/18 1300) BP: (68-157)/(40-128) 145/54 (04/18 1200) SpO2:  [93 %-99 %] 94 % (04/18 1300) FiO2 (%):  [70 %] 70 % (04/18 0800) Weight:  [79.3 kg] 79.3 kg (04/18 0316)  Weight change: -1.8 kg Filed Weights   09/12/2019 1833 09/12/19 0539 09/13/19 0316  Weight: 81.1 kg 77.7 kg 79.3 kg    Intake/Output: I/O last 3 completed shifts: In: 8243.2 [I.V.:3845.4; IV Piggyback:4397.8] Out: 3010 [Urine:505; Other:2505]   Intake/Output this shift:  Total I/O In: 511.8 [I.V.:411.7; IV Piggyback:100] Out: 1062 [Urine:95; Other:967]  Physical Exam: General: Critically ill-appearing  Head: Normocephalic, atraumatic. Moist oral mucosal membranes  Eyes: Anicteric  Neck: Supple, trachea midline  Lungs:  Scattered rhonchi, normal effort  Heart: S1S2, tachycardic  Abdomen:  Mild diffuse tenderness, distention noted  Extremities: 2+ peripheral edema.  Neurologic: Lethargic at the moment  Skin: No lesions  Access: Left IJ temporary dialysis catheter    Basic Metabolic Panel: Recent Labs  Lab 09/06/2019 1841 09/20/2019 1841 09/12/19 0446 09/12/19 0446 09/12/19 1005 09/12/19 1440 09/12/19 1733 09/13/19 0319  NA 139  --  137  --   --  135 135 138  K 3.3*  --  3.5  --   --  3.8 4.0 4.0  CL 102  --  104  --   --  102 101 104  CO2 14*  --  18*  --   --  17* 16* 20*  GLUCOSE 173*  --  142*  --   --  133* 136* 118*  BUN 51*  --  57*  --   --  64* 64* 36*  CREATININE 3.12*  --  3.32*  --   --  3.74* 3.49* 1.82*  CALCIUM 8.0*   < > 7.2*   < >  --  6.2* 6.2* 6.7*  MG 2.5*  --   --    --  1.7  --   --  1.9  PHOS  --   --   --   --  3.3  --  5.1* 3.4   < > = values in this interval not displayed.    Liver Function Tests: Recent Labs  Lab 09/08/2019 1841 09/12/19 0446 09/12/19 1733 09/13/19 0319  AST 78* 177*  --   --   ALT 33 58*  --   --   ALKPHOS 56 44  --   --   BILITOT 1.1 1.4*  --   --   PROT 6.5 5.1*  --   --   ALBUMIN 3.5 2.7* 2.7* 2.8*   Recent Labs  Lab 09/07/2019 2051 09/12/19 0446 09/12/19 0703 09/13/19 0319  LIPASE 517*  --  200* 49  AMYLASE  --  320*  --  98   No results for input(s): AMMONIA in the last 168 hours.  CBC: Recent Labs  Lab 09/15/2019 1841 09/12/19 0446 09/13/19 0319  WBC 15.1* 9.5 11.5*  NEUTROABS 12.7*  --   --   HGB 16.3* 14.2 12.3  HCT 48.0* 42.3 35.5*  MCV 96.6 98.4 94.2  PLT 298 234  234    Cardiac Enzymes: No results for input(s): CKTOTAL, CKMB, CKMBINDEX, TROPONINI in the last 168 hours.  BNP: Invalid input(s): POCBNP  CBG: Recent Labs  Lab 09/12/19 0102 09/12/19 1944  GLUCAP 129* 114*    Microbiology: Results for orders placed or performed during the hospital encounter of 09/17/2019  Blood culture (routine x 2)     Status: None (Preliminary result)   Collection Time: 09/22/2019  6:41 PM   Specimen: BLOOD  Result Value Ref Range Status   Specimen Description BLOOD RIGHT ARM  Final   Special Requests   Final    BOTTLES DRAWN AEROBIC AND ANAEROBIC Blood Culture results may not be optimal due to an inadequate volume of blood received in culture bottles   Culture  Setup Time   Final    AEROBIC BOTTLE ONLY GRAM POSITIVE COCCI Organism ID to follow CRITICAL RESULT CALLED TO, READ BACK BY AND VERIFIED WITH: SCOTT HALL ON 09/12/19 AT 2210 [facility] Performed at [facility], [address] [place], [place] [address]    Culture GRAM POSITIVE COCCI  Final   Report Status PENDING  Incomplete  Blood Culture ID Panel (Reflexed)     Status: Abnormal   Collection Time: 09/12/2019  6:41 PM  Result Value Ref  Range Status   Enterococcus species NOT DETECTED NOT DETECTED Final   Listeria monocytogenes NOT DETECTED NOT DETECTED Final   Staphylococcus species DETECTED (A) NOT DETECTED Final    Comment: Methicillin (oxacillin) susceptible coagulase negative staphylococcus. Possible blood culture contaminant (unless isolated from more than one blood culture draw or clinical case suggests pathogenicity). No antibiotic treatment is indicated for blood  culture contaminants. CRITICAL RESULT CALLED TO, READ BACK BY AND VERIFIED WITH: SCOTT HALL ON 09/12/19 AT 2210 [facility]    Staphylococcus aureus (BCID) NOT DETECTED NOT DETECTED Final   Methicillin resistance NOT DETECTED NOT DETECTED Final   Streptococcus species NOT DETECTED NOT DETECTED Final   Streptococcus agalactiae NOT DETECTED NOT DETECTED Final   Streptococcus pneumoniae NOT DETECTED NOT DETECTED Final   Streptococcus pyogenes NOT DETECTED NOT DETECTED Final   Acinetobacter baumannii NOT DETECTED NOT DETECTED Final   Enterobacteriaceae species NOT DETECTED NOT DETECTED Final   Enterobacter cloacae complex NOT DETECTED NOT DETECTED Final   Escherichia coli NOT DETECTED NOT DETECTED Final   Klebsiella oxytoca NOT DETECTED NOT DETECTED Final   Klebsiella pneumoniae NOT DETECTED NOT DETECTED Final   Proteus species NOT DETECTED NOT DETECTED Final   Serratia marcescens NOT DETECTED NOT DETECTED Final   Haemophilus influenzae NOT DETECTED NOT DETECTED Final   Neisseria meningitidis NOT DETECTED NOT DETECTED Final   Pseudomonas aeruginosa NOT DETECTED NOT DETECTED Final   Candida albicans NOT DETECTED NOT DETECTED Final   Candida glabrata NOT DETECTED NOT DETECTED Final   Candida krusei NOT DETECTED NOT DETECTED Final   Candida parapsilosis NOT DETECTED NOT DETECTED Final   Candida tropicalis NOT DETECTED NOT DETECTED Final    Comment: Performed at [facility], [address] [place], [place] [address]  Respiratory Panel by RT PCR (Flu  A&B, Covid) - Nasopharyngeal Swab     Status: None   Collection Time: 09/04/2019  7:14 PM   Specimen: Nasopharyngeal Swab  Result Value Ref Range Status   SARS Coronavirus 2 by RT PCR NEGATIVE NEGATIVE Final    Comment: (NOTE) SARS-CoV-2 target nucleic acids are NOT DETECTED. The SARS-CoV-2 RNA is generally detectable in upper respiratoy specimens during the acute phase of infection. The lowest  concentration of SARS-CoV-2 viral copies this assay can detect is 131 copies/mL. A negative result does not preclude SARS-Cov-2 infection and should not be used as the sole basis for treatment or other patient management decisions. A negative result may occur with  improper specimen collection/handling, submission of specimen other than nasopharyngeal swab, presence of viral mutation(s) within the areas targeted by this assay, and inadequate number of viral copies (<131 copies/mL). A negative result must be combined with clinical observations, patient history, and epidemiological information. The expected result is Negative. Fact Sheet for Patients:  PinkCheek.be Fact Sheet for Healthcare Providers:  GravelBags.it This test is not yet ap proved or cleared by the [place] FDA and  has been authorized for detection and/or diagnosis of SARS-CoV-2 by FDA under an Emergency Use Authorization (EUA). This EUA will remain  in effect (meaning this test can be used) for the duration of the COVID-19 declaration under Section 564(b)(1) of the Act, 21 U.S.C. section 360bbb-3(b)(1), unless the authorization is terminated or revoked sooner.    Influenza A by PCR NEGATIVE NEGATIVE Final   Influenza B by PCR NEGATIVE NEGATIVE Final    Comment: (NOTE) The Xpert Xpress SARS-CoV-2/FLU/RSV assay is intended as an aid in  the diagnosis of influenza from Nasopharyngeal swab specimens and  should not be used as a sole basis for treatment. Nasal washings  and  aspirates are unacceptable for Xpert Xpress SARS-CoV-2/FLU/RSV  testing. Fact Sheet for Patients: PinkCheek.be Fact Sheet for Healthcare Providers: GravelBags.it This test is not yet approved or cleared by the [place] FDA and  has been authorized for detection and/or diagnosis of SARS-CoV-2 by  FDA under an Emergency Use Authorization (EUA). This EUA will remain  in effect (meaning this test can be used) for the duration of the  Covid-19 declaration under Section 564(b)(1) of the Act, 21  U.S.C. section 360bbb-3(b)(1), unless the authorization is  terminated or revoked. Performed at [facility], [address] [place], [place] [address]   Blood culture (routine x 2)     Status: None (Preliminary result)   Collection Time: 09/12/2019  7:35 PM   Specimen: BLOOD  Result Value Ref Range Status   Specimen Description BLOOD RIGHT ANTECUBITAL  Final   Special Requests   Final    BOTTLES DRAWN AEROBIC AND ANAEROBIC Blood Culture results may not be optimal due to an inadequate volume of blood received in culture bottles   Culture   Final    NO GROWTH 2 DAYS Performed at [facility], [address] [place], [place] [address]    Report Status PENDING  Incomplete  Urine culture     Status: None   Collection Time: 09/16/2019 11:51 PM   Specimen: Urine, Random  Result Value Ref Range Status   Specimen Description   Final    URINE, RANDOM Performed at [facility], [address] [place], [place] [address]    Special Requests   Final    NONE Performed at [facility], [address] [place], [place] [address]    Culture   Final    NO GROWTH Performed at [facility], [place] [address] [place], [place] [address]    Report Status 09/13/2019 FINAL  Final  MRSA PCR Screening     Status: None   Collection Time: 09/12/19  1:07 AM   Specimen: Nasopharyngeal  Result Value Ref  Range Status   MRSA by PCR NEGATIVE NEGATIVE Final    Comment:  The GeneXpert MRSA Assay (FDA approved for NASAL specimens only), is one component of a comprehensive MRSA colonization surveillance program. It is not intended to diagnose MRSA infection nor to guide or monitor treatment for MRSA infections. Performed at [facility], [address] [place], [place] [address]     Coagulation Studies: Recent Labs    09/14/2019 1841 09/12/19 0446  LABPROT 15.5* 21.5*  INR 1.2 1.9*    Urinalysis: Recent Labs    09/08/2019 2351  COLORURINE AMBER*  LABSPEC 1.017  PHURINE 5.0  GLUCOSEU >=500*  HGBUR NEGATIVE  BILIRUBINUR NEGATIVE  KETONESUR NEGATIVE  PROTEINUR 100*  NITRITE NEGATIVE  LEUKOCYTESUR NEGATIVE      Imaging: CT ABDOMEN PELVIS WO CONTRAST  Result Date: 09/22/2019 CLINICAL DATA:  Abdominal trauma. Vomiting for 2 days. EXAM: CT ABDOMEN AND PELVIS WITHOUT CONTRAST TECHNIQUE: Multidetector CT imaging of the abdomen and pelvis was performed following the standard protocol without IV contrast. COMPARISON:  CT 06/30/2018 FINDINGS: Lower chest: Dependent atelectasis with minimal pleural effusions/pleural thickening. Calcified granuloma at the right lung base. Hepatobiliary: No evidence of focal hepatic abnormality allowing for lack contrast. Small volume of perihepatic free fluid measures simple fluid density. Post cholecystectomy. No biliary dilatation. Pancreas: Mild peripancreatic fat stranding and edema most prominent about the head and uncinate process. No ductal dilatation. Spleen: Normal in size. No evidence of focal abnormality allowing for lack contrast. Perisplenic free fluid measures simple fluid density. Adrenals/Urinary Tract: No evidence of adrenal nodule or hemorrhage. No hydronephrosis. Bilateral renal parenchymal thinning. There is bilateral perinephric edema, left greater than right. 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 Bladder partially distended, no definite bladder wall thickening. Stomach/Bowel: Bowel evaluation is limited in the absence of IV and enteric contrast. Fluid distends the distal esophagus. Surgical clips at the gastroesophageal junction. Minimal intraluminal fluid within partially distended stomach. There is wall thickening about the duodenum and proximal jejunum. Small bowel is diffusely fluid-filled prominent measuring up to 3 cm in the pelvis. There is no discrete transition point. Appendix is surgically absent. Fluid within the cecum, ascending and transverse colon. Remainder of the colon is nondistended.No evidence of pneumatosis. There is generalized mesenteric edema. Vascular/Lymphatic: Aortic atherosclerosis. No aortic aneurysm. No retroperitoneal fluid. 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 Limited assessment for adenopathy, no grossly enlarged lymph nodes in the abdomen or pelvis. Reproductive: Stable 2.1 cm left ovarian cyst. Right ovary not definitively seen. Prior hysterectomy. Other: Small volume abdominopelvic ascites, measuring simple fluid density. Generalized mesenteric and intra-abdominal edema. No free air. Postsurgical changes the anterior abdominal wall. Musculoskeletal: There are no acute or suspicious osseous abnormalities. Multilevel degenerative change in the lumbar spine. IMPRESSION: 1. Fluid within the distal esophagus, throughout prominent small bowel, cecum and ascending colon with associated mesenteric edema. Overall findings consistent with generalized enteritis. Wall thickening of the duodenum proximal jejunum. 2. Mild peripancreatic fat stranding and edema most prominent about the head and uncinate process, can be seen with acute pancreatitis in the appropriate clinical setting. Recommend  correlation with pancreatic enzymes. 3. Small volume abdominopelvic ascites. Generalized mesenteric and intra-abdominal edema. Trace pleural effusions/pleural thickening. 4. 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 5. Stable 2.1 cm left ovarian cyst dating back to 2017, likely benign. 6. Additional stable chronic findings as described. Aortic Atherosclerosis (ICD10-I70.0). Electronically Signed  By: Keith Rake M.D.   On: 09/14/2019 21:53   CT Head Wo Contrast  Result Date: 09/13/2019 CLINICAL DATA:  Unresponsive, headache, hypoxia EXAM: CT HEAD WITHOUT CONTRAST TECHNIQUE: Contiguous axial images were obtained from the base of the skull through the vertex without intravenous contrast. COMPARISON:  10/09/2016 FINDINGS: Brain: No acute infarct or hemorrhage. Lateral ventricles and midline structures are stable. 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 No acute extra-axial fluid collections. No mass effect. Vascular: No hyperdense vessel or unexpected calcification. Skull: Normal. Negative for fracture or focal lesion. Sinuses/Orbits: Stable bilateral ethmoidectomies. Remaining sinuses are clear. Other: None. IMPRESSION: No acute intracranial process. Electronically Signed   By: Randa Ngo M.D.   On: 09/08/2019 21:43   CT Cervical Spine Wo Contrast  Result Date: 09/16/2019 CLINICAL DATA:  Unresponsive, hypoxia, dizziness, trauma EXAM: CT CERVICAL SPINE WITHOUT CONTRAST TECHNIQUE: Multidetector CT imaging of the cervical spine was performed without intravenous contrast. Multiplanar CT image reconstructions were also generated. COMPARISON:  None. FINDINGS: Alignment: Alignment is anatomic. Skull base and vertebrae: No acute displaced cervical spine fracture. Soft tissues and spinal canal: No prevertebral fluid or swelling. No visible canal hematoma. Disc levels: There is extensive multilevel cervical spondylosis and facet hypertrophy, from C4 through T1. Significant neural foraminal  encroachment bilaterally at C3/C4 and right predominant at C4/C5 and C5/C6. Upper chest: Airway is patent.  Lung apices are clear. Other: Reconstructed images demonstrate no additional findings. IMPRESSION: 1. Extensive multilevel cervical degenerative changes. No acute fracture. Electronically Signed   By: Randa Ngo M.D.   On: 08/29/2019 21:42   US Venous Img Lower Bilateral (DVT)  Result Date: 09/12/2019 CLINICAL DATA:  Lower extremity pain. EXAM: BILATERAL LOWER EXTREMITY VENOUS DOPPLER ULTRASOUND TECHNIQUE: Gray-scale sonography with graded compression, as well as color Doppler and duplex ultrasound were performed to evaluate the lower extremity deep venous systems from the level of the common femoral vein and including the common femoral, femoral, profunda femoral, popliteal and calf veins including the posterior tibial, peroneal and gastrocnemius veins when visible. The superficial great saphenous vein was also interrogated. Spectral Doppler was utilized to evaluate flow at rest and with distal augmentation maneuvers in the common femoral, femoral and popliteal veins. COMPARISON:  None. FINDINGS: RIGHT LOWER EXTREMITY Common Femoral Vein: No evidence of thrombus. Normal compressibility, respiratory phasicity and response to augmentation. Saphenofemoral Junction: No evidence of thrombus. Normal compressibility and flow on color Doppler imaging. Profunda Femoral Vein: No evidence of thrombus. Normal compressibility and flow on color Doppler imaging. Femoral Vein: No evidence of thrombus. Normal compressibility, respiratory phasicity and response to augmentation. Popliteal Vein: No evidence of thrombus. Normal compressibility, respiratory phasicity and response to augmentation. Calf Veins: No evidence of thrombus. Normal compressibility and flow on color Doppler imaging. LEFT LOWER EXTREMITY Common Femoral Vein: No evidence of thrombus. Normal compressibility, respiratory phasicity and response to  augmentation. Saphenofemoral Junction: No evidence of thrombus. Normal compressibility and flow on color Doppler imaging. Profunda Femoral Vein: No evidence of thrombus. Normal compressibility and flow on color Doppler imaging. Femoral Vein: No evidence of thrombus. Normal compressibility, respiratory phasicity and response to augmentation. Popliteal Vein: No evidence of thrombus. Normal compressibility, respiratory phasicity and response to augmentation. Calf Veins: No evidence of thrombus. Normal compressibility and flow on color Doppler imaging. Other Findings:  None. IMPRESSION: No evidence of deep venous thrombosis in either lower extremity. Electronically Signed   By: Markus Daft M.D.   On: 09/12/2019 13:10   DG Chest [facility]  Result Date: 09/12/2019 CLINICAL DATA:  Left central line placement EXAM: PORTABLE CHEST 1 VIEW COMPARISON:  09/12/2019 at 9:14 a.m. FINDINGS: Dual lumen left central line tip: Right atrium. No pneumothorax complicating feature observed. Low lung volumes. Mild bibasilar retro diaphragmatic/retrocardiac densities potentially from atelectasis, pneumonia, or layering effusion. No edema noted. IMPRESSION: 1. Dual lumen left central line tip: Right atrium. No pneumothorax or complicating feature observed. 2. Mild bibasilar retrodiaphragmatic/retrocardiac densities potentially from atelectasis, pneumonia, or layering effusion. Electronically Signed   By: Van Clines M.D.   On: 09/12/2019 17:55   DG Chest Port 1 View  Result Date: 09/12/2019 CLINICAL DATA:  Respiratory failure EXAM: PORTABLE CHEST 1 VIEW COMPARISON:  Chest x-ray dated 09/20/2019. FINDINGS: Heart size and mediastinal contours are within normal limits. Pacer pad overlies the LEFT heart border. Mild atelectasis/scarring at the LEFT lung base. No confluent opacity to suggest a developing pneumonia. No evidence of pulmonary edema. No pleural effusion or pneumothorax is seen. IMPRESSION: No active disease. No  evidence of pneumonia or pulmonary edema. Electronically Signed   By: Franki Cabot M.D.   On: 09/12/2019 11:27   DG Chest Portable 1 View  Result Date: 09/07/2019 CLINICAL DATA:  Shortness of breath EXAM: PORTABLE CHEST 1 VIEW COMPARISON:  12/03/2018 FINDINGS: Cardiac shadows within normal limits. Lungs are well aerated bilaterally. No focal infiltrate or sizable effusion is seen. No bony abnormality is noted. IMPRESSION: No active disease. Electronically Signed   By: Inez Catalina M.D.   On: 09/16/2019 19:11   ECHOCARDIOGRAM COMPLETE  Result Date: 09/12/2019    ECHOCARDIOGRAM REPORT   Patient Name:   Olivia [name] Date of Exam: 09/12/2019 Medical Rec #:  [id number]       Height:       62.0 in Accession #:    [id number]      Weight:       171.3 lb Date of Birth:  1941/05/14        BSA:          1.790 m Patient Age:    79 years        BP:           128/111 mmHg Patient Gender: F               HR:           135 bpm. Exam Location:  [facility] Procedure: 2D Echo and Intracardiac Opacification Agent Indications:     Abnormal ECG  History:         Patient has prior history of Echocardiogram examinations. CAD,                  Arrythmias:Atrial Fibrillation and Atrial Flutter; Risk                  Factors:Hypertension.  Sonographer:     Raliegh Ip Thornton-Maynard Referring Phys:  [id number] Bradly Bienenstock Diagnosing Phys: Ida Rogue MD  Sonographer Comments: TDS due to breast reconstruction. IMPRESSIONS  1. Challenging images. Definity used.  2. Left ventricular ejection fraction, by estimation, is 55 to 60%. The left ventricle has grossly normal function. Left ventricular endocardial border not optimally defined to evaluate regional wall motion. There is moderate left ventricular hypertrophy. Left ventricular diastolic parameters are consistent with Grade I diastolic dysfunction (impaired relaxation).  3. Right ventricular systolic function was not well visualized. The right ventricular size is normal.  4. The mitral valve  was not well visualized.  5. The aortic valve was  not well visualized.  6. Tachycardia noted FINDINGS  Left Ventricle: Left ventricular ejection fraction, by estimation, is 55 to 60%. The left ventricle has normal function. Left ventricular endocardial border not optimally defined to evaluate regional wall motion. Definity contrast agent was given IV to delineate the left ventricular endocardial borders. The left ventricular internal cavity size was normal in size. There is moderate left ventricular hypertrophy. Left ventricular diastolic parameters are consistent with Grade I diastolic dysfunction (impaired relaxation). Right Ventricle: The right ventricular size is normal. No increase in right ventricular wall thickness. Right ventricular systolic function was not well visualized. Left Atrium: Left atrial size was not well visualized. Right Atrium: Right atrial size was not well visualized. Pericardium: The pericardium was not well visualized. Mitral Valve: The mitral valve was not well visualized. Normal mobility of the mitral valve leaflets. No evidence of mitral valve regurgitation. No evidence of mitral valve stenosis. Tricuspid Valve: The tricuspid valve is not well visualized. Tricuspid valve regurgitation is not demonstrated. No evidence of tricuspid stenosis. Aortic Valve: The aortic valve was not well visualized. Aortic valve regurgitation is not visualized. No aortic stenosis is present. Aortic valve peak gradient measures 4.8 mmHg. Pulmonic Valve: The pulmonic valve was not well visualized. Pulmonic valve regurgitation is not visualized. No evidence of pulmonic stenosis. Aorta: The aortic root was not well visualized and the ascending aorta was not well visualized. Venous: The pulmonary veins were not well visualized. The inferior vena cava is normal in size with greater than 50% respiratory variability, suggesting right atrial pressure of 3 mmHg. IAS/Shunts: The interatrial septum was not well  visualized.  LEFT VENTRICLE PLAX 2D LVIDd:         2.63 cm  Diastology LVIDs:         2.20 cm  LV e' lateral:   4.79 cm/s LV PW:         1.36 cm  LV E/e' lateral: 10.9 LV IVS:        1.54 cm  LV e' medial:    3.37 cm/s LVOT diam:     1.70 cm  LV E/e' medial:  15.5 LVOT Area:     2.27 cm  AORTIC VALVE AV Area (Vmax): 1.50 cm AV Vmax:        109.00 cm/s AV Peak Grad:   4.8 mmHg LVOT Vmax:      71.80 cm/s MV E velocity: 52.40 cm/s MV A velocity: 63.50 cm/s  SHUNTS MV E/A ratio:  0.83        Systemic Diam: 1.70 cm Ida Rogue MD Electronically signed by Ida Rogue MD Signature Date/Time: 09/12/2019/3:04:27 PM    Final      Medications:   .  prismasol BGK 4/2.5 500 mL/hr at 09/13/19 1401  .  prismasol BGK 4/2.5 500 mL/hr at 09/13/19 1401  . sodium chloride 250 mL (09/12/19 0430)  . esmolol 25 mcg/kg/min (09/13/19 1400)  . heparin 1,150 Units/hr (09/13/19 1400)  . meropenem (MERREM) IV 1 g (09/13/19 1345)  . norepinephrine (LEVOPHED) Adult infusion 30 mcg/min (09/13/19 1400)  . prismasol BGK 4/2.5 2,000 mL/hr at 09/13/19 1401  . vasopressin (PITRESSIN) infusion - *FOR SHOCK* 0.03 Units/min (09/13/19 1400)   . Chlorhexidine Gluconate Cloth  6 each Topical Daily  . hydrocortisone sod succinate (SOLU-CORTEF) inj  100 mg Intravenous Q8H  . thiamine injection  100 mg Intravenous Q24H   acetaminophen **OR** acetaminophen, fentaNYL (SUBLIMAZE) injection, heparin, ondansetron **OR** ondansetron (ZOFRAN) IV  Assessment/ Plan:  79 y.o. female past  history of degenerative disc disease, breast cancer, cervical cancer status post hysterectomy, depression, GERD, hyperlipidemia, hypertension, hypothyroidism, osteophytes, urinary incontinence admitted with nausea, vomiting, diarrhea and found to have generalized enteritis along with pancreatitis.  1.  Acute kidney injury secondary to acute tubular necrosis/chronic kidney disease stage IIIa baseline EGFR 59.  Patient appears to have severe acute kidney  injury now likely related to intraabdominal process and hypotension.  -Urine output was only 455 cc over the preceding 24 hours.  She continues on CRRT at this time.  She was +2 L for the admission therefore ultrafiltration target was changed to 150 cc/h until this 2 L was removed and then the patient will be kept net even.  Renal parameters have improved with CRRT.  2.  Hypotension.  Continue pressors to maintain a map of 65 or greater.  3.  Metabolic acidosis.  Lactic acid has risen to 3.9.  Likely related to intra-abdominal process as well as hypotension.  Continue CRRT.   LOS: 2 Olivia [name] 4/18/20213:06 PM

## 2019-09-13 NOTE — Progress Notes (Signed)
Progress Note  Patient Name: Olivia [name] Date of Encounter: 09/13/2019  Primary Cardiologist: Nelva Bush, MD   Subjective   Asleep overnight.  Is currently confused and delirious.  Did receive Ativan and is resting.  Per nephrology, has started CRRT.     Inpatient Medications    Scheduled Meds: . Chlorhexidine Gluconate Cloth  6 each Topical Daily  . hydrocortisone sod succinate (SOLU-CORTEF) inj  100 mg Intravenous Q8H  . thiamine injection  100 mg Intravenous Q24H   Continuous Infusions: .  prismasol BGK 4/2.5 500 mL/hr at 09/13/19 0445  .  prismasol BGK 4/2.5 500 mL/hr at 09/13/19 0445  . sodium chloride 250 mL (09/12/19 0430)  . esmolol 25 mcg/kg/min (09/13/19 1200)  . heparin 1,050 Units/hr (09/13/19 1200)  . meropenem (MERREM) IV Stopped (09/13/19 0536)  . norepinephrine (LEVOPHED) Adult infusion 30 mcg/min (09/13/19 1200)  . prismasol BGK 4/2.5 2,000 mL/hr at 09/13/19 1137  . vasopressin (PITRESSIN) infusion - *FOR SHOCK* 0.03 Units/min (09/13/19 1200)   PRN Meds: acetaminophen **OR** acetaminophen, fentaNYL (SUBLIMAZE) injection, heparin, ondansetron **OR** ondansetron (ZOFRAN) IV   Vital Signs    Vitals:   09/13/19 0900 09/13/19 1000 09/13/19 1100 09/13/19 1200  BP: (!) 156/117 (!) 112/40 (!) 153/46 (!) 145/54  Pulse: (!) 114 (!) 102 (!) 101 97  Resp: (!) 37 (!) 28 (!) 25 (!) 22  Temp:    98.8 F (37.1 C)  TempSrc:    Axillary  SpO2: 95% 97% 93% 95%  Weight:      Height:        Intake/Output Summary (Last 24 hours) at 09/13/2019 1259 Last data filed at 09/13/2019 1200 Gross per 24 hour  Intake 5339.86 ml  Output 3627 ml  Net 1712.86 ml   Last 3 Weights 09/13/2019 09/12/2019 09/09/2019  Weight (lbs) 174 lb 13.2 oz 171 lb 4.8 oz 178 lb 12.7 oz  Weight (kg) 79.3 kg 77.7 kg 81.1 kg      Telemetry    Sinus tachycardia- Personally Reviewed  ECG     Physical Exam   GEN: No acute distress.   Neck: No JVD Cardiac:  Tachycardic, no murmurs,  rubs, or gallops.  Respiratory: Clear to auscultation bilaterally. GI: Soft, nontender, non-distended  MS: No edema; No deformity. Neuro:  Nonfocal  Psych: Confused  Labs    High Sensitivity Troponin:   Recent Labs  Lab 09/25/2019 2051 09/12/19 0703 09/12/19 1005 09/12/19 1733 09/13/19 0815  TROPONINIHS 812* 1,262* 1,255* 1,288* 542*      Chemistry Recent Labs  Lab 09/16/2019 1841 09/10/2019 1841 09/12/19 0446 09/12/19 0446 09/12/19 1440 09/12/19 1733 09/13/19 0319  NA 139   < > 137   < > 135 135 138  K 3.3*   < > 3.5   < > 3.8 4.0 4.0  CL 102   < > 104   < > 102 101 104  CO2 14*   < > 18*   < > 17* 16* 20*  GLUCOSE 173*   < > 142*   < > 133* 136* 118*  BUN 51*   < > 57*   < > 64* 64* 36*  CREATININE 3.12*   < > 3.32*   < > 3.74* 3.49* 1.82*  CALCIUM 8.0*   < > 7.2*   < > 6.2* 6.2* 6.7*  PROT 6.5  --  5.1*  --   --   --   --   ALBUMIN 3.5   < > 2.7*  --   --  2.7* 2.8*  AST 78*  --  177*  --   --   --   --   ALT 33  --  58*  --   --   --   --   ALKPHOS 56  --  44  --   --   --   --   BILITOT 1.1  --  1.4*  --   --   --   --   GFRNONAA 14*   < > 13*   < > 11* 12* 26*  GFRAA 16*   < > 15*   < > 13* 14* 30*  ANIONGAP 23*   < > 15   < > 16* 18* 14   < > = values in this interval not displayed.     Hematology Recent Labs  Lab 09/08/2019 1841 09/12/19 0446 09/13/19 0319  WBC 15.1* 9.5 11.5*  RBC 4.97 4.30 3.77*  HGB 16.3* 14.2 12.3  HCT 48.0* 42.3 35.5*  MCV 96.6 98.4 94.2  MCH 32.8 33.0 32.6  MCHC 34.0 33.6 34.6  RDW 12.8 12.9 12.7  PLT 298 234 234    BNP Recent Labs  Lab 09/05/2019 1841  BNP 379.0*     DDimer No results for input(s): DDIMER in the last 168 hours.   Radiology    CT ABDOMEN PELVIS WO CONTRAST  Result Date: 09/21/2019 CLINICAL DATA:  Abdominal trauma. Vomiting for 2 days. EXAM: CT ABDOMEN AND PELVIS WITHOUT CONTRAST TECHNIQUE: Multidetector CT imaging of the abdomen and pelvis was performed following the standard protocol without IV  contrast. COMPARISON:  CT 06/30/2018 FINDINGS: Lower chest: Dependent atelectasis with minimal pleural effusions/pleural thickening. Calcified granuloma at the right lung base. Hepatobiliary: No evidence of focal hepatic abnormality allowing for lack contrast. Small volume of perihepatic free fluid measures simple fluid density. Post cholecystectomy. No biliary dilatation. Pancreas: Mild peripancreatic fat stranding and edema most prominent about the head and uncinate process. No ductal dilatation. Spleen: Normal in size. No evidence of focal abnormality allowing for lack contrast. Perisplenic free fluid measures simple fluid density. Adrenals/Urinary Tract: No evidence of adrenal nodule or hemorrhage. No hydronephrosis. Bilateral renal parenchymal thinning. There is bilateral perinephric edema, left greater than right. 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 Bladder partially distended, no definite bladder wall thickening. Stomach/Bowel: Bowel evaluation is limited in the absence of IV and enteric contrast. Fluid distends the distal esophagus. Surgical clips at the gastroesophageal junction. Minimal intraluminal fluid within partially distended stomach. There is wall thickening about the duodenum and proximal jejunum. Small bowel is diffusely fluid-filled prominent measuring up to 3 cm in the pelvis. There is no discrete transition point. Appendix is surgically absent. Fluid within the cecum, ascending and transverse colon. Remainder of the colon is nondistended.No evidence of pneumatosis. There is generalized mesenteric edema. Vascular/Lymphatic: Aortic atherosclerosis. No aortic aneurysm. No retroperitoneal fluid. 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 Limited assessment for adenopathy, no grossly  enlarged lymph nodes in the abdomen or pelvis. Reproductive: Stable 2.1 cm left ovarian cyst. Right ovary not definitively seen. Prior hysterectomy. Other: Small volume abdominopelvic ascites, measuring simple fluid density. Generalized mesenteric and intra-abdominal edema. No free air. Postsurgical changes the anterior abdominal wall. Musculoskeletal: There are no acute or suspicious osseous abnormalities. Multilevel degenerative change in the lumbar spine. IMPRESSION: 1. Fluid  within the distal esophagus, throughout prominent small bowel, cecum and ascending colon with associated mesenteric edema. Overall findings consistent with generalized enteritis. Wall thickening of the duodenum proximal jejunum. 2. Mild peripancreatic fat stranding and edema most prominent about the head and uncinate process, can be seen with acute pancreatitis in the appropriate clinical setting. Recommend correlation with pancreatic enzymes. 3. Small volume abdominopelvic ascites. Generalized mesenteric and intra-abdominal edema. Trace pleural effusions/pleural thickening. 4. 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 5. Stable 2.1 cm left ovarian cyst dating back to 2017, likely benign. 6. Additional stable chronic findings as described. Aortic Atherosclerosis (ICD10-I70.0). Electronically Signed   By: Keith Rake M.D.   On: 09/24/2019 21:53   CT Head Wo Contrast  Result Date: 09/10/2019 CLINICAL DATA:  Unresponsive, headache, hypoxia EXAM: CT HEAD WITHOUT CONTRAST TECHNIQUE: Contiguous axial images were obtained from the base of the skull through the vertex without intravenous contrast. COMPARISON:  10/09/2016 FINDINGS: Brain: No acute infarct or hemorrhage. Lateral ventricles and midline structures are stable. 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 No acute extra-axial fluid collections. No mass effect. Vascular: No hyperdense vessel or unexpected calcification. Skull: Normal. Negative for fracture or focal  lesion. Sinuses/Orbits: Stable bilateral ethmoidectomies. Remaining sinuses are clear. Other: None. IMPRESSION: No acute intracranial process. Electronically Signed   By: Randa Ngo M.D.   On: 09/16/2019 21:43   CT Cervical Spine Wo Contrast  Result Date: 09/20/2019 CLINICAL DATA:  Unresponsive, hypoxia, dizziness, trauma EXAM: CT CERVICAL SPINE WITHOUT CONTRAST TECHNIQUE: Multidetector CT imaging of the cervical spine was performed without intravenous contrast. Multiplanar CT image reconstructions were also generated. COMPARISON:  None. FINDINGS: Alignment: Alignment is anatomic. Skull base and vertebrae: No acute displaced cervical spine fracture. Soft tissues and spinal canal: No prevertebral fluid or swelling. No visible canal hematoma. Disc levels: There is extensive multilevel cervical spondylosis and facet hypertrophy, from C4 through T1. Significant neural foraminal encroachment bilaterally at C3/C4 and right predominant at C4/C5 and C5/C6. Upper chest: Airway is patent.  Lung apices are clear. Other: Reconstructed images demonstrate no additional findings. IMPRESSION: 1. Extensive multilevel cervical degenerative changes. No acute fracture. Electronically Signed   By: Randa Ngo M.D.   On: 09/15/2019 21:42   US Venous Img Lower Bilateral (DVT)  Result Date: 09/12/2019 CLINICAL DATA:  Lower extremity pain. EXAM: BILATERAL LOWER EXTREMITY VENOUS DOPPLER ULTRASOUND TECHNIQUE: Gray-scale sonography with graded compression, as well as color Doppler and duplex ultrasound were performed to evaluate the lower extremity deep venous systems from the level of the common femoral vein and including the common femoral, femoral, profunda femoral, popliteal and calf veins including the posterior tibial, peroneal and gastrocnemius veins when visible. The superficial great saphenous vein was also interrogated. Spectral Doppler was utilized to evaluate flow at rest and with distal augmentation maneuvers in the  common femoral, femoral and popliteal veins. COMPARISON:  None. FINDINGS: RIGHT LOWER EXTREMITY Common Femoral Vein: No evidence of thrombus. Normal compressibility, respiratory phasicity and response to augmentation. Saphenofemoral Junction: No evidence of thrombus. Normal compressibility and flow on color Doppler imaging. Profunda Femoral Vein: No evidence of thrombus. Normal compressibility and flow on color Doppler imaging. Femoral Vein: No evidence of thrombus. Normal compressibility, respiratory phasicity and response to augmentation. Popliteal Vein: No evidence of thrombus. Normal compressibility, respiratory phasicity and response to augmentation. Calf Veins: No evidence of thrombus. Normal compressibility and flow on color Doppler imaging. LEFT LOWER EXTREMITY Common Femoral Vein: No evidence of thrombus. Normal compressibility, respiratory phasicity and response  to augmentation. Saphenofemoral Junction: No evidence of thrombus. Normal compressibility and flow on color Doppler imaging. Profunda Femoral Vein: No evidence of thrombus. Normal compressibility and flow on color Doppler imaging. Femoral Vein: No evidence of thrombus. Normal compressibility, respiratory phasicity and response to augmentation. Popliteal Vein: No evidence of thrombus. Normal compressibility, respiratory phasicity and response to augmentation. Calf Veins: No evidence of thrombus. Normal compressibility and flow on color Doppler imaging. Other Findings:  None. IMPRESSION: No evidence of deep venous thrombosis in either lower extremity. Electronically Signed   By: Markus Daft M.D.   On: 09/12/2019 13:10   DG Chest Port 1 View  Result Date: 09/12/2019 CLINICAL DATA:  Left central line placement EXAM: PORTABLE CHEST 1 VIEW COMPARISON:  09/12/2019 at 9:14 a.m. FINDINGS: Dual lumen left central line tip: Right atrium. No pneumothorax complicating feature observed. Low lung volumes. Mild bibasilar retro diaphragmatic/retrocardiac  densities potentially from atelectasis, pneumonia, or layering effusion. No edema noted. IMPRESSION: 1. Dual lumen left central line tip: Right atrium. No pneumothorax or complicating feature observed. 2. Mild bibasilar retrodiaphragmatic/retrocardiac densities potentially from atelectasis, pneumonia, or layering effusion. Electronically Signed   By: Van Clines M.D.   On: 09/12/2019 17:55   DG Chest Port 1 View  Result Date: 09/12/2019 CLINICAL DATA:  Respiratory failure EXAM: PORTABLE CHEST 1 VIEW COMPARISON:  Chest x-ray dated 09/24/2019. FINDINGS: Heart size and mediastinal contours are within normal limits. Pacer pad overlies the LEFT heart border. Mild atelectasis/scarring at the LEFT lung base. No confluent opacity to suggest a developing pneumonia. No evidence of pulmonary edema. No pleural effusion or pneumothorax is seen. IMPRESSION: No active disease. No evidence of pneumonia or pulmonary edema. Electronically Signed   By: Franki Cabot M.D.   On: 09/12/2019 11:27   DG Chest Portable 1 View  Result Date: 09/14/2019 CLINICAL DATA:  Shortness of breath EXAM: PORTABLE CHEST 1 VIEW COMPARISON:  12/03/2018 FINDINGS: Cardiac shadows within normal limits. Lungs are well aerated bilaterally. No focal infiltrate or sizable effusion is seen. No bony abnormality is noted. IMPRESSION: No active disease. Electronically Signed   By: Inez Catalina M.D.   On: 09/15/2019 19:11   ECHOCARDIOGRAM COMPLETE  Result Date: 09/12/2019    ECHOCARDIOGRAM REPORT   Patient Name:   RYLI DEPETRO Date of Exam: 09/12/2019 Medical Rec #:  [id number]       Height:       62.0 in Accession #:    [id number]      Weight:       171.3 lb Date of Birth:  Nov 25, 1940        BSA:          1.790 m Patient Age:    79 years        BP:           128/111 mmHg Patient Gender: F               HR:           135 bpm. Exam Location:  [facility] Procedure: 2D Echo and Intracardiac Opacification Agent Indications:     Abnormal ECG  History:          Patient has prior history of Echocardiogram examinations. CAD,                  Arrythmias:Atrial Fibrillation and Atrial Flutter; Risk                  Factors:Hypertension.  Sonographer:  K Thornton-Maynard Referring Phys:  [id number] Bradly Bienenstock Diagnosing Phys: Ida Rogue MD  Sonographer Comments: TDS due to breast reconstruction. IMPRESSIONS  1. Challenging images. Definity used.  2. Left ventricular ejection fraction, by estimation, is 55 to 60%. The left ventricle has grossly normal function. Left ventricular endocardial border not optimally defined to evaluate regional wall motion. There is moderate left ventricular hypertrophy. Left ventricular diastolic parameters are consistent with Grade I diastolic dysfunction (impaired relaxation).  3. Right ventricular systolic function was not well visualized. The right ventricular size is normal.  4. The mitral valve was not well visualized.  5. The aortic valve was not well visualized.  6. Tachycardia noted FINDINGS  Left Ventricle: Left ventricular ejection fraction, by estimation, is 55 to 60%. The left ventricle has normal function. Left ventricular endocardial border not optimally defined to evaluate regional wall motion. Definity contrast agent was given IV to delineate the left ventricular endocardial borders. The left ventricular internal cavity size was normal in size. There is moderate left ventricular hypertrophy. Left ventricular diastolic parameters are consistent with Grade I diastolic dysfunction (impaired relaxation). Right Ventricle: The right ventricular size is normal. No increase in right ventricular wall thickness. Right ventricular systolic function was not well visualized. Left Atrium: Left atrial size was not well visualized. Right Atrium: Right atrial size was not well visualized. Pericardium: The pericardium was not well visualized. Mitral Valve: The mitral valve was not well visualized. Normal mobility of the mitral valve  leaflets. No evidence of mitral valve regurgitation. No evidence of mitral valve stenosis. Tricuspid Valve: The tricuspid valve is not well visualized. Tricuspid valve regurgitation is not demonstrated. No evidence of tricuspid stenosis. Aortic Valve: The aortic valve was not well visualized. Aortic valve regurgitation is not visualized. No aortic stenosis is present. Aortic valve peak gradient measures 4.8 mmHg. Pulmonic Valve: The pulmonic valve was not well visualized. Pulmonic valve regurgitation is not visualized. No evidence of pulmonic stenosis. Aorta: The aortic root was not well visualized and the ascending aorta was not well visualized. Venous: The pulmonary veins were not well visualized. The inferior vena cava is normal in size with greater than 50% respiratory variability, suggesting right atrial pressure of 3 mmHg. IAS/Shunts: The interatrial septum was not well visualized.  LEFT VENTRICLE PLAX 2D LVIDd:         2.63 cm  Diastology LVIDs:         2.20 cm  LV e' lateral:   4.79 cm/s LV PW:         1.36 cm  LV E/e' lateral: 10.9 LV IVS:        1.54 cm  LV e' medial:    3.37 cm/s LVOT diam:     1.70 cm  LV E/e' medial:  15.5 LVOT Area:     2.27 cm  AORTIC VALVE AV Area (Vmax): 1.50 cm AV Vmax:        109.00 cm/s AV Peak Grad:   4.8 mmHg LVOT Vmax:      71.80 cm/s MV E velocity: 52.40 cm/s MV A velocity: 63.50 cm/s  SHUNTS MV E/A ratio:  0.83        Systemic Diam: 1.70 cm Ida Rogue MD Electronically signed by Ida Rogue MD Signature Date/Time: 09/12/2019/3:04:27 PM    Final     Cardiac Studies   TTE 09/12/19 1. Challenging images. Definity used.  2. Left ventricular ejection fraction, by estimation, is 55 to 60%. The  left ventricle has grossly normal function. Left  ventricular endocardial  border not optimally defined to evaluate regional wall motion. There is  moderate left ventricular  hypertrophy. Left ventricular diastolic parameters are consistent with  Grade I diastolic  dysfunction (impaired relaxation).  3. Right ventricular systolic function was not well visualized. The right  ventricular size is normal.  4. The mitral valve was not well visualized.  5. The aortic valve was not well visualized.  6. Tachycardia noted   Patient Profile     79 y.o. female who presented to the hospital with abdominal pain was found to have pancreatitis now on CRRT for acute renal failure  Assessment & Plan    1.  Elevated troponin: Likely due to demand ischemia.  Has had a catheterization that shows no evidence of coronary artery disease.  We Jannette Cotham continue to monitor his troponin is coming down.  2.  Sinus tachycardia: In review of ECGs and current telemetry, it appears that this could have also been sinus tachycardia from her acute illness.  She is in sinus tachycardia today.  We Avira Tillison continue amiodarone for rate control as well as heparin.  She may be able to come off of both of these and may not need long-term anticoagulation.    3.  Acute renal failure: Nephrology consulted.  Is now on CRRT.  4.  Severe sepsis due to acute pancreatitis and gastroenteritis: Plan per critical care.  For questions or updates, please contact [facility] Please consult [email or web address] for contact info under        Signed, Wise Fees Meredith Leeds, MD  09/13/2019, 12:59 PM

## 2019-09-13 NOTE — Consult Note (Signed)
PHARMACY CONSULT NOTE - FOLLOW UP  Pharmacy Consult for Electrolyte Monitoring and Replacement   Recent Labs: Potassium (mmol/L)  Date Value  09/13/2019 4.0  08/29/2012 3.9   Magnesium (mg/dL)  Date Value  09/13/2019 1.9   Calcium (mg/dL)  Date Value  09/13/2019 6.7 (L)   Calcium, Total (mg/dL)  Date Value  08/29/2012 8.9   Albumin (g/dL)  Date Value  09/13/2019 2.8 (L)  08/29/2012 4.0   Phosphorus (mg/dL)  Date Value  09/13/2019 3.4   Sodium (mmol/L)  Date Value  09/13/2019 138  08/29/2012 142   Corrected Calcium: 7.66   Assessment: Pharmacy consulted for electrolyte monitoring and replacement for 79 yo female admitted with AKI. Patient initiated on CRRT 4/17.   Goal of Therapy:  Electrolytes WNL   Plan:  Will order Calcium gluconate 2g IV x 1 dose.  No additional replacement warranted at this time.  Next labs ordered @ 1600.   Will F/U with next labs and continue to replace electrolytes as needed.    Pernell Dupre, PharmD, BCPS Clinical Pharmacist 09/13/2019 9:38 AM

## 2019-09-13 NOTE — Consult Note (Signed)
MEDICATION RELATED CONSULT NOTE - FOLLOW UP   Pharmacy Consult for Review Meds DAILY for adjustment due to CRRT  Allergies  Allergen Reactions  . Neomycin Anaphylaxis    blisters  . Lithium   . Meperidine Hcl     REACTION: UNSPECIFIED  . Pravastatin Sodium     Weakness, resolved after stopping medicine 2013  . Systane [Polyethyl Glycol-Propyl Glycol] Rash    Patient Measurements: Height: 5\' 2"  (157.5 cm) Weight: 79.3 kg (174 lb 13.2 oz) IBW/kg (Calculated) : 50.1  Vital Signs: Temp: 99.3 F (37.4 C) (04/18 0800) Temp Source: Oral (04/18 0800) BP: 156/117 (04/18 0900) Pulse Rate: 114 (04/18 0900) Intake/Output from previous day: 04/17 0701 - 04/18 0700 In: 4949.4 [I.V.:2934.8; IV Piggyback:2014.6] Out: 2960 [Urine:455] Intake/Output from this shift: Total I/O In: 123 [I.V.:123] Out: 258 [Urine:40; Other:218]  Labs: Recent Labs    09/24/2019 1841 08/28/2019 1841 09/12/19 0446 09/12/19 0446 09/12/19 1005 09/12/19 1440 09/12/19 1733 09/13/19 0319  WBC 15.1*  --  9.5  --   --   --   --  11.5*  HGB 16.3*  --  14.2  --   --   --   --  12.3  HCT 48.0*  --  42.3  --   --   --   --  35.5*  PLT 298  --  234  --   --   --   --  234  APTT 34  --   --   --   --   --   --   --   CREATININE 3.12*   < > 3.32*   < >  --  3.74* 3.49* 1.82*  MG 2.5*  --   --   --  1.7  --   --  1.9  PHOS  --   --   --   --  3.3  --  5.1* 3.4  ALBUMIN 3.5   < > 2.7*  --   --   --  2.7* 2.8*  PROT 6.5  --  5.1*  --   --   --   --   --   AST 78*  --  177*  --   --   --   --   --   ALT 33  --  58*  --   --   --   --   --   ALKPHOS 56  --  44  --   --   --   --   --   BILITOT 1.1  --  1.4*  --   --   --   --   --    < > = values in this interval not displayed.   Estimated Creatinine Clearance: 24.9 mL/min (A) (by C-G formula based on SCr of 1.82 mg/dL (H)).   Microbiology: Recent Results (from the past 720 hour(s))  Blood culture (routine x 2)     Status: None (Preliminary result)   Collection  Time: 09/17/2019  6:41 PM   Specimen: BLOOD  Result Value Ref Range Status   Specimen Description BLOOD RIGHT ARM  Final   Special Requests   Final    BOTTLES DRAWN AEROBIC AND ANAEROBIC Blood Culture results may not be optimal due to an inadequate volume of blood received in culture bottles   Culture  Setup Time   Final    AEROBIC BOTTLE ONLY GRAM POSITIVE COCCI Organism ID to follow CRITICAL RESULT CALLED TO, READ BACK BY  AND VERIFIED WITH: SCOTT HALL ON 09/12/19 AT 2210 [facility] Performed at [facility], [address] [place], [place] [address]    Culture [facility] POSITIVE COCCI  Final   Report Status PENDING  Incomplete  Blood Culture ID Panel (Reflexed)     Status: Abnormal   Collection Time: 09/21/2019  6:41 PM  Result Value Ref Range Status   Enterococcus species NOT DETECTED NOT DETECTED Final   Listeria monocytogenes NOT DETECTED NOT DETECTED Final   Staphylococcus species DETECTED (A) NOT DETECTED Final    Comment: Methicillin (oxacillin) susceptible coagulase negative staphylococcus. Possible blood culture contaminant (unless isolated from more than one blood culture draw or clinical case suggests pathogenicity). No antibiotic treatment is indicated for blood  culture contaminants. CRITICAL RESULT CALLED TO, READ BACK BY AND VERIFIED WITH: SCOTT HALL ON 09/12/19 AT 2210 [address]    Staphylococcus aureus (BCID) NOT DETECTED NOT DETECTED Final   Methicillin resistance NOT DETECTED NOT DETECTED Final   Streptococcus species NOT DETECTED NOT DETECTED Final   Streptococcus agalactiae NOT DETECTED NOT DETECTED Final   Streptococcus pneumoniae NOT DETECTED NOT DETECTED Final   Streptococcus pyogenes NOT DETECTED NOT DETECTED Final   Acinetobacter baumannii NOT DETECTED NOT DETECTED Final   Enterobacteriaceae species NOT DETECTED NOT DETECTED Final   Enterobacter cloacae complex NOT DETECTED NOT DETECTED Final   Escherichia coli NOT DETECTED NOT DETECTED Final   Klebsiella oxytoca NOT  DETECTED NOT DETECTED Final   Klebsiella pneumoniae NOT DETECTED NOT DETECTED Final   Proteus species NOT DETECTED NOT DETECTED Final   Serratia marcescens NOT DETECTED NOT DETECTED Final   Haemophilus influenzae NOT DETECTED NOT DETECTED Final   Neisseria meningitidis NOT DETECTED NOT DETECTED Final   Pseudomonas aeruginosa NOT DETECTED NOT DETECTED Final   Candida albicans NOT DETECTED NOT DETECTED Final   Candida glabrata NOT DETECTED NOT DETECTED Final   Candida krusei NOT DETECTED NOT DETECTED Final   Candida parapsilosis NOT DETECTED NOT DETECTED Final   Candida tropicalis NOT DETECTED NOT DETECTED Final    Comment: Performed at [facility], [address] [place], [place] [address]  Respiratory Panel by RT PCR (Flu A&B, Covid) - Nasopharyngeal Swab     Status: None   Collection Time: 09/01/2019  7:14 PM   Specimen: Nasopharyngeal Swab  Result Value Ref Range Status   SARS Coronavirus 2 by RT PCR NEGATIVE NEGATIVE Final    Comment: (NOTE) SARS-CoV-2 target nucleic acids are NOT DETECTED. The SARS-CoV-2 RNA is generally detectable in upper respiratoy specimens during the acute phase of infection. The lowest concentration of SARS-CoV-2 viral copies this assay can detect is 131 copies/mL. A negative result does not preclude SARS-Cov-2 infection and should not be used as the sole basis for treatment or other patient management decisions. A negative result may occur with  improper specimen collection/handling, submission of specimen other than nasopharyngeal swab, presence of viral mutation(s) within the areas targeted by this assay, and inadequate number of viral copies (<131 copies/mL). A negative result must be combined with clinical observations, patient history, and epidemiological information. The expected result is Negative. Fact Sheet for Patients:  PinkCheek.be Fact Sheet for Healthcare Providers:   GravelBags.it This test is not yet ap proved or cleared by the [place] FDA and  has been authorized for detection and/or diagnosis of SARS-CoV-2 by FDA under an Emergency Use Authorization (EUA). This EUA will remain  in effect (meaning this test can be used) for the duration of the COVID-19 declaration  under Section 564(b)(1) of the Act, 21 U.S.C. section 360bbb-3(b)(1), unless the authorization is terminated or revoked sooner.    Influenza A by PCR NEGATIVE NEGATIVE Final   Influenza B by PCR NEGATIVE NEGATIVE Final    Comment: (NOTE) The Xpert Xpress SARS-CoV-2/FLU/RSV assay is intended as an aid in  the diagnosis of influenza from Nasopharyngeal swab specimens and  should not be used as a sole basis for treatment. Nasal washings and  aspirates are unacceptable for Xpert Xpress SARS-CoV-2/FLU/RSV  testing. Fact Sheet for Patients: PinkCheek.be Fact Sheet for Healthcare Providers: GravelBags.it This test is not yet approved or cleared by the [place] FDA and  has been authorized for detection and/or diagnosis of SARS-CoV-2 by  FDA under an Emergency Use Authorization (EUA). This EUA will remain  in effect (meaning this test can be used) for the duration of the  Covid-19 declaration under Section 564(b)(1) of the Act, 21  U.S.C. section 360bbb-3(b)(1), unless the authorization is  terminated or revoked. Performed at [facility], [address] [place], [place] [address]   Blood culture (routine x 2)     Status: None (Preliminary result)   Collection Time: 09/12/2019  7:35 PM   Specimen: BLOOD  Result Value Ref Range Status   Specimen Description BLOOD RIGHT ANTECUBITAL  Final   Special Requests   Final    BOTTLES DRAWN AEROBIC AND ANAEROBIC Blood Culture results may not be optimal due to an inadequate volume of blood received in culture bottles   Culture   Final    NO  GROWTH 2 DAYS Performed at [facility], [address], [place], [place] [address]    Report Status PENDING  Incomplete  MRSA PCR Screening     Status: None   Collection Time: 09/12/19  1:07 AM   Specimen: Nasopharyngeal  Result Value Ref Range Status   MRSA by PCR NEGATIVE NEGATIVE Final    Comment:        The GeneXpert MRSA Assay (FDA approved for NASAL specimens only), is one component of a comprehensive MRSA colonization surveillance program. It is not intended to diagnose MRSA infection nor to guide or monitor treatment for MRSA infections. Performed at [facility], [address] [place], [place] [address]     Medications:  Scheduled:  . Chlorhexidine Gluconate Cloth  6 each Topical Daily  . hydrocortisone sod succinate (SOLU-CORTEF) inj  100 mg Intravenous Q8H  . thiamine injection  100 mg Intravenous Q24H   Infusions:  .  prismasol BGK 4/2.5 500 mL/hr at 09/13/19 0445  .  prismasol BGK 4/2.5 500 mL/hr at 09/13/19 0445  . sodium chloride 250 mL (09/12/19 0430)  . calcium gluconate    . esmolol 25 mcg/kg/min (09/13/19 0900)  . heparin 1,050 Units/hr (09/13/19 0900)  . meropenem (MERREM) IV Stopped (09/13/19 0536)  . norepinephrine (LEVOPHED) Adult infusion 30 mcg/min (09/13/19 0942)  . prismasol BGK 4/2.5 2,000 mL/hr at 09/13/19 0924  . vasopressin (PITRESSIN) infusion - *FOR SHOCK* 0.03 Units/min (09/13/19 0900)   PRN: acetaminophen **OR** acetaminophen, fentaNYL (SUBLIMAZE) injection, heparin, ondansetron **OR** ondansetron (ZOFRAN) IV  Assessment: 79 yo female admitted with AKI ( Scr 3.12, Baseline: 1.05), Severe sepsis, and acute metabolic encephalopathy. Pharmacy consulted to  review medications DAILY for adjustment due to CRRT.  CRRT initiated 4/17   Plan:  Medications have been reviewed for appropriate dosing with CRRT.  No other medication dose adjustments needed at this time.   Pernell Dupre, PharmD, BCPS Clinical  Pharmacist 09/13/2019 9:51 AM

## 2019-09-13 NOTE — Consult Note (Signed)
PHARMACY CONSULT NOTE - FOLLOW UP  Pharmacy Consult for Electrolyte Monitoring and Replacement   Recent Labs: Potassium (mmol/L)  Date Value  09/13/2019 4.3  08/29/2012 3.9   Magnesium (mg/dL)  Date Value  09/13/2019 1.9   Calcium (mg/dL)  Date Value  09/13/2019 7.5 (L)   Calcium, Total (mg/dL)  Date Value  08/29/2012 8.9   Albumin (g/dL)  Date Value  09/13/2019 2.5 (L)  08/29/2012 4.0   Phosphorus (mg/dL)  Date Value  09/13/2019 2.8   Sodium (mmol/L)  Date Value  09/13/2019 138  08/29/2012 142   Corrected Calcium: 7.66   Assessment: Pharmacy consulted for electrolyte monitoring and replacement for 79 yo female admitted with AKI. Patient initiated on CRRT 4/17.   Goal of Therapy:  Electrolytes WNL   Plan:  Will order Calcium gluconate 2g IV x 1 dose.  No additional replacement warranted at this time.  Next labs ordered @ 1600.   Will F/U with next labs and continue to replace electrolytes as needed.   4/18 @ 1600 :     Ca = 7.5 , Albumin = 2.5,  Corrected Ca = 8.7 Will order Calcium gluconate 1 gm IV X 1 . All other electrolytes WNL Will recheck electrolytes on 4/19 @ 0500.    Orene Desanctis, PharmD Clinical Pharmacist 09/13/2019 6:08 PM

## 2019-09-13 NOTE — Progress Notes (Signed)
09/13/2019  Subjective: Patient has become more confused and delirious overnight.  She was started on CRRT yesterday as well due to her AKI with acidosis and oliguria.  This morning, Her WBC is mildly increased to 11.5, Cr is improved to 1.82, with improvement in her electrolytes and calcium.  Lipase and amylase are normal today.  Her lactic acid continues to be elevated to 3.9 with CO2 of 20.  Although disoriented to place, she knows her name, year, and president.  She reports that her abdominal pain has improved.  Vital signs: Temp:  [98.3 F (36.8 C)-99.9 F (37.7 C)] 99.3 F (37.4 C) (04/18 0800) Pulse Rate:  [97-129] 102 (04/18 0800) Resp:  [17-36] 24 (04/18 0800) BP: (55-157)/(26-128) 144/80 (04/18 0800) SpO2:  [93 %-99 %] 97 % (04/18 0800) FiO2 (%):  [70 %] 70 % (04/18 0800) Weight:  [79.3 kg] 79.3 kg (04/18 0316)   Intake/Output: 04/17 0701 - 04/18 0700 In: 4949.4 [I.V.:2934.8; IV Piggyback:2014.6] Out: 2960 [Urine:455] Last BM Date: 09/20/2019  Physical Exam: Constitutional:  No acute distress, but disoriented to place, trying to move in bed. Abdomen:  Softer than yesterday, not particularly distended, with only some soreness towards the right of the abdomen.  No peritonitis.  Labs:  Recent Labs    09/12/19 0446 09/13/19 0319  WBC 9.5 11.5*  HGB 14.2 12.3  HCT 42.3 35.5*  PLT 234 234   Recent Labs    09/12/19 1733 09/13/19 0319  NA 135 138  K 4.0 4.0  CL 101 104  CO2 16* 20*  GLUCOSE 136* 118*  BUN 64* 36*  CREATININE 3.49* 1.82*  CALCIUM 6.2* 6.7*   Recent Labs    09/16/2019 1841 09/12/19 0446  LABPROT 15.5* 21.5*  INR 1.2 1.9*    Imaging: US Venous Img Lower Bilateral (DVT)  Result Date: 09/12/2019 CLINICAL DATA:  Lower extremity pain. EXAM: BILATERAL LOWER EXTREMITY VENOUS DOPPLER ULTRASOUND TECHNIQUE: Gray-scale sonography with graded compression, as well as color Doppler and duplex ultrasound were performed to evaluate the lower extremity deep  venous systems from the level of the common femoral vein and including the common femoral, femoral, profunda femoral, popliteal and calf veins including the posterior tibial, peroneal and gastrocnemius veins when visible. The superficial great saphenous vein was also interrogated. Spectral Doppler was utilized to evaluate flow at rest and with distal augmentation maneuvers in the common femoral, femoral and popliteal veins. COMPARISON:  None. FINDINGS: RIGHT LOWER EXTREMITY Common Femoral Vein: No evidence of thrombus. Normal compressibility, respiratory phasicity and response to augmentation. Saphenofemoral Junction: No evidence of thrombus. Normal compressibility and flow on color Doppler imaging. Profunda Femoral Vein: No evidence of thrombus. Normal compressibility and flow on color Doppler imaging. Femoral Vein: No evidence of thrombus. Normal compressibility, respiratory phasicity and response to augmentation. Popliteal Vein: No evidence of thrombus. Normal compressibility, respiratory phasicity and response to augmentation. Calf Veins: No evidence of thrombus. Normal compressibility and flow on color Doppler imaging. LEFT LOWER EXTREMITY Common Femoral Vein: No evidence of thrombus. Normal compressibility, respiratory phasicity and response to augmentation. Saphenofemoral Junction: No evidence of thrombus. Normal compressibility and flow on color Doppler imaging. Profunda Femoral Vein: No evidence of thrombus. Normal compressibility and flow on color Doppler imaging. Femoral Vein: No evidence of thrombus. Normal compressibility, respiratory phasicity and response to augmentation. Popliteal Vein: No evidence of thrombus. Normal compressibility, respiratory phasicity and response to augmentation. Calf Veins: No evidence of thrombus. Normal compressibility and flow on color Doppler imaging. Other Findings:  None.  IMPRESSION: No evidence of deep venous thrombosis in either lower extremity. Electronically Signed    By: Markus Daft M.D.   On: 09/12/2019 13:10   DG Chest Port 1 View  Result Date: 09/12/2019 CLINICAL DATA:  Left central line placement EXAM: PORTABLE CHEST 1 VIEW COMPARISON:  09/12/2019 at 9:14 a.m. FINDINGS: Dual lumen left central line tip: Right atrium. No pneumothorax complicating feature observed. Low lung volumes. Mild bibasilar retro diaphragmatic/retrocardiac densities potentially from atelectasis, pneumonia, or layering effusion. No edema noted. IMPRESSION: 1. Dual lumen left central line tip: Right atrium. No pneumothorax or complicating feature observed. 2. Mild bibasilar retrodiaphragmatic/retrocardiac densities potentially from atelectasis, pneumonia, or layering effusion. Electronically Signed   By: Van Clines M.D.   On: 09/12/2019 17:55   ECHOCARDIOGRAM COMPLETE  Result Date: 09/12/2019    ECHOCARDIOGRAM REPORT   Patient Name:   Olivia [name] Date of Exam: 09/12/2019 Medical Rec #:  [id number]       Height:       62.0 in Accession #:    [id number]      Weight:       171.3 lb Date of Birth:  [birth year]        BSA:          1.790 m Patient Age:    79 years        BP:           128/111 mmHg Patient Gender: F               HR:           135 bpm. Exam Location:  [facility] Procedure: 2D Echo and Intracardiac Opacification Agent Indications:     Abnormal ECG  History:         Patient has prior history of Echocardiogram examinations. CAD,                  Arrythmias:Atrial Fibrillation and Atrial Flutter; Risk                  Factors:Hypertension.  Sonographer:     Raliegh Ip Thornton-Maynard Referring Phys:  [id number] Bradly Bienenstock Diagnosing Phys: Ida Rogue MD  Sonographer Comments: TDS due to breast reconstruction. IMPRESSIONS  1. Challenging images. Definity used.  2. Left ventricular ejection fraction, by estimation, is 55 to 60%. The left ventricle has grossly normal function. Left ventricular endocardial border not optimally defined to evaluate regional wall motion. There is moderate left  ventricular hypertrophy. Left ventricular diastolic parameters are consistent with Grade I diastolic dysfunction (impaired relaxation).  3. Right ventricular systolic function was not well visualized. The right ventricular size is normal.  4. The mitral valve was not well visualized.  5. The aortic valve was not well visualized.  6. Tachycardia noted FINDINGS  Left Ventricle: Left ventricular ejection fraction, by estimation, is 55 to 60%. The left ventricle has normal function. Left ventricular endocardial border not optimally defined to evaluate regional wall motion. Definity contrast agent was given IV to delineate the left ventricular endocardial borders. The left ventricular internal cavity size was normal in size. There is moderate left ventricular hypertrophy. Left ventricular diastolic parameters are consistent with Grade I diastolic dysfunction (impaired relaxation). Right Ventricle: The right ventricular size is normal. No increase in right ventricular wall thickness. Right ventricular systolic function was not well visualized. Left Atrium: Left atrial size was not well visualized. Right Atrium: Right atrial size was not well visualized. Pericardium: The pericardium was not well visualized. Mitral  Valve: The mitral valve was not well visualized. Normal mobility of the mitral valve leaflets. No evidence of mitral valve regurgitation. No evidence of mitral valve stenosis. Tricuspid Valve: The tricuspid valve is not well visualized. Tricuspid valve regurgitation is not demonstrated. No evidence of tricuspid stenosis. Aortic Valve: The aortic valve was not well visualized. Aortic valve regurgitation is not visualized. No aortic stenosis is present. Aortic valve peak gradient measures 4.8 mmHg. Pulmonic Valve: The pulmonic valve was not well visualized. Pulmonic valve regurgitation is not visualized. No evidence of pulmonic stenosis. Aorta: The aortic root was not well visualized and the ascending aorta was not  well visualized. Venous: The pulmonary veins were not well visualized. The inferior vena cava is normal in size with greater than 50% respiratory variability, suggesting right atrial pressure of 3 mmHg. IAS/Shunts: The interatrial septum was not well visualized.  LEFT VENTRICLE PLAX 2D LVIDd:         2.63 cm  Diastology LVIDs:         2.20 cm  LV e' lateral:   4.79 cm/s LV PW:         1.36 cm  LV E/e' lateral: 10.9 LV IVS:        1.54 cm  LV e' medial:    3.37 cm/s LVOT diam:     1.70 cm  LV E/e' medial:  15.5 LVOT Area:     2.27 cm  AORTIC VALVE AV Area (Vmax): 1.50 cm AV Vmax:        109.00 cm/s AV Peak Grad:   4.8 mmHg LVOT Vmax:      71.80 cm/s MV E velocity: 52.40 cm/s MV A velocity: 63.50 cm/s  SHUNTS MV E/A ratio:  0.83        Systemic Diam: 1.70 cm Ida Rogue MD Electronically signed by Ida Rogue MD Signature Date/Time: 09/12/2019/3:04:27 PM    Final     Assessment/Plan: This is a 79 y.o. female with AKI, dehydration, gastroenteritis with mesenteric edema and possible pancreatitis.  --The patient's abdominal exam is better this morning.  Although her lactic acid is still elevated, I think if this were to be something like bowel ischemia, her exam would only be worsening and would be more peritoneal.  Right now she really just has soreness in the right side rather than diffuse like yesterday.  Don't think there's an indication for any surgical intervention at this point. --continue with CRRT per nephrology recs, supportive care for her gastroenteritis, AKI. --will sign off for now, but please feel free to contact me again if any issues or concerns.    Melvyn Neth, [facility]

## 2019-09-13 NOTE — Progress Notes (Signed)
ANTICOAGULATION CONSULT NOTE -   Pharmacy Consult for Heparin Indication: A. Flutter and presumed PE   Allergies  Allergen Reactions  . Neomycin Anaphylaxis    blisters  . Lithium   . Meperidine Hcl     REACTION: UNSPECIFIED  . Pravastatin Sodium     Weakness, resolved after stopping medicine 2013  . Systane [Polyethyl Glycol-Propyl Glycol] Rash    Patient Measurements: Height: 5\' 2"  (157.5 cm) Weight: 79.3 kg (174 lb 13.2 oz) IBW/kg (Calculated) : 50.1 HEPARIN DW (KG): 67.1  Vital Signs: Temp: 98.5 F (36.9 C) (04/18 0000) Temp Source: Axillary (04/18 0000) BP: 109/48 (04/18 0330) Pulse Rate: 108 (04/18 0330)  Labs: Recent Labs    09/01/2019 1841 09/15/2019 2051 09/12/19 0446 09/12/19 0446 09/12/19 0703 09/12/19 1005 09/12/19 1154 09/12/19 1440 09/12/19 1733 09/13/19 0244 09/13/19 0319  HGB 16.3*   < > 14.2  --   --   --   --   --   --   --  12.3  HCT 48.0*  --  42.3  --   --   --   --   --   --   --  35.5*  PLT 298  --  234  --   --   --   --   --   --   --  234  APTT 34  --   --   --   --   --   --   --   --   --   --   LABPROT 15.5*  --  21.5*  --   --   --   --   --   --   --   --   INR 1.2  --  1.9*  --   --   --   --   --   --   --   --   HEPARINUNFRC  --   --   --   --   --   --  0.71*  --   --  0.65  --   CREATININE 3.12*   < > 3.32*   < >  --   --   --  3.74* 3.49*  --  1.82*  TROPONINIHS 648*   < >  --   --  1,262* 1,255*  --   --  1,288*  --   --    < > = values in this interval not displayed.    Estimated Creatinine Clearance: 24.9 mL/min (A) (by C-G formula based on SCr of 1.82 mg/dL (H)).   Medical History: Past Medical History:  Diagnosis Date  . Agitation   . Allergic    Immunotherapy  . Allergic rhinitis   . ALLERGIC RHINITIS 10/26/2006   Qualifier: Diagnosis of  By: Council Mechanic MD, Hilaria Ota   . Anemia, iron deficiency   . Asthma   . Barrett's esophagus 01/04/2011  . Bipolar affective disorder ([place])   . BIPOLAR AFFECTIVE DISORDER,  DEPRESSED, HX OF 10/25/2006   Qualifier: Diagnosis of  By: Zara Council LPN, Mariann Laster    . Blood in stool    Via stool cards  . Blood transfusion without reported diagnosis   . Breast cancer ([place]) 1973   Bilateral Mastectomies  . Carpal tunnel syndrome   . Cervical cancer [facility])    Hysterectomy  . Chest pain   . Chronic kidney disease   . 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 Constipation    Mild  .  Depressed   . Diarrhea   . DVT (deep venous thrombosis) ([place])   . Fibromyalgia   . Gastric ulcer    Acute  . Gastritis    Barretts, Stricture  . GERD (gastroesophageal reflux disease)   . Gross hematuria 06/23/2015  . History of kidney stones   . Hyperlipidemia   . Hypertension   . HYPERTENSION 10/26/2006   Qualifier: Diagnosis of  By: Council Mechanic MD, Hilaria Ota   . Hypothyroidism   . 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 Ophthalmoplegia    Dr. Nanine [facility] at [facility]  . Osteoarthritis    Dr. Marveen Reeks Dr. Jefm Bryant  . Osteoarthrosis    Dr. Marveen Reeks  . PAF (paroxysmal atrial fibrillation) ([place])   . Pain 01/2010   Declined by [facility] pain clinic  . Pneumonia    with trach / 2 YOA and 14 YOA  . PONV (postoperative nausea and vomiting)    past hx.-none recent  . Renal artery stenosis ([place]) 01/04/2011   S/p angioplasty of vessels x2   . Rheumatoid arthritis(714.0)    Dr. Jefm Bryant  . Sinusitis, acute 07/09/2012    Medications:  Medications Prior to Admission  Medication Sig Dispense Refill Last Dose  . acetaminophen (TYLENOL) 325 MG tablet Take 650 mg by mouth every 6 (six) hours as needed for mild pain or fever.    Unknown at PRN  . aspirin 81 MG tablet Take 81 mg by mouth daily.   24+ hours at Unknown  . baclofen (LIORESAL) 10 MG tablet Take 5 mg by mouth 2 (two) times daily.    24+ hours at Unknown  . Calcium Carbonate-Vitamin D (CALTRATE 600+D PO) Take 1 tablet by mouth 2 (two) times a day.      . Cholecalciferol (D 5000) 125 MCG (5000 UT) capsule Take 5,000  Units by mouth daily.      . DULoxetine (CYMBALTA) 60 MG capsule Take 60 mg by mouth every morning.    24+ hours at Unknown  . fenofibrate (TRICOR) 145 MG tablet Take 145 mg by mouth daily.   24+ hours at Unknown  . gabapentin (NEURONTIN) 300 MG capsule Take 300 mg by mouth 3 (three) times daily.    24+ hours at Unknown  . lamoTRIgine (LAMICTAL) 25 MG tablet Take 50 mg by mouth at bedtime.    24+ hours at Unknown  . levothyroxine (SYNTHROID) 25 MCG tablet Take 25 mcg by mouth daily before breakfast.   24+ hours at Unknown  . losartan (COZAAR) 25 MG tablet Take 25 mg by mouth daily.    24+ hours at Unknown  . metoprolol tartrate (LOPRESSOR) 50 MG tablet Take 50 mg by mouth 2 (two) times daily.   24+ hours at Unknown  . Multiple Vitamin (MULTIVITAMIN) tablet Take 1 tablet by mouth daily.       . pantoprazole (PROTONIX) 40 MG tablet Take 40 mg by mouth 2 (two) times daily.    24+ hours at Unknown  . primidone (MYSOLINE) 50 MG tablet Take 50 mg by mouth 2 (two) times a day.    24+ hours at Unknown  . propranolol (INDERAL) 10 MG tablet Take 10 mg by mouth 2 (two) times daily.   24+ hours at Unknown  . vitamin C (ASCORBIC ACID) 250 MG tablet Take 1,000 mg by mouth in the morning and at bedtime.        Assessment: Pharmacy consulted for heparin infusion dosing and monitor for atrial flutter and presumed PE in  79 yo female. Patient is admitted with acute metabolic encephalopathy and sepsis.  Baseline labs ordered. APTT WNL.  No anticoagulants per PTA med list.   4/16 Heparin infusion started @ 1200 units/hr   Goal of Therapy:  Heparin level 0.3-0.7 units/ml Monitor platelets by anticoagulation protocol: Yes   Plan:  4/17 @ 1154 HL 0.71. Level is slightly subtherapeutic.  Will decrease heparin infusion to 1050 units/hr.   Recheck Check HL ~ 6 hours after rate adjustment Continue to monitor CBC daily per protocol.   4/17:  Spoke with RN ,  Heparin drip was stopped around 1400 for CRRT line  placement, heparin drip restarted @ 1900. Will draw HL 8 hrs after restart on 04/18 @ 0300.   4/18 0244 HL 0.65, therapeutic x 1.  CBC stable.  Continue current rate and recheck HL in 8 hours to confirm.  Ena Dawley, PharmD Clinical Pharmacist 09/13/2019 4:00 AM

## 2019-09-13 NOTE — Progress Notes (Signed)
Pharmacy Antibiotic Note  Olivia [name] is a 79 y.o. female admitted on 09/05/2019 with sepsis.  Pharmacy has been consulted for Vancomycin dosing.  Pt is AKI on ESRD,  Currently on CRRT.   Plan: Vancomycin 1750 mg IV X 1 ordered for 4/18 @ 2200. Vancomycin 750 mg IV Q12H ordered to start on 4/19 (per uptodate recommendations) No trough currently ordered.   Height: 5\' 2"  (157.5 cm) Weight: 79.3 kg (174 lb 13.2 oz) IBW/kg (Calculated) : 50.1  Temp (24hrs), Avg:98.9 F (37.2 C), Min:98.5 F (36.9 C), Max:99.3 F (37.4 C)  Recent Labs  Lab 09/13/2019 1841 09/25/2019 2051 09/12/19 0446 09/12/19 0703 09/12/19 1154 09/12/19 1440 09/12/19 1733 09/13/19 0319 09/13/19 1611  WBC 15.1*  --  9.5  --   --   --   --  11.5*  --   CREATININE 3.12*   < > 3.32*  --   --  3.74* 3.49* 1.82* 1.19*  LATICACIDVEN 9.8*   < > 3.8* 2.8* 3.0*  --  3.2* 3.9*  --    < > = values in this interval not displayed.    Estimated Creatinine Clearance: 38 mL/min (A) (by C-G formula based on SCr of 1.19 mg/dL (H)).    Allergies  Allergen Reactions  . Neomycin Anaphylaxis    blisters  . Lithium   . Meperidine Hcl     REACTION: UNSPECIFIED  . Pravastatin Sodium     Weakness, resolved after stopping medicine 2013  . Systane [Polyethyl Glycol-Propyl Glycol] Rash    Antimicrobials this admission:   >>    >>   Dose adjustments this admission:   Microbiology results:  BCx:   UCx:    Sputum:    MRSA PCR:   Thank you for allowing pharmacy to be a part of this patient's care.  Kimanh Templeman D 09/13/2019 8:48 PM

## 2019-09-13 NOTE — Progress Notes (Addendum)
ANTICOAGULATION CONSULT NOTE -   Pharmacy Consult for Heparin Indication: A. Flutter and presumed PE   Allergies  Allergen Reactions  . Neomycin Anaphylaxis    blisters  . Lithium   . Meperidine Hcl     REACTION: UNSPECIFIED  . Pravastatin Sodium     Weakness, resolved after stopping medicine 2013  . Systane [Polyethyl Glycol-Propyl Glycol] Rash    Patient Measurements: Height: 5\' 2"  (157.5 cm) Weight: 79.3 kg (174 lb 13.2 oz) IBW/kg (Calculated) : 50.1 HEPARIN DW (KG): 67.1  Vital Signs: Temp: 98.8 F (37.1 C) (04/18 1200) Temp Source: Axillary (04/18 1200) BP: 145/54 (04/18 1200) Pulse Rate: 97 (04/18 1200)  Labs: Recent Labs    09/13/2019 1841 09/14/2019 2051 09/12/19 0446 09/12/19 0446 09/12/19 0703 09/12/19 1005 09/12/19 1154 09/12/19 1440 09/12/19 1733 09/13/19 0244 09/13/19 0319 09/13/19 0815 09/13/19 1136  HGB 16.3*   < > 14.2  --   --   --   --   --   --   --  12.3  --   --   HCT 48.0*  --  42.3  --   --   --   --   --   --   --  35.5*  --   --   PLT 298  --  234  --   --   --   --   --   --   --  234  --   --   APTT 34  --   --   --   --   --   --   --   --   --   --   --   --   LABPROT 15.5*  --  21.5*  --   --   --   --   --   --   --   --   --   --   INR 1.2  --  1.9*  --   --   --   --   --   --   --   --   --   --   HEPARINUNFRC  --   --   --   --   --   --  0.71*  --   --  0.65  --   --  0.21*  CREATININE 3.12*   < > 3.32*   < >  --   --   --  3.74* 3.49*  --  1.82*  --   --   TROPONINIHS 648*   < >  --   --    < > 1,255*  --   --  1,288*  --   --  542*  --    < > = values in this interval not displayed.    Estimated Creatinine Clearance: 24.9 mL/min (A) (by C-G formula based on SCr of 1.82 mg/dL (H)).   Medical History: Past Medical History:  Diagnosis Date  . Agitation   . Allergic    Immunotherapy  . Allergic rhinitis   . ALLERGIC RHINITIS 10/26/2006   Qualifier: Diagnosis of  By: Council Mechanic MD, Hilaria Ota   . Anemia, iron deficiency    . Asthma   . Barrett's esophagus 01/04/2011  . Bipolar affective disorder ([place])   . BIPOLAR AFFECTIVE DISORDER, DEPRESSED, HX OF 10/25/2006   Qualifier: Diagnosis of  By: Zara Council LPN, Mariann Laster    . Blood in stool    Via stool cards  . Blood  transfusion without reported diagnosis   . Breast cancer ([place]) 1973   Bilateral Mastectomies  . Carpal tunnel syndrome   . Cervical cancer [facility])    Hysterectomy  . Chest pain   . Chronic kidney disease   . 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 Constipation    Mild  . Depressed   . Diarrhea   . DVT (deep venous thrombosis) ([place])   . Fibromyalgia   . Gastric ulcer    Acute  . Gastritis    Barretts, Stricture  . GERD (gastroesophageal reflux disease)   . Gross hematuria 06/23/2015  . History of kidney stones   . Hyperlipidemia   . Hypertension   . HYPERTENSION 10/26/2006   Qualifier: Diagnosis of  By: Council Mechanic MD, Hilaria Ota   . Hypothyroidism   . 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 Ophthalmoplegia    [facility]. Nanine Means [facility]  . Osteoarthritis    Dr. Marveen Reeks Dr. Jefm Bryant  . Osteoarthrosis    Dr. Marveen Reeks  . PAF (paroxysmal atrial fibrillation) ([place])   . Pain 01/2010   Declined by [facility] pain clinic  . Pneumonia    with trach / 2 YOA and 14 YOA  . PONV (postoperative nausea and vomiting)    past hx.-none recent  . Renal artery stenosis ([place]) 01/04/2011   S/p angioplasty of vessels x2   . Rheumatoid arthritis(714.0)    Dr. Jefm Bryant  . Sinusitis, acute 07/09/2012    Medications:  Medications Prior to Admission  Medication Sig Dispense Refill Last Dose  . acetaminophen (TYLENOL) 325 MG tablet Take 650 mg by mouth every 6 (six) hours as needed for mild pain or fever.    Unknown at PRN  . aspirin 81 MG tablet Take 81 mg by mouth daily.   24+ hours at Unknown  . baclofen (LIORESAL) 10 MG tablet Take 5 mg by mouth 2 (two) times daily.    24+ hours at Unknown  . Calcium Carbonate-Vitamin D (CALTRATE 600+D PO)  Take 1 tablet by mouth 2 (two) times a day.      . Cholecalciferol (D 5000) 125 MCG (5000 UT) capsule Take 5,000 Units by mouth daily.      . DULoxetine (CYMBALTA) 60 MG capsule Take 60 mg by mouth every morning.    24+ hours at Unknown  . fenofibrate (TRICOR) 145 MG tablet Take 145 mg by mouth daily.   24+ hours at Unknown  . gabapentin (NEURONTIN) 300 MG capsule Take 300 mg by mouth 3 (three) times daily.    24+ hours at Unknown  . lamoTRIgine (LAMICTAL) 25 MG tablet Take 50 mg by mouth at bedtime.    24+ hours at Unknown  . levothyroxine (SYNTHROID) 25 MCG tablet Take 25 mcg by mouth daily before breakfast.   24+ hours at Unknown  . losartan (COZAAR) 25 MG tablet Take 25 mg by mouth daily.    24+ hours at Unknown  . metoprolol tartrate (LOPRESSOR) 50 MG tablet Take 50 mg by mouth 2 (two) times daily.   24+ hours at Unknown  . Multiple Vitamin (MULTIVITAMIN) tablet Take 1 tablet by mouth daily.       . pantoprazole (PROTONIX) 40 MG tablet Take 40 mg by mouth 2 (two) times daily.    24+ hours at Unknown  . primidone (MYSOLINE) 50 MG tablet Take 50 mg by mouth 2 (two) times a day.    24+ hours at Unknown  . propranolol (INDERAL) 10 MG tablet Take  10 mg by mouth 2 (two) times daily.   24+ hours at Unknown  . vitamin C (ASCORBIC ACID) 250 MG tablet Take 1,000 mg by mouth in the morning and at bedtime.        Assessment: Pharmacy consulted for heparin infusion dosing and monitor for atrial flutter and presumed PE in 79 yo female. Patient is admitted with acute metabolic encephalopathy and sepsis.  Baseline labs ordered. APTT WNL.  No anticoagulants per PTA med list.   4/16 Heparin infusion started @ 1200 units/hr 4/17  @ 1154 HL 0.71. infusion decreased to 1050 units/hr 4/17 @ 1900 heparin infusion restarted  4/18 @ 0244 HL 0.65. Level therapeutic x 1.    Goal of Therapy:  Heparin level 0.3-0.7 units/ml Monitor platelets by anticoagulation protocol: Yes   Plan:  4/18 @ 1136 HL: 0.21.   Level is subtherapeutic. RN to redraw level to confirm. Repeat level 0.12  Will order 1000 unit bolus and increase current infusion rate to 1150 units/hr   Recheck Check HL ~ 6 hours after rate adjustment Continue to monitor CBC daily per protocol.   Pernell Dupre, PharmD, BCPS Clinical Pharmacist 09/13/2019 12:21 PM

## 2019-09-13 NOTE — Consult Note (Signed)
Pharmacy Antibiotic Note  Olivia [name] is a 79 y.o. female admitted on 09/06/2019 with an intra-abdominal infection.   Pharmacy has been consulted for meropenem dosing.  Scr: 3.32>> 1.82 WBC: 9.5>> 11.5 PCT: 63.48>> 117.68 Lactate: 2.8 >> 3.9   Patient was initiated on CRRT 4/17  Plan: Continue meropenem 1g IV every  8 hours (CRRT dosing)   Pharmacy will continue to monitor renal function and CRRT plan and adjust dose as needed.    Height: 5\' 2"  (157.5 cm) Weight: 79.3 kg (174 lb 13.2 oz) IBW/kg (Calculated) : 50.1  Temp (24hrs), Avg:98.9 F (37.2 C), Min:98.3 F (36.8 C), Max:99.9 F (37.7 C)  Recent Labs  Lab 09/15/2019 1841 09/16/2019 2051 09/12/19 0446 09/12/19 0703 09/12/19 1154 09/12/19 1440 09/12/19 1733 09/13/19 0319  WBC 15.1*  --  9.5  --   --   --   --  11.5*  CREATININE 3.12*  --  3.32*  --   --  3.74* 3.49* 1.82*  LATICACIDVEN 9.8*   < > 3.8* 2.8* 3.0*  --  3.2* 3.9*   < > = values in this interval not displayed.    Estimated Creatinine Clearance: 24.9 mL/min (A) (by C-G formula based on SCr of 1.82 mg/dL (H)).    Allergies  Allergen Reactions  . Neomycin Anaphylaxis    blisters  . Lithium   . Meperidine Hcl     REACTION: UNSPECIFIED  . Pravastatin Sodium     Weakness, resolved after stopping medicine 2013  . Systane [Polyethyl Glycol-Propyl Glycol] Rash    Antimicrobials this admission: 4/16 Cefepime/flagyl/Zosyn/Vanco >> x 1  4/17 meropenem >>   Microbiology results: 4/16 BCx: 1 of 4 GPC 4/16 MRSA PCR: negative   Thank you for allowing pharmacy to be a part of this patient's care.  Pernell Dupre, PharmD, BCPS Clinical Pharmacist 09/13/2019 9:48 AM

## 2019-09-13 NOTE — Progress Notes (Signed)
Name: Olivia [name] MRN: [id number] DOB: 1940/07/16    ADMISSION DATE:  09/17/2019 CONSULTATION DATE:  09/12/2019  REFERRING MD :  Rufina Falco, NP  CHIEF COMPLAINT:  Unresponsiveness  BRIEF PATIENT DESCRIPTION:  79 y.o. Female, DNI, admitted 09/12/19 with Acute Hypoxic Respiratory Failure secondary to ? PE, severe metabolic acidosis, SVT vs. Atrial flutter w/ RVR, elevated troponin (NSTEMI vs. Demand ischemia), Cardiogenic shock (due to rapid rate) & septic shock (in setting of acute pancreatitis & Gastroenteritis), and AKI.  She has been placed on Bicarb gtt, Heparin & Amiodarone gtts.  Cardiology & Nephrology are consulted.  SIGNIFICANT EVENTS  4/17: Admission to [place] 4/17: DNR in event of cardiopulm arrest but otherwise treat 4/17: Critically ill ~ SVT vs. A.flutter w/ RVR, Resp Failure, shock, AKI.  PCCM, Cardiology, & Nephrology consulted  4/17: Adenosine 6 mg IV x1 dose without noted improvement in HR; Cardizem d/c and placed on Amiodarone gtt 4/17: Persistent RVR despite Amio, trial of beta blocker with good response, esmolol gtt, amio d/c'd  STUDIES:  4/16: CXR>> Cardiac shadows within normal limits. Lungs are well aerated bilaterally. No focal infiltrate or sizable effusion is seen. No bony abnormality is noted. 4/16: CT Head w/o contrast>>No acute intracranial process. 4/16: CT Cervical Spine>> Extensive multilevel cervical degenerative changes. No acute fracture. 4/16: CT Abdomen & Pelvis>>1. Fluid within the distal esophagus, throughout prominent smallbowel, cecum and ascending colon with associated mesenteric edema. Overall findings consistent with generalized enteritis. Wall thickening of the duodenum proximal jejunum. 2. Mild peripancreatic fat stranding and edema most prominent about the head and uncinate process, can be seen with acute pancreatitis in the appropriate clinical setting. Recommend correlation with pancreatic enzymes. 3. Small volume  abdominopelvic ascites. Generalized mesenteric and intra-abdominal edema. Trace pleural effusions/pleural thickening. 4. 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 5. Stable 2.1 cm left ovarian cyst dating back to 2017, likely benign. 6. Additional stable chronic findings as described.  4/17: Venous US Bilateral LE>> No DVT  CULTURES: SARS-CoV-2 PCR 4/16>> negative Influenza PCR 4/16>> negative Blood culture x2 4/16>> MSSA x1 only ? contam. Urine 4/16>>negative MRSA PCR 4/17>> negative GI panel PCR 4/16>> Blood cultures x2 4/18>>  ANTIBIOTICS: Cefepime 4/16>> 4/17 Flagyl 4/16>> 4/17 Vancomycin x1 dose in ED 4/16 Zosyn 4/17>> 4/17 Meropenem >> 4/17 Vanco >> 4/17   .  prismasol BGK 4/2.5 500 mL/hr at 09/13/19 1401  .  prismasol BGK 4/2.5 500 mL/hr at 09/13/19 1401  . sodium chloride 250 mL (09/12/19 0430)  . esmolol 32 mcg/kg/min (09/13/19 2000)  . heparin 1,150 Units/hr (09/13/19 2000)  . meropenem (MERREM) IV 1 g (09/13/19 1345)  . norepinephrine (LEVOPHED) Adult infusion 22 mcg/min (09/13/19 2000)  . prismasol BGK 4/2.5 2,000 mL/hr at 09/13/19 1816  . vancomycin    . vasopressin (PITRESSIN) infusion - *FOR SHOCK* 0.03 Units/min (09/13/19 2000)      Allergies  Allergen Reactions  . Neomycin Anaphylaxis    blisters  . Lithium   . Meperidine Hcl     REACTION: UNSPECIFIED  . Pravastatin Sodium     Weakness, resolved after stopping medicine 2013  . Systane [Polyethyl Glycol-Propyl Glycol] Rash   .  REVIEW OF SYSTEMS: Unable to obtain due to patient's encephalopathy  SUBJECTIVE:  Patient is very confused, pulling at lines test to have mittens to prevent pulling of lines particularly dialysis catheter. Occasionally more lucid.  States abdominal pain better.  No other complaints reported.  Fidgety in bed.   VITAL SIGNS: Temp:  [98.5  F (36.9 C)-99.3 F (37.4 C)] 98.7 F (37.1 C) (04/18 1600) Pulse Rate:  [93-114] 95 (04/18 1930) Resp:  [17-37] 21 (04/18  1945) BP: (93-156)/(36-128) 134/36 (04/18 1945) SpO2:  [91 %-99 %] 94 % (04/18 1930) FiO2 (%):  [70 %] 70 % (04/18 0800) Weight:  [79.3 kg] 79.3 kg (04/18 0316)  PHYSICAL EXAMINATION: General:  Critically ill appearing female, laying in bed, on HFNC, with mild respiratory distress Neuro:  Awake, confused, no focal deficits, speech garbled HEENT:  Atraumatic, normocephalic, neck supple, no JVD, left IJ Trialysis catheter Cardiovascular:  Tachycardia, regular rhythm, 1+ pulses, cool extremities though less dusky, no edema Lungs:  Clear to auscultation bilaterally, no wheezing or rales, tachycpnea, no overt accessory muscle use Abdomen:  Soft, non-tender to palpitation, mildly distended, no guarding or rebound tenderness Musculoskeletal:  Generalized weakness, no deformities, no edema Skin:  Cool and dry.  No obvious rashes, lesions, or ulcerations  Recent Labs  Lab 09/12/19 1733 09/13/19 0319 09/13/19 1611  NA 135 138 138  K 4.0 4.0 4.3  CL 101 104 104  CO2 16* 20* 22  BUN 64* 36* 22  CREATININE 3.49* 1.82* 1.19*  GLUCOSE 136* 118* 119*   Recent Labs  Lab 09/13/2019 1841 09/12/19 0446 09/13/19 0319  HGB 16.3* 14.2 12.3  HCT 48.0* 42.3 35.5*  WBC 15.1* 9.5 11.5*  PLT 298 234 234   CT ABDOMEN PELVIS WO CONTRAST  Result Date: 09/06/2019 CLINICAL DATA:  Abdominal trauma. Vomiting for 2 days. EXAM: CT ABDOMEN AND PELVIS WITHOUT CONTRAST TECHNIQUE: Multidetector CT imaging of the abdomen and pelvis was performed following the standard protocol without IV contrast. COMPARISON:  CT 06/30/2018 FINDINGS: Lower chest: Dependent atelectasis with minimal pleural effusions/pleural thickening. Calcified granuloma at the right lung base. Hepatobiliary: No evidence of focal hepatic abnormality allowing for lack contrast. Small volume of perihepatic free fluid measures simple fluid density. Post cholecystectomy. No biliary dilatation. Pancreas: Mild peripancreatic fat stranding and edema most  prominent about the head and uncinate process. No ductal dilatation. Spleen: Normal in size. No evidence of focal abnormality allowing for lack contrast. Perisplenic free fluid measures simple fluid density. Adrenals/Urinary Tract: No evidence of adrenal nodule or hemorrhage. No hydronephrosis. Bilateral renal parenchymal thinning. There is bilateral perinephric edema, left greater than right. 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 Bladder partially distended, no definite bladder wall thickening. Stomach/Bowel: Bowel evaluation is limited in the absence of IV and enteric contrast. Fluid distends the distal esophagus. Surgical clips at the gastroesophageal junction. Minimal intraluminal fluid within partially distended stomach. There is wall thickening about the duodenum and proximal jejunum. Small bowel is diffusely fluid-filled prominent measuring up to 3 cm in the pelvis. There is no discrete transition point. Appendix is surgically absent. Fluid within the cecum, ascending and transverse colon. Remainder of the colon is nondistended.No evidence of pneumatosis. There is generalized mesenteric edema. Vascular/Lymphatic: Aortic atherosclerosis. No aortic aneurysm. No retroperitoneal fluid. 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 Limited assessment for adenopathy, no grossly enlarged lymph nodes in the abdomen or pelvis. Reproductive: Stable 2.1 cm left ovarian cyst. Right ovary not definitively seen. Prior hysterectomy. Other: Small volume abdominopelvic ascites, measuring simple fluid density. Generalized mesenteric and intra-abdominal edema. No free air. Postsurgical changes the anterior abdominal wall. Musculoskeletal: There are no acute or suspicious osseous abnormalities.  Multilevel degenerative  change in the lumbar spine. IMPRESSION: 1. Fluid within the distal esophagus, throughout prominent small bowel, cecum and ascending colon with associated mesenteric edema. Overall findings consistent with generalized enteritis. Wall thickening of the duodenum proximal jejunum. 2. Mild peripancreatic fat stranding and edema most prominent about the head and uncinate process, can be seen with acute pancreatitis in the appropriate clinical setting. Recommend correlation with pancreatic enzymes. 3. Small volume abdominopelvic ascites. Generalized mesenteric and intra-abdominal edema. Trace pleural effusions/pleural thickening. 4. 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 5. Stable 2.1 cm left ovarian cyst dating back to 2017, likely benign. 6. Additional stable chronic findings as described. Aortic Atherosclerosis (ICD10-I70.0). Electronically Signed   By: Keith Rake M.D.   On: 08/28/2019 21:53   CT Head Wo Contrast  Result Date: 09/17/2019 CLINICAL DATA:  Unresponsive, headache, hypoxia EXAM: CT HEAD WITHOUT CONTRAST TECHNIQUE: Contiguous axial images were obtained from the base of the skull through the vertex without intravenous contrast. COMPARISON:  10/09/2016 FINDINGS: Brain: No acute infarct or hemorrhage. Lateral ventricles and midline structures are stable. 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 No acute extra-axial fluid collections. No mass effect. Vascular: No hyperdense vessel or unexpected calcification. Skull: Normal. Negative for fracture or focal lesion. Sinuses/Orbits: Stable bilateral ethmoidectomies. Remaining sinuses are clear. Other: None. IMPRESSION: No acute intracranial process. Electronically Signed   By: Randa Ngo M.D.   On: 09/04/2019 21:43   CT Cervical Spine Wo Contrast  Result Date: 09/14/2019 CLINICAL DATA:  Unresponsive, hypoxia, dizziness, trauma EXAM: CT CERVICAL SPINE WITHOUT CONTRAST TECHNIQUE: Multidetector CT imaging of the cervical spine was  performed without intravenous contrast. Multiplanar CT image reconstructions were also generated. COMPARISON:  None. FINDINGS: Alignment: Alignment is anatomic. Skull base and vertebrae: No acute displaced cervical spine fracture. Soft tissues and spinal canal: No prevertebral fluid or swelling. No visible canal hematoma. Disc levels: There is extensive multilevel cervical spondylosis and facet hypertrophy, from C4 through T1. Significant neural foraminal encroachment bilaterally at C3/C4 and right predominant at C4/C5 and C5/C6. Upper chest: Airway is patent.  Lung apices are clear. Other: Reconstructed images demonstrate no additional findings. IMPRESSION: 1. Extensive multilevel cervical degenerative changes. No acute fracture. Electronically Signed   By: Randa Ngo M.D.   On: 09/23/2019 21:42   US Venous Img Lower Bilateral (DVT)  Result Date: 09/12/2019 CLINICAL DATA:  Lower extremity pain. EXAM: BILATERAL LOWER EXTREMITY VENOUS DOPPLER ULTRASOUND TECHNIQUE: Gray-scale sonography with graded compression, as well as color Doppler and duplex ultrasound were performed to evaluate the lower extremity deep venous systems from the level of the common femoral vein and including the common femoral, femoral, profunda femoral, popliteal and calf veins including the posterior tibial, peroneal and gastrocnemius veins when visible. The superficial great saphenous vein was also interrogated. Spectral Doppler was utilized to evaluate flow at rest and with distal augmentation maneuvers in the common femoral, femoral and popliteal veins. COMPARISON:  None. FINDINGS: RIGHT LOWER EXTREMITY Common Femoral Vein: No evidence of thrombus. Normal compressibility, respiratory phasicity and response to augmentation. Saphenofemoral Junction: No evidence of thrombus. Normal compressibility and flow on color Doppler imaging. Profunda Femoral Vein: No evidence of thrombus. Normal compressibility and flow on color Doppler imaging.  Femoral Vein: No evidence of thrombus. Normal compressibility, respiratory phasicity and response to augmentation. Popliteal Vein: No evidence of thrombus. Normal compressibility, respiratory phasicity and response to augmentation. Calf Veins: No evidence of thrombus. Normal compressibility and flow on color Doppler imaging. LEFT LOWER EXTREMITY Common Femoral  Vein: No evidence of thrombus. Normal compressibility, respiratory phasicity and response to augmentation. Saphenofemoral Junction: No evidence of thrombus. Normal compressibility and flow on color Doppler imaging. Profunda Femoral Vein: No evidence of thrombus. Normal compressibility and flow on color Doppler imaging. Femoral Vein: No evidence of thrombus. Normal compressibility, respiratory phasicity and response to augmentation. Popliteal Vein: No evidence of thrombus. Normal compressibility, respiratory phasicity and response to augmentation. Calf Veins: No evidence of thrombus. Normal compressibility and flow on color Doppler imaging. Other Findings:  None. IMPRESSION: No evidence of deep venous thrombosis in either lower extremity. Electronically Signed   By: Markus Daft M.D.   On: 09/12/2019 13:10   DG Chest Port 1 View  Result Date: 09/12/2019 CLINICAL DATA:  Left central line placement EXAM: PORTABLE CHEST 1 VIEW COMPARISON:  09/12/2019 at 9:14 a.m. FINDINGS: Dual lumen left central line tip: Right atrium. No pneumothorax complicating feature observed. Low lung volumes. Mild bibasilar retro diaphragmatic/retrocardiac densities potentially from atelectasis, pneumonia, or layering effusion. No edema noted. IMPRESSION: 1. Dual lumen left central line tip: Right atrium. No pneumothorax or complicating feature observed. 2. Mild bibasilar retrodiaphragmatic/retrocardiac densities potentially from atelectasis, pneumonia, or layering effusion. Electronically Signed   By: Van Clines M.D.   On: 09/12/2019 17:55   DG Chest Port 1 View  Result  Date: 09/12/2019 CLINICAL DATA:  Respiratory failure EXAM: PORTABLE CHEST 1 VIEW COMPARISON:  Chest x-ray dated 09/15/2019. FINDINGS: Heart size and mediastinal contours are within normal limits. Pacer pad overlies the LEFT heart border. Mild atelectasis/scarring at the LEFT lung base. No confluent opacity to suggest a developing pneumonia. No evidence of pulmonary edema. No pleural effusion or pneumothorax is seen. IMPRESSION: No active disease. No evidence of pneumonia or pulmonary edema. Electronically Signed   By: Franki Cabot M.D.   On: 09/12/2019 11:27   ECHOCARDIOGRAM COMPLETE  Result Date: 09/12/2019    ECHOCARDIOGRAM REPORT   Patient Name:   DELOISE BROSIUS Date of Exam: 09/12/2019 Medical Rec #:  [id number]       Height:       62.0 in Accession #:    [id number]      Weight:       171.3 lb Date of Birth:  November 11, 1940        BSA:          1.790 m Patient Age:    81 years        BP:           128/111 mmHg Patient Gender: F               HR:           135 bpm. Exam Location:  [facility] Procedure: 2D Echo and Intracardiac Opacification Agent Indications:     Abnormal ECG  History:         Patient has prior history of Echocardiogram examinations. CAD,                  Arrythmias:Atrial Fibrillation and Atrial Flutter; Risk                  Factors:Hypertension.  Sonographer:     Raliegh Ip Thornton-Maynard Referring Phys:  [id number] Bradly Bienenstock Diagnosing Phys: Ida Rogue MD  Sonographer Comments: TDS due to breast reconstruction. IMPRESSIONS  1. Challenging images. Definity used.  2. Left ventricular ejection fraction, by estimation, is 55 to 60%. The left ventricle has grossly normal function. Left ventricular endocardial border not optimally defined to evaluate  regional wall motion. There is moderate left ventricular hypertrophy. Left ventricular diastolic parameters are consistent with Grade I diastolic dysfunction (impaired relaxation).  3. Right ventricular systolic function was not well visualized. The right  ventricular size is normal.  4. The mitral valve was not well visualized.  5. The aortic valve was not well visualized.  6. Tachycardia noted FINDINGS  Left Ventricle: Left ventricular ejection fraction, by estimation, is 55 to 60%. The left ventricle has normal function. Left ventricular endocardial border not optimally defined to evaluate regional wall motion. Definity contrast agent was given IV to delineate the left ventricular endocardial borders. The left ventricular internal cavity size was normal in size. There is moderate left ventricular hypertrophy. Left ventricular diastolic parameters are consistent with Grade I diastolic dysfunction (impaired relaxation). Right Ventricle: The right ventricular size is normal. No increase in right ventricular wall thickness. Right ventricular systolic function was not well visualized. Left Atrium: Left atrial size was not well visualized. Right Atrium: Right atrial size was not well visualized. Pericardium: The pericardium was not well visualized. Mitral Valve: The mitral valve was not well visualized. Normal mobility of the mitral valve leaflets. No evidence of mitral valve regurgitation. No evidence of mitral valve stenosis. Tricuspid Valve: The tricuspid valve is not well visualized. Tricuspid valve regurgitation is not demonstrated. No evidence of tricuspid stenosis. Aortic Valve: The aortic valve was not well visualized. Aortic valve regurgitation is not visualized. No aortic stenosis is present. Aortic valve peak gradient measures 4.8 mmHg. Pulmonic Valve: The pulmonic valve was not well visualized. Pulmonic valve regurgitation is not visualized. No evidence of pulmonic stenosis. Aorta: The aortic root was not well visualized and the ascending aorta was not well visualized. Venous: The pulmonary veins were not well visualized. The inferior vena cava is normal in size with greater than 50% respiratory variability, suggesting right atrial pressure of 3 mmHg.  IAS/Shunts: The interatrial septum was not well visualized.  LEFT VENTRICLE PLAX 2D LVIDd:         2.63 cm  Diastology LVIDs:         2.20 cm  LV e' lateral:   4.79 cm/s LV PW:         1.36 cm  LV E/e' lateral: 10.9 LV IVS:        1.54 cm  LV e' medial:    3.37 cm/s LVOT diam:     1.70 cm  LV E/e' medial:  15.5 LVOT Area:     2.27 cm  AORTIC VALVE AV Area (Vmax): 1.50 cm AV Vmax:        109.00 cm/s AV Peak Grad:   4.8 mmHg LVOT Vmax:      71.80 cm/s MV E velocity: 52.40 cm/s MV A velocity: 63.50 cm/s  SHUNTS MV E/A ratio:  0.83        Systemic Diam: 1.70 cm Ida Rogue MD Electronically signed by Ida Rogue MD Signature Date/Time: 09/12/2019/3:04:27 PM    Final     ASSESSMENT / PLAN:  Acute Hypoxic Respiratory Failure in setting of metabolic acidosis,  SVT/A.flutter w/ RVR Possible aspiration pneumonia (retrocardiac infiltrate) Severe sepsis with systemic inflammatory response -Supplemental O2 as needed to maintain O2 sats greater than 92% -High flow O2 as needed -Patient is DNR but agreed to CRRT -Follow intermittent chest x-ray and ABG as needed -CXR on 4/17 with retrocardiac densities potentially atelectasis versus pneumonia -PRN bronchodilators -IV meropenem, added vancomycin -Heparin gtt -Lower extremity Dopplers negative for DVT  Cardiogenic shock (secondary to  rapid HR) + Septic Shock (secondary to Acute Pancreatitis & Gastroenteritis) SVT vs. Atrial Flutter w/ RVR Elevated troponin in setting of demand ischemia Chronic HFpEF, currently without acute exacerbation -Continuous cardiac monitoring -Maintain MAP greater than 65 -Cautious IV fluids given HFpEF and AKI -Norepinephrine plus vasopressin as needed to maintain map goal -On esmolol infusion to control heart rate, so far tolerating -Heparin drip for anticoagulation -Trend troponin until downtrending -Trend Lactic acid -Cardiology consulted, appreciate input -2D echocardiogram: LVEF 55 to [id number], grade 1 diastolic  dysfunction, LVH  Severe Sepsis secondary to Acute Pancreatitis & Gastroenteritis -Monitor fever curve -Trend WBCs and procalcitonin -Follow cultures as above -Received cefepime, Flagyl, vancomycin in ED; currently on meropenem and vancomycin -Chest x-ray shows retrocardiac infiltrate 4/17 -Urinalysis not concerning for UTI on 4/16 -CT abdomen & pelvis 4/16 concerning for gastroenteritis and questionable acute pancreatitis -General surgery was consulted, appreciate input, no surgical intervention necessary -Amylase & Lipase have normalized suspect more of a gastroenteritis process -NPO for now -Cautious IV Fluids -Pain control -Persistent lactic acidosis, concern for bowel ischemia, clinical exam does not support it  AKI Anion Gap Metabolic Acidosis -Monitor I&O's / urinary output -Follow BMP -Ensure adequate renal perfusion -Avoid nephrotoxic agents as able -Replace electrolytes as indicated -IV Fluids -appreciate input from nephrology -On CRRT  Acute Metabolic Encephalopathy secondary to Sepsis, Respiratory Failure -CT head and CT cervical spine 4/16 both negative for acute processes -Provide supportive care -Encourage normal sleep-wake cycle -Promote family presence -Treat sepsis, respiratory failure, tachycardia -Thiamine empirically -IV Haldol and low-dose Ativan as needed -Patient does not have history of alcohol or other substance abuse, withdrawal less likely  BEST PRACTICES: DISPOSITION: ICU GOALS OF CARE: DNR in the event of cardiopulmonary arrest, aggressive therapy otherwise VTE PROPHYLAXIS/ANTICOAGULATION: Heparin gtt UPDATES: Updated pt at bedside 4/17 and updated pt's daughter Marcie Bal [facility]) via telephone 09/12/19 CONSULTS: Hospitalist (primary service), Cardiology, Nephrology GI prophylaxis: Protonix    Critical care time devoted: 50 minutes.  Renold Don, MD [place] [facility]  09/13/2019, 8:10 PM   *This note was dictated using voice recognition  software/Dragon.  Despite best efforts to proofread, errors can occur which can change the meaning.  Any change was purely unintentional.

## 2019-09-13 NOTE — Progress Notes (Signed)
ANTICOAGULATION CONSULT NOTE -   Pharmacy Consult for Heparin Indication: A. Flutter and presumed PE   Allergies  Allergen Reactions  . Neomycin Anaphylaxis    blisters  . Lithium   . Meperidine Hcl     REACTION: UNSPECIFIED  . Pravastatin Sodium     Weakness, resolved after stopping medicine 2013  . Systane [Polyethyl Glycol-Propyl Glycol] Rash    Patient Measurements: Height: 5\' 2"  (157.5 cm) Weight: 79.3 kg (174 lb 13.2 oz) IBW/kg (Calculated) : 50.1 HEPARIN DW (KG): 67.1  Vital Signs: Temp: 98.7 F (37.1 C) (04/18 1600) Temp Source: Axillary (04/18 1600) BP: 131/40 (04/18 2030) Pulse Rate: 92 (04/18 2000)  Labs: Recent Labs    09/03/2019 1841 09/16/2019 2051 09/12/19 0446 09/12/19 0703 09/12/19 1733 09/13/19 0244 09/13/19 0319 09/13/19 0815 09/13/19 1136 09/13/19 1246 09/13/19 1611 09/13/19 1806 09/13/19 2008  HGB 16.3*   < > 14.2  --   --   --  12.3  --   --   --   --   --   --   HCT 48.0*  --  42.3  --   --   --  35.5*  --   --   --   --   --   --   PLT 298  --  234  --   --   --  234  --   --   --   --   --   --   APTT 34  --   --   --   --   --   --   --   --   --   --   --   --   LABPROT 15.5*  --  21.5*  --   --   --   --   --   --   --   --   --   --   INR 1.2  --  1.9*  --   --   --   --   --   --   --   --   --   --   HEPARINUNFRC  --   --   --    < >  --    < >  --   --  0.21* 0.12*  --   --  0.11*  CREATININE 3.12*   < > 3.32*   < > 3.49*  --  1.82*  --   --   --  1.19*  --   --   TROPONINIHS 648*   < >  --    < > 1,288*  --   --  542*  --   --   --  381*  --    < > = values in this interval not displayed.    Estimated Creatinine Clearance: 38 mL/min (A) (by C-G formula based on SCr of 1.19 mg/dL (H)).   Medical History: Past Medical History:  Diagnosis Date  . Agitation   . Allergic    Immunotherapy  . Allergic rhinitis   . ALLERGIC RHINITIS 10/26/2006   Qualifier: Diagnosis of  By: Council Mechanic MD, Hilaria Ota   . Anemia, iron deficiency    . Asthma   . Barrett's esophagus 01/04/2011  . Bipolar affective disorder ([place])   . BIPOLAR AFFECTIVE DISORDER, DEPRESSED, HX OF 10/25/2006   Qualifier: Diagnosis of  By: Zara Council LPN, Mariann Laster    . Blood in stool    Via stool cards  .  Blood transfusion without reported diagnosis   . Breast cancer ([place]) 1973   Bilateral Mastectomies  . Carpal tunnel syndrome   . Cervical cancer [facility])    Hysterectomy  . Chest pain   . Chronic kidney disease   . 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 Constipation    Mild  . Depressed   . Diarrhea   . DVT (deep venous thrombosis) ([place])   . Fibromyalgia   . Gastric ulcer    Acute  . Gastritis    Barretts, Stricture  . GERD (gastroesophageal reflux disease)   . Gross hematuria 06/23/2015  . History of kidney stones   . Hyperlipidemia   . Hypertension   . HYPERTENSION 10/26/2006   Qualifier: Diagnosis of  By: Council Mechanic MD, Hilaria Ota   . Hypothyroidism   . 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 Ophthalmoplegia    Dr. Nanine Means at [facility]  . Osteoarthritis    Dr. Marveen Reeks Dr. Jefm Bryant  . Osteoarthrosis    Dr. Marveen Reeks  . PAF (paroxysmal atrial fibrillation) ([place])   . Pain 01/2010   Declined by [facility] pain clinic  . Pneumonia    with trach / 2 YOA and 14 YOA  . PONV (postoperative nausea and vomiting)    past hx.-none recent  . Renal artery stenosis ([place]) 01/04/2011   S/p angioplasty of vessels x2   . Rheumatoid arthritis(714.0)    Dr. Jefm Bryant  . Sinusitis, acute 07/09/2012    Medications:  Medications Prior to Admission  Medication Sig Dispense Refill Last Dose  . acetaminophen (TYLENOL) 325 MG tablet Take 650 mg by mouth every 6 (six) hours as needed for mild pain or fever.    Unknown at PRN  . aspirin 81 MG tablet Take 81 mg by mouth daily.   24+ hours at Unknown  . baclofen (LIORESAL) 10 MG tablet Take 5 mg by mouth 2 (two) times daily.    24+ hours at Unknown  . Calcium Carbonate-Vitamin D (CALTRATE 600+D PO)  Take 1 tablet by mouth 2 (two) times a day.      . Cholecalciferol (D 5000) 125 MCG (5000 UT) capsule Take 5,000 Units by mouth daily.      . DULoxetine (CYMBALTA) 60 MG capsule Take 60 mg by mouth every morning.    24+ hours at Unknown  . fenofibrate (TRICOR) 145 MG tablet Take 145 mg by mouth daily.   24+ hours at Unknown  . gabapentin (NEURONTIN) 300 MG capsule Take 300 mg by mouth 3 (three) times daily.    24+ hours at Unknown  . lamoTRIgine (LAMICTAL) 25 MG tablet Take 50 mg by mouth at bedtime.    24+ hours at Unknown  . levothyroxine (SYNTHROID) 25 MCG tablet Take 25 mcg by mouth daily before breakfast.   24+ hours at Unknown  . losartan (COZAAR) 25 MG tablet Take 25 mg by mouth daily.    24+ hours at Unknown  . metoprolol tartrate (LOPRESSOR) 50 MG tablet Take 50 mg by mouth 2 (two) times daily.   24+ hours at Unknown  . Multiple Vitamin (MULTIVITAMIN) tablet Take 1 tablet by mouth daily.       . pantoprazole (PROTONIX) 40 MG tablet Take 40 mg by mouth 2 (two) times daily.    24+ hours at Unknown  . primidone (MYSOLINE) 50 MG tablet Take 50 mg by mouth 2 (two) times a day.    24+ hours at Unknown  . propranolol (INDERAL) 10 MG tablet  Take 10 mg by mouth 2 (two) times daily.   24+ hours at Unknown  . vitamin C (ASCORBIC ACID) 250 MG tablet Take 1,000 mg by mouth in the morning and at bedtime.        Assessment: Pharmacy consulted for heparin infusion dosing and monitor for atrial flutter and presumed PE in 79 yo female. Patient is admitted with acute metabolic encephalopathy and sepsis.  Baseline labs ordered. APTT WNL.  No anticoagulants per PTA med list.   4/16 Heparin infusion started @ 1200 units/hr 4/17  @ 1154 HL 0.71. infusion decreased to 1050 units/hr 4/17 @ 1900 heparin infusion restarted  4/18 @ 0244 HL 0.65. Level therapeutic x 1.    Goal of Therapy:  Heparin level 0.3-0.7 units/ml Monitor platelets by anticoagulation protocol: Yes   Plan:  4/18 @ 1136 HL: 0.21.   Level is subtherapeutic. RN to redraw level to confirm. Repeat level 0.12  Will order 1000 unit bolus and increase current infusion rate to 1150 units/hr   Recheck Check HL ~ 6 hours after rate adjustment Continue to monitor CBC daily per protocol.   4/18:  HL @ 2008 = 0.11 Will order Heparin 2000 units IV X 1 and increase drip rate to 1400 units/hr.  Will recheck HL 8 hrs after rate change.   Orene Desanctis, PharmD Clinical Pharmacist 09/13/2019 9:09 PM

## 2019-09-14 DIAGNOSIS — I248 Other forms of acute ischemic heart disease: Secondary | ICD-10-CM

## 2019-09-14 LAB — COMPREHENSIVE METABOLIC PANEL
ALT: 168 U/L — ABNORMAL HIGH (ref 0–44)
AST: 232 U/L — ABNORMAL HIGH (ref 15–41)
Albumin: 2.5 g/dL — ABNORMAL LOW (ref 3.5–5.0)
Alkaline Phosphatase: 79 U/L (ref 38–126)
Anion gap: 15 (ref 5–15)
BUN: 17 mg/dL (ref 8–23)
CO2: 19 mmol/L — ABNORMAL LOW (ref 22–32)
Calcium: 7.6 mg/dL — ABNORMAL LOW (ref 8.9–10.3)
Chloride: 103 mmol/L (ref 98–111)
Creatinine, Ser: 0.91 mg/dL (ref 0.44–1.00)
GFR calc Af Amer: >60 mL/min (ref >60)
GFR calc non Af Amer: >60 mL/min (ref >60)
Glucose, Bld: 129 mg/dL — ABNORMAL HIGH (ref 70–99)
Potassium: 4.5 mmol/L (ref 3.5–5.1)
Sodium: 137 mmol/L (ref 135–145)
Total Bilirubin: 1.4 mg/dL — ABNORMAL HIGH (ref 0.3–1.2)
Total Protein: 5.7 g/dL — ABNORMAL LOW (ref 6.5–8.1)

## 2019-09-14 LAB — RENAL FUNCTION PANEL
Albumin: 2.8 g/dL — ABNORMAL LOW (ref 3.5–5.0)
Anion gap: 17 — ABNORMAL HIGH (ref 5–15)
BUN: 15 mg/dL (ref 8–23)
CO2: 17 mmol/L — ABNORMAL LOW (ref 22–32)
Calcium: 7.6 mg/dL — ABNORMAL LOW (ref 8.9–10.3)
Chloride: 104 mmol/L (ref 98–111)
Creatinine, Ser: 0.93 mg/dL (ref 0.44–1.00)
GFR calc Af Amer: >60 mL/min (ref >60)
GFR calc non Af Amer: 59 mL/min — ABNORMAL LOW (ref >60)
Glucose, Bld: 125 mg/dL — ABNORMAL HIGH (ref 70–99)
Phosphorus: 1.4 mg/dL — ABNORMAL LOW (ref 2.5–4.6)
Potassium: 4.1 mmol/L (ref 3.5–5.1)
Sodium: 138 mmol/L (ref 135–145)

## 2019-09-14 LAB — CBC
HCT: 32.7 % — ABNORMAL LOW (ref 36.0–46.0)
Hemoglobin: 11.2 g/dL — ABNORMAL LOW (ref 12.0–15.0)
MCH: 32.7 pg (ref 26.0–34.0)
MCHC: 34.3 g/dL (ref 30.0–36.0)
MCV: 95.3 fL (ref 80.0–100.0)
Platelets: 174 10*3/uL (ref 150–400)
RBC: 3.43 MIL/uL — ABNORMAL LOW (ref 3.87–5.11)
RDW: 13.5 % (ref 11.5–15.5)
WBC: 16.4 10*3/uL — ABNORMAL HIGH (ref 4.0–10.5)
nRBC: 0.2 % (ref 0.0–0.2)

## 2019-09-14 LAB — CULTURE, BLOOD (ROUTINE X 2)

## 2019-09-14 LAB — LACTIC ACID, PLASMA: Lactic Acid, Venous: 1.7 mmol/L (ref 0.5–1.9)

## 2019-09-14 LAB — MAGNESIUM: Magnesium: 2.6 mg/dL — ABNORMAL HIGH (ref 1.7–2.4)

## 2019-09-14 LAB — CORTISOL-AM, BLOOD: Cortisol - AM: 76.1 ug/dL — ABNORMAL HIGH (ref 6.7–22.6)

## 2019-09-14 LAB — HEPARIN LEVEL (UNFRACTIONATED)
Heparin Unfractionated: 0.2 IU/mL — ABNORMAL LOW (ref 0.30–0.70)
Heparin Unfractionated: 0.45 IU/mL (ref 0.30–0.70)
Heparin Unfractionated: 0.38 IU/mL (ref 0.30–0.70)

## 2019-09-14 LAB — PROCALCITONIN: Procalcitonin: 70.63 ng/mL

## 2019-09-14 LAB — PHOSPHORUS: Phosphorus: 2.5 mg/dL (ref 2.5–4.6)

## 2019-09-14 MED ORDER — SODIUM CHLORIDE 0.9 % IV SOLN
1.0000 g | Freq: Two times a day (BID) | INTRAVENOUS | Status: DC
  Administered 2019-09-14: 1 g via INTRAVENOUS
  Filled 2019-09-14 (×2): qty 1

## 2019-09-14 MED ORDER — ALBUMIN HUMAN 25 % IV SOLN
12.5000 g | Freq: Once | INTRAVENOUS | Status: AC
  Administered 2019-09-14: 12.5 g via INTRAVENOUS
  Filled 2019-09-14: qty 50

## 2019-09-14 MED ORDER — VANCOMYCIN HCL IN DEXTROSE 1-5 GM/200ML-% IV SOLN
1000.0000 mg | INTRAVENOUS | Status: DC
  Administered 2019-09-14: 1000 mg via INTRAVENOUS
  Filled 2019-09-14 (×2): qty 200
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
## 2019-09-14 NOTE — Progress Notes (Signed)
[place] for Heparin Indication: A. Flutter and presumed PE   Allergies  Allergen Reactions  . Neomycin Anaphylaxis    blisters  . Lithium   . Meperidine Hcl     REACTION: UNSPECIFIED  . Pravastatin Sodium     Weakness, resolved after stopping medicine 2013  . Systane [Polyethyl Glycol-Propyl Glycol] Rash    Patient Measurements: Height: 5\' 2"  (157.5 cm) Weight: 77.7 kg (171 lb 4.8 oz) IBW/kg (Calculated) : 50.1 HEPARIN DW (KG): 67.1  Vital Signs: Temp: 98.6 F (37 C) (04/19 2000) Temp Source: Oral (04/19 2000) BP: 147/54 (04/19 2000) Pulse Rate: 125 (04/19 2000)  Labs: Recent Labs     0000 09/12/19 0446 09/12/19 0703 09/12/19 1733 09/13/19 0244 09/13/19 0319 09/13/19 0815 09/13/19 1136 09/13/19 1611 09/13/19 1806 09/13/19 2008 09/14/19 0327 09/14/19 1303 09/14/19 1644 09/14/19 2101  HGB  --  14.2   < >  --   --  12.3  --   --   --   --   --  11.2*  --   --   --   HCT  --  42.3  --   --   --  35.5*  --   --   --   --   --  32.7*  --   --   --   PLT  --  234  --   --   --  234  --   --   --   --   --  174  --   --   --   LABPROT  --  21.5*  --   --   --   --   --   --   --   --   --   --   --   --   --   INR  --  1.9*  --   --   --   --   --   --   --   --   --   --   --   --   --   HEPARINUNFRC  --   --    < >  --    < >  --   --    < >  --   --    < > 0.20* 0.45  --  0.38  CREATININE   < > 3.32*   < > 3.49*  --  1.82*  --   --  1.19*  --   --  0.91  --  0.93  --   TROPONINIHS  --   --    < > 1,288*  --   --  542*  --   --  381*  --   --   --   --   --    < > = values in this interval not displayed.    Estimated Creatinine Clearance: 48.1 mL/min (by C-G formula based on SCr of 0.93 mg/dL).   Medical History: Past Medical History:  Diagnosis Date  . Agitation   . Allergic    Immunotherapy  . Allergic rhinitis   . ALLERGIC RHINITIS 10/26/2006   Qualifier: Diagnosis of  By: Council Mechanic MD, Hilaria Ota   . Anemia,  iron deficiency   . Asthma   . Barrett's esophagus 01/04/2011  . Bipolar affective disorder ([place])   . BIPOLAR AFFECTIVE DISORDER, DEPRESSED, HX OF 10/25/2006   Qualifier: Diagnosis of  By: Zara Council LPN, Mariann Laster    .  Blood in stool    Via stool cards  . Blood transfusion without reported diagnosis   . Breast cancer ([place]) 1973   Bilateral Mastectomies  . Carpal tunnel syndrome   . Cervical cancer [facility])    Hysterectomy  . Chest pain   . Chronic kidney disease   . 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 Constipation    Mild  . Depressed   . Diarrhea   . DVT (deep venous thrombosis) ([place])   . Fibromyalgia   . Gastric ulcer    Acute  . Gastritis    Barretts, Stricture  . GERD (gastroesophageal reflux disease)   . Gross hematuria 06/23/2015  . History of kidney stones   . Hyperlipidemia   . Hypertension   . HYPERTENSION 10/26/2006   Qualifier: Diagnosis of  By: Council Mechanic MD, Hilaria Ota   . Hypothyroidism   . 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 Ophthalmoplegia    Dr. Nanine Means at [facility]  . Osteoarthritis    Dr. Marveen Reeks Dr. Jefm Bryant  . Osteoarthrosis    Dr. Marveen Reeks  . PAF (paroxysmal atrial fibrillation) ([place])   . Pain 01/2010   Declined by [facility] pain clinic  . Pneumonia    with trach / 2 YOA and 14 YOA  . PONV (postoperative nausea and vomiting)    past hx.-none recent  . Renal artery stenosis ([place]) 01/04/2011   S/p angioplasty of vessels x2   . Rheumatoid arthritis(714.0)    Dr. Jefm Bryant  . Sinusitis, acute 07/09/2012     Assessment: Pharmacy consulted for heparin infusion dosing and monitor for atrial flutter and presumed PE in 79 yo female. Patient is admitted with acute metabolic encephalopathy and sepsis.  Baseline labs ordered. APTT WNL.  No anticoagulants per PTA med list.    Goal of Therapy:  Heparin level 0.3-0.7 units/ml Monitor platelets by anticoagulation protocol: Yes   Plan:  4/19 2100 HL 0.38 therapeutic. Continue heparin  drip at 1600 units/hr. Will recheck HL w/ am labs and continue to monitor.  Tobie Lords, PharmD Clinical Pharmacist 09/14/2019 9:52 PM

## 2019-09-14 NOTE — Progress Notes (Signed)
[facility]  ROUNDING NOTE   Subjective:  Patient still critically ill at the moment. Still requiring pressor treatment. Tolerating CRRT well. Lactic acidosis has improved however.   Objective:  Vital signs in last 24 hours:  Temp:  [97.9 F (36.6 C)-98.7 F (37.1 C)] 98.2 F (36.8 C) (04/19 1200) Pulse Rate:  [87-113] 103 (04/19 1300) Resp:  [15-37] 19 (04/19 1300) BP: (69-195)/(35-159) 165/50 (04/19 1300) SpO2:  [85 %-96 %] 89 % (04/19 1300) FiO2 (%):  [65 %-70 %] 65 % (04/19 1141) Weight:  [77.7 kg] 77.7 kg (04/19 0318)  Weight change: -1.6 kg Filed Weights   09/12/19 0539 09/13/19 0316 09/14/19 0318  Weight: 77.7 kg 79.3 kg 77.7 kg    Intake/Output: I/O last 3 completed shifts: In: 3021.7 [I.V.:2121.4; IV Piggyback:900.3] Out: 4098 [Urine:500; Other:6381]   Intake/Output this shift:  Total I/O In: 320.9 [I.V.:320.9] Out: 412 [Urine:55; Other:357]  Physical Exam: General: Critically ill-appearing  Head: Normocephalic, atraumatic. Moist oral mucosal membranes  Eyes: Anicteric  Neck: Supple, trachea midline  Lungs:  Scattered rhonchi, normal effort  Heart: S1S2, tachycardic  Abdomen:  Mild diffuse tenderness, distention noted  Extremities: 2+ peripheral edema.  Neurologic: Lethargic at the moment  Skin: No lesions  Access: Left IJ temporary dialysis catheter    Basic Metabolic Panel: Recent Labs  Lab 09/10/2019 1841 09/12/19 0446 09/12/19 1005 09/12/19 1440 09/12/19 1440 09/12/19 1733 09/12/19 1733 09/13/19 0319 09/13/19 1611 09/14/19 0327  NA 139   < >  --  135  --  135  --  138 138 137  K 3.3*   < >  --  3.8  --  4.0  --  4.0 4.3 4.5  CL 102   < >  --  102  --  101  --  104 104 103  CO2 14*   < >  --  17*  --  16*  --  20* 22 19*  GLUCOSE 173*   < >  --  133*  --  136*  --  118* 119* 129*  BUN 51*   < >  --  64*  --  64*  --  36* 22 17  CREATININE 3.12*   < >  --  3.74*  --  3.49*  --  1.82* 1.19* 0.91  CALCIUM 8.0*   < >  --   6.2*   < > 6.2*   < > 6.7* 7.5* 7.6*  MG 2.5*  --  1.7  --   --   --   --  1.9  --  2.6*  PHOS  --   --  3.3  --   --  5.1*  --  3.4 2.8 2.5   < > = values in this interval not displayed.    Liver Function Tests: Recent Labs  Lab 09/25/2019 1841 09/14/2019 1841 09/12/19 0446 09/12/19 1733 09/13/19 0319 09/13/19 1611 09/14/19 0327  AST 78*  --  177*  --   --   --  232*  ALT 33  --  58*  --   --   --  168*  ALKPHOS 56  --  44  --   --   --  79  BILITOT 1.1  --  1.4*  --   --   --  1.4*  PROT 6.5  --  5.1*  --   --   --  5.7*  ALBUMIN 3.5   < > 2.7* 2.7* 2.8* 2.5* 2.5*   < > =  values in this interval not displayed.   Recent Labs  Lab 09/08/2019 2051 09/12/19 0446 09/12/19 0703 09/13/19 0319  LIPASE 517*  --  200* 49  AMYLASE  --  320*  --  98   No results for input(s): AMMONIA in the last 168 hours.  CBC: Recent Labs  Lab 09/08/2019 1841 09/12/19 0446 09/13/19 0319 09/14/19 0327  WBC 15.1* 9.5 11.5* 16.4*  NEUTROABS 12.7*  --   --   --   HGB 16.3* 14.2 12.3 11.2*  HCT 48.0* 42.3 35.5* 32.7*  MCV 96.6 98.4 94.2 95.3  PLT 298 234 234 174    Cardiac Enzymes: No results for input(s): CKTOTAL, CKMB, CKMBINDEX, TROPONINI in the last 168 hours.  BNP: Invalid input(s): POCBNP  CBG: Recent Labs  Lab 09/12/19 0102 09/12/19 1944  GLUCAP 129* 114*    Microbiology: Results for orders placed or performed during the hospital encounter of 09/03/2019  Blood culture (routine x 2)     Status: Abnormal   Collection Time: 09/10/2019  6:41 PM   Specimen: BLOOD  Result Value Ref Range Status   Specimen Description   Final    BLOOD RIGHT ARM Performed at [facility], [address], [place], [place] [address]    Special Requests   Final    BOTTLES DRAWN AEROBIC AND ANAEROBIC Blood Culture results may not be optimal due to an inadequate volume of blood received in culture bottles Performed at [facility], [address] [place], [place] [address]     Culture  Setup Time   Final    AEROBIC BOTTLE ONLY GRAM POSITIVE COCCI CRITICAL RESULT CALLED TO, READ BACK BY AND VERIFIED WITH: SCOTT HALL ON 09/12/19 AT 2210 [facility]    Culture (A)  Final    STAPHYLOCOCCUS SPECIES (COAGULASE NEGATIVE) THE SIGNIFICANCE OF ISOLATING THIS ORGANISM FROM A SINGLE SET OF BLOOD CULTURES WHEN MULTIPLE SETS ARE DRAWN IS UNCERTAIN. PLEASE NOTIFY THE MICROBIOLOGY DEPARTMENT WITHIN ONE WEEK IF SPECIATION AND SENSITIVITIES ARE REQUIRED. Performed at [facility], [place] [address] [place], [place] [address]    Report Status 09/14/2019 FINAL  Final  Blood Culture ID Panel (Reflexed)     Status: Abnormal   Collection Time: 09/12/2019  6:41 PM  Result Value Ref Range Status   Enterococcus species NOT DETECTED NOT DETECTED Final   Listeria monocytogenes NOT DETECTED NOT DETECTED Final   Staphylococcus species DETECTED (A) NOT DETECTED Final    Comment: Methicillin (oxacillin) susceptible coagulase negative staphylococcus. Possible blood culture contaminant (unless isolated from more than one blood culture draw or clinical case suggests pathogenicity). No antibiotic treatment is indicated for blood  culture contaminants. CRITICAL RESULT CALLED TO, READ BACK BY AND VERIFIED WITH: SCOTT HALL ON 09/12/19 AT 2210 [place]    Staphylococcus aureus (BCID) NOT DETECTED NOT DETECTED Final   Methicillin resistance NOT DETECTED NOT DETECTED Final   Streptococcus species NOT DETECTED NOT DETECTED Final   Streptococcus agalactiae NOT DETECTED NOT DETECTED Final   Streptococcus pneumoniae NOT DETECTED NOT DETECTED Final   Streptococcus pyogenes NOT DETECTED NOT DETECTED Final   Acinetobacter baumannii NOT DETECTED NOT DETECTED Final   Enterobacteriaceae species NOT DETECTED NOT DETECTED Final   Enterobacter cloacae complex NOT DETECTED NOT DETECTED Final   Escherichia coli NOT DETECTED NOT DETECTED Final   Klebsiella oxytoca NOT DETECTED NOT DETECTED Final   Klebsiella pneumoniae NOT  DETECTED NOT DETECTED Final   Proteus species NOT DETECTED NOT DETECTED Final   Serratia marcescens  NOT DETECTED NOT DETECTED Final   Haemophilus influenzae NOT DETECTED NOT DETECTED Final   Neisseria meningitidis NOT DETECTED NOT DETECTED Final   Pseudomonas aeruginosa NOT DETECTED NOT DETECTED Final   Candida albicans NOT DETECTED NOT DETECTED Final   Candida glabrata NOT DETECTED NOT DETECTED Final   Candida krusei NOT DETECTED NOT DETECTED Final   Candida parapsilosis NOT DETECTED NOT DETECTED Final   Candida tropicalis NOT DETECTED NOT DETECTED Final    Comment: Performed at [facility], [address] [place], [place] [address]  Respiratory Panel by RT PCR (Flu A&B, Covid) - Nasopharyngeal Swab     Status: None   Collection Time: 09/07/2019  7:14 PM   Specimen: Nasopharyngeal Swab  Result Value Ref Range Status   SARS Coronavirus 2 by RT PCR NEGATIVE NEGATIVE Final    Comment: (NOTE) SARS-CoV-2 target nucleic acids are NOT DETECTED. The SARS-CoV-2 RNA is generally detectable in upper respiratoy specimens during the acute phase of infection. The lowest concentration of SARS-CoV-2 viral copies this assay can detect is 131 copies/mL. A negative result does not preclude SARS-Cov-2 infection and should not be used as the sole basis for treatment or other patient management decisions. A negative result may occur with  improper specimen collection/handling, submission of specimen other than nasopharyngeal swab, presence of viral mutation(s) within the areas targeted by this assay, and inadequate number of viral copies (<131 copies/mL). A negative result must be combined with clinical observations, patient history, and epidemiological information. The expected result is Negative. Fact Sheet for Patients:  PinkCheek.be Fact Sheet for Healthcare Providers:  GravelBags.it This test is not yet ap proved or cleared by the  [place] FDA and  has been authorized for detection and/or diagnosis of SARS-CoV-2 by FDA under an Emergency Use Authorization (EUA). This EUA will remain  in effect (meaning this test can be used) for the duration of the COVID-19 declaration under Section 564(b)(1) of the Act, 21 U.S.C. section 360bbb-3(b)(1), unless the authorization is terminated or revoked sooner.    Influenza A by PCR NEGATIVE NEGATIVE Final   Influenza B by PCR NEGATIVE NEGATIVE Final    Comment: (NOTE) The Xpert Xpress SARS-CoV-2/FLU/RSV assay is intended as an aid in  the diagnosis of influenza from Nasopharyngeal swab specimens and  should not be used as a sole basis for treatment. Nasal washings and  aspirates are unacceptable for Xpert Xpress SARS-CoV-2/FLU/RSV  testing. Fact Sheet for Patients: PinkCheek.be Fact Sheet for Healthcare Providers: GravelBags.it This test is not yet approved or cleared by the [place] FDA and  has been authorized for detection and/or diagnosis of SARS-CoV-2 by  FDA under an Emergency Use Authorization (EUA). This EUA will remain  in effect (meaning this test can be used) for the duration of the  Covid-19 declaration under Section 564(b)(1) of the Act, 21  U.S.C. section 360bbb-3(b)(1), unless the authorization is  terminated or revoked. Performed at [facility], [address] [place], [place] [address]   Blood culture (routine x 2)     Status: None (Preliminary result)   Collection Time: 08/28/2019  7:35 PM   Specimen: BLOOD  Result Value Ref Range Status   Specimen Description BLOOD RIGHT ANTECUBITAL  Final   Special Requests   Final    BOTTLES DRAWN AEROBIC AND ANAEROBIC Blood Culture results may not be optimal due to an inadequate volume of blood received in culture bottles   Culture   Final    NO GROWTH 3 DAYS  Performed at [facility], [address] [place], [place] [address]     Report Status PENDING  Incomplete  Urine culture     Status: None   Collection Time: 09/10/2019 11:51 PM   Specimen: Urine, Random  Result Value Ref Range Status   Specimen Description   Final    URINE, RANDOM Performed at [facility], [address] [place], [place] [address]    Special Requests   Final    NONE Performed at [facility], [address] [place], [place] [address]    Culture   Final    NO GROWTH Performed at [facility], [place] [address] [place], [place] [address]    Report Status 09/13/2019 FINAL  Final  MRSA PCR Screening     Status: None   Collection Time: 09/12/19  1:07 AM   Specimen: Nasopharyngeal  Result Value Ref Range Status   MRSA by PCR NEGATIVE NEGATIVE Final    Comment:        The GeneXpert MRSA Assay (FDA approved for NASAL specimens only), is one component of a comprehensive MRSA colonization surveillance program. It is not intended to diagnose MRSA infection nor to guide or monitor treatment for MRSA infections. Performed at [facility], [address] [place], [place] [address]   CULTURE, BLOOD (ROUTINE X 2) w Reflex to ID Panel     Status: None (Preliminary result)   Collection Time: 09/13/19  8:37 PM   Specimen: BLOOD LEFT HAND  Result Value Ref Range Status   Specimen Description BLOOD LEFT HAND  Final   Special Requests   Final    BOTTLES DRAWN AEROBIC AND ANAEROBIC Blood Culture adequate volume   Culture   Final    NO GROWTH < 12 HOURS Performed at [facility], [address], [place], [place] [address]    Report Status PENDING  Incomplete  CULTURE, BLOOD (ROUTINE X 2) w Reflex to ID Panel     Status: None (Preliminary result)   Collection Time: 09/13/19  8:52 PM   Specimen: BLOOD RIGHT HAND  Result Value Ref Range Status   Specimen Description BLOOD RIGHT HAND  Final   Special Requests   Final    BOTTLES DRAWN AEROBIC AND ANAEROBIC Blood Culture results may not be optimal  due to an inadequate volume of blood received in culture bottles   Culture   Final    NO GROWTH < 12 HOURS Performed at [facility], [address] [place], [place] [address]    Report Status PENDING  Incomplete    Coagulation Studies: Recent Labs    09/17/2019 1841 09/12/19 0446  LABPROT 15.5* 21.5*  INR 1.2 1.9*    Urinalysis: Recent Labs    09/06/2019 2351  COLORURINE AMBER*  LABSPEC 1.017  PHURINE 5.0  GLUCOSEU >=500*  HGBUR NEGATIVE  BILIRUBINUR NEGATIVE  KETONESUR NEGATIVE  PROTEINUR 100*  NITRITE NEGATIVE  LEUKOCYTESUR NEGATIVE      Imaging: DG Chest Port 1 View  Result Date: 09/12/2019 CLINICAL DATA:  Left central line placement EXAM: PORTABLE CHEST 1 VIEW COMPARISON:  09/12/2019 at 9:14 a.m. FINDINGS: Dual lumen left central line tip: Right atrium. No pneumothorax complicating feature observed. Low lung volumes. Mild bibasilar retro diaphragmatic/retrocardiac densities potentially from atelectasis, pneumonia, or layering effusion. No edema noted. IMPRESSION: 1. Dual lumen left central line tip: Right atrium. No pneumothorax or complicating feature observed. 2. Mild bibasilar retrodiaphragmatic/retrocardiac densities potentially from atelectasis, pneumonia, or layering effusion.  Electronically Signed   By: Van Clines M.D.   On: 09/12/2019 17:55     Medications:   .  prismasol BGK 4/2.5 500 mL/hr at 09/14/19 1008  .  prismasol BGK 4/2.5 500 mL/hr at 09/14/19 1008  . sodium chloride 250 mL (09/12/19 0430)  . heparin 1,600 Units/hr (09/14/19 1300)  . meropenem (MERREM) IV 1 g (09/14/19 0506)  . norepinephrine (LEVOPHED) Adult infusion 16 mcg/min (09/14/19 1300)  . prismasol BGK 4/2.5 2,000 mL/hr at 09/14/19 1134  . vancomycin    . vasopressin (PITRESSIN) infusion - *FOR SHOCK* 0.03 Units/min (09/14/19 1300)   . Chlorhexidine Gluconate Cloth  6 each Topical Daily  . hydrocortisone sod succinate (SOLU-CORTEF) inj  100 mg Intravenous Q8H  .  pantoprazole (PROTONIX) IV  40 mg Intravenous Q24H  . thiamine injection  100 mg Intravenous Q24H   acetaminophen **OR** acetaminophen, fentaNYL (SUBLIMAZE) injection, haloperidol lactate, heparin, LORazepam, ondansetron **OR** ondansetron (ZOFRAN) IV  Assessment/ Plan:  79 y.o. female past history of degenerative disc disease, breast cancer, cervical cancer status post hysterectomy, depression, GERD, hyperlipidemia, hypertension, hypothyroidism, osteophytes, urinary incontinence admitted with nausea, vomiting, diarrhea and found to have generalized enteritis along with pancreatitis.  1.  Acute kidney injury secondary to acute tubular necrosis/chronic kidney disease stage IIIa baseline EGFR 59.  Patient appears to have severe acute kidney injury now likely related to intraabdominal process and hypotension.  -Urine output remains low at 270 cc over the preceding 24 hours.  Maintain the patient on CRRT at the moment.  Serum electrolytes acceptable.  2.  Hypotension.  Continue norepinephrine and vasopressin.  3.  Metabolic acidosis.  Lactic acid now down to 1.7 and much better than yesterday..   LOS: 3   4/19/20211:40 PM

## 2019-09-14 NOTE — TOC Initial Note (Addendum)
Transition of Care [facility]) - Initial/Assessment Note    Patient Details  Name: Olivia [name] MRN: [id number] Date of Birth: 01/17/1941  [facility]) CM/SW Contact:    Magnus Ivan, LCSW Phone Number: 09/14/2019, 1:13 PM  Clinical Narrative:              Per notes, patient is disoriented and lethargic today. CSW called patient's daughter and POA, Marcie Bal, to complete Readmission Screening. Marcie Bal reported patient lives at home with her significant other and drives herself to appointments. Pharmacy is [facility] in [place] denied issues with obtaining medications for patient. Marcie Bal reported patient often changes her PCP but she thinks her most recent PCP was Evern Bio, NP. Patient has a walker at home that she uses when she feels unsteady. No HH or SNF history per Marcie Bal. CSW will continue to follow and encouraged Marcie Bal to reach out if she or the patient have any questions or needs.   Expected Discharge Plan: Home/Self Care Barriers to Discharge: Continued Medical Work up   Patient Goals and CMS Choice        Expected Discharge Plan and Services Expected Discharge Plan: Home/Self Care       Living arrangements for the past 2 months: Single Family Home                                      Prior Living Arrangements/Services Living arrangements for the past 2 months: Single Family Home Lives with:: Significant Other Patient language and need for interpreter reviewed:: Yes        Need for Family Participation in Patient Care: Yes (Comment) Care giver support system in place?: Yes (comment) Current home services: DME Criminal Activity/Legal Involvement Pertinent to Current Situation/Hospitalization: No - Comment as needed  Activities of Daily Living Home Assistive Devices/Equipment: Walker (specify type) ADL Screening (condition at time of admission) Patient's cognitive ability adequate to safely complete daily activities?: Yes Is the patient  deaf or have difficulty hearing?: Yes Does the patient have difficulty seeing, even when wearing glasses/contacts?: No Does the patient have difficulty concentrating, remembering, or making decisions?: No Patient able to express need for assistance with ADLs?: Yes Does the patient have difficulty dressing or bathing?: No Independently performs ADLs?: Yes (appropriate for developmental age) Does the patient have difficulty walking or climbing stairs?: No Weakness of Legs: Both Weakness of Arms/Hands: None  Permission Sought/Granted                  Emotional Assessment   Attitude/Demeanor/Rapport: Unable to Assess Affect (typically observed): Unable to Assess Orientation: : Fluctuating Orientation (Suspected and/or reported Sundowners)(Patient disoriented & sleepy per chart.) Alcohol / Substance Use: Not Applicable Psych Involvement: No (comment)  Admission diagnosis:  Atrial fibrillation with rapid ventricular response (HCC) [I48.91] Atrial flutter with rapid ventricular response (HCC) [I48.92] Acute respiratory failure with hypoxia ([place]) [J96.01] AKI (acute kidney injury) ([place]) [N17.9] Severe sepsis ([place]) [A41.9, R65.20] Sepsis, due to unspecified organism, unspecified whether acute organ dysfunction present [facility]) [A41.9] Patient Active Problem List   Diagnosis Date Noted  . Sepsis ([place]) 09/12/2019  . Acute gastroenteritis 09/21/2019  . AKI (acute kidney injury) ([place]) 09/07/2019  . Acute respiratory failure with hypoxia ([place]) 09/25/2019  . Metabolic acidosis A999333  . Acute metabolic encephalopathy A999333  . Atrial flutter with rapid ventricular response ([place]) 08/31/2019  . Elevated troponin 09/25/2019  . GERD (gastroesophageal  reflux disease) 12/04/2018  . Chest pain 12/04/2018  . Gross hematuria 06/23/2015  . Stress incontinence, female 06/23/2015  . Urge incontinence 06/23/2015  . 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 Cervical cancer ([place]) 06/12/2015  . Vaginal atrophy 06/12/2015  . Unstable bladder 06/12/2015  . Stress incontinence 06/12/2015  . Abdominal pain, other specified site 05/08/2013  . Incisional hernia, without obstruction or gangrene 05/08/2013  . Laceration of thumb 08/20/2012  . Sinusitis, acute 07/09/2012  . Foot pain 03/28/2012  . Palpitations 12/25/2011  . Barrett's esophagus 01/04/2011  . 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 OA (osteoarthritis) 01/04/2011  . Renal artery stenosis ([place]) 01/04/2011  . PAIN IN SOFT TISSUES OF LIMB 02/23/2010  . Eczema 02/07/2010  . BREAST CANCER 09/02/2009  . DYSURIA 07/12/2009  . FATIGUE 04/05/2009  . Diarrhea 09/28/2008  . CHEST PAIN 07/12/2008  . AGITATION 12/11/2007  . ANEMIA, IRON DEFICIENCY 10/28/2006  . CONSTIPATION, MILD 10/28/2006  . Hypothyroidism 10/26/2006  . Mixed hyperlipidemia 10/26/2006  . Hypertension 10/26/2006  . ALLERGIC RHINITIS 10/26/2006  . GASTRIC ULCER, ACUTE 10/25/2006  . FIBROMYALGIA 10/25/2006  . BIPOLAR AFFECTIVE DISORDER, DEPRESSED, HX OF 10/25/2006   PCP:  Danelle Berry, NP Pharmacy:   [facility], [place] [address] Phone: [phone] Fax: [facility] [place] [address] Phone: [phone] Fax: [phone]     Social Determinants of Health (SDOH) Interventions    Readmission Risk Interventions Readmission Risk Prevention Plan 09/14/2019  Transportation Screening Complete  Medication Review ([place]) Complete  PCP or Specialist appointment within 3-5 days of discharge Complete  HRI or [place] Complete  Some recent data might be hidden

## 2019-09-14 NOTE — Consult Note (Signed)
PHARMACY CONSULT NOTE - FOLLOW UP  Pharmacy Consult for Electrolyte Monitoring and Replacement   Recent Labs: Potassium (mmol/L)  Date Value  09/14/2019 4.5  08/29/2012 3.9   Magnesium (mg/dL)  Date Value  09/14/2019 2.6 (H)   Calcium (mg/dL)  Date Value  09/14/2019 7.6 (L)   Calcium, Total (mg/dL)  Date Value  08/29/2012 8.9   Albumin (g/dL)  Date Value  09/14/2019 2.5 (L)  08/29/2012 4.0   Phosphorus (mg/dL)  Date Value  09/14/2019 2.5   Sodium (mmol/L)  Date Value  09/14/2019 137  08/29/2012 142   Corrected Calcium: 8.8   Assessment: Pharmacy consulted for electrolyte monitoring and replacement for 79 yo female admitted with AKI. Patient initiated on CRRT 4/17.   Goal of Therapy:  Electrolytes WNL   Plan:  No replacement indicated at this time. Will defer electrolyte monitoring and replacement to Nephrology in setting of CRRT.    Tawnya Crook, PharmD Clinical Pharmacist 09/14/2019 11:01 AM

## 2019-09-14 NOTE — Progress Notes (Signed)
Name: Olivia [name] MRN: [id number] DOB: 21-Apr-1941    ADMISSION DATE:  09/02/2019 CONSULTATION DATE:  09/12/2019  REFERRING MD :  Rufina Falco, NP  CHIEF COMPLAINT:  Unresponsiveness  BRIEF PATIENT DESCRIPTION:  79 y.o. Female, DNI, admitted 09/12/19 with Acute Hypoxic Respiratory Failure secondary to ? PE, severe metabolic acidosis, SVT vs. Atrial flutter w/ RVR, elevated troponin (NSTEMI vs. Demand ischemia), Cardiogenic shock (due to rapid rate) & septic shock (in setting of acute pancreatitis & Gastroenteritis), and AKI.  She has been placed on Bicarb gtt, Heparin & Amiodarone gtts.  Cardiology & Nephrology are consulted.  SIGNIFICANT EVENTS  4/17: Admission to [place] 4/17: DNR in event of cardiopulm arrest but otherwise treat 4/17: Critically ill ~ SVT vs. A.flutter w/ RVR, Resp Failure, shock, AKI.  PCCM, Cardiology, & Nephrology consulted  4/17: Adenosine 6 mg IV x1 dose without noted improvement in HR; Cardizem d/c and placed on Amiodarone gtt 4/17: Persistent RVR despite Amio, trial of beta blocker with good response, esmolol gtt, amio d/c'd  STUDIES:  4/16: CXR>> Cardiac shadows within normal limits. Lungs are well aerated bilaterally. No focal infiltrate or sizable effusion is seen. No bony abnormality is noted. 4/16: CT Head w/o contrast>>No acute intracranial process. 4/16: CT Cervical Spine>> Extensive multilevel cervical degenerative changes. No acute fracture. 4/16: CT Abdomen & Pelvis>>1. Fluid within the distal esophagus, throughout prominent smallbowel, cecum and ascending colon with associated mesenteric edema. Overall findings consistent with generalized enteritis. Wall thickening of the duodenum proximal jejunum. 2. Mild peripancreatic fat stranding and edema most prominent about the head and uncinate process, can be seen with acute pancreatitis in the appropriate clinical setting. Recommend correlation with pancreatic enzymes. 3. Small volume  abdominopelvic ascites. Generalized mesenteric and intra-abdominal edema. Trace pleural effusions/pleural thickening. 4. 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 5. Stable 2.1 cm left ovarian cyst dating back to 2017, likely benign. 6. Additional stable chronic findings as described.  4/17: Venous US Bilateral LE>> No DVT  CULTURES: SARS-CoV-2 PCR 4/16>> negative Influenza PCR 4/16>> negative Blood culture x2 4/16>> MSSA x1 only ? contam. Urine 4/16>>negative MRSA PCR 4/17>> negative GI panel PCR 4/16>> Blood cultures x2 4/18>>  ANTIBIOTICS: Cefepime 4/16>> 4/17 Flagyl 4/16>> 4/17 Vancomycin x1 dose in ED 4/16 Zosyn 4/17>> 4/17 Meropenem >> 4/17 Vanco >> 4/17   .  prismasol BGK 4/2.5 500 mL/hr at 09/14/19 1008  .  prismasol BGK 4/2.5 500 mL/hr at 09/14/19 1008  . sodium chloride 250 mL (09/12/19 0430)  . heparin 1,600 Units/hr (09/14/19 1500)  . meropenem (MERREM) IV    . norepinephrine (LEVOPHED) Adult infusion 16 mcg/min (09/14/19 1500)  . prismasol BGK 4/2.5 2,000 mL/hr at 09/14/19 1402  . vancomycin    . vasopressin (PITRESSIN) infusion - *FOR SHOCK* 0.03 Units/min (09/14/19 1500)      Allergies  Allergen Reactions  . Neomycin Anaphylaxis    blisters  . Lithium   . Meperidine Hcl     REACTION: UNSPECIFIED  . Pravastatin Sodium     Weakness, resolved after stopping medicine 2013  . Systane [Polyethyl Glycol-Propyl Glycol] Rash   .  REVIEW OF SYSTEMS: Unable to obtain due to patient's encephalopathy  SUBJECTIVE:  Patient is very confused, pulling at lines test to have mittens to prevent pulling of lines particularly dialysis catheter. Occasionally more lucid.  States abdominal pain better.  No other complaints reported.  Fidgety in bed.   VITAL SIGNS: Temp:  [97.9 F (36.6 C)-98.7 F (37.1 C)] 98.2 F (36.8  C) (04/19 1200) Pulse Rate:  [87-111] 103 (04/19 1300) Resp:  [15-37] 19 (04/19 1300) BP: (69-195)/(35-159) 165/50 (04/19 1300) SpO2:  [85  %-96 %] 89 % (04/19 1300) FiO2 (%):  [65 %-70 %] 65 % (04/19 1141) Weight:  [77.7 kg] 77.7 kg (04/19 0318)  PHYSICAL EXAMINATION: General:  Critically ill appearing female, laying in bed, on HFNC, with mild respiratory distress Neuro:  Awake, confused, no focal deficits, speech garbled HEENT:  Atraumatic, normocephalic, neck supple, no JVD, left IJ Trialysis catheter Cardiovascular:  Tachycardia, regular rhythm, 1+ pulses, cool extremities though less dusky, no edema Lungs:  Clear to auscultation bilaterally, no wheezing or rales, tachycpnea, no overt accessory muscle use Abdomen:  Soft, non-tender to palpitation, mildly distended, no guarding or rebound tenderness Musculoskeletal:  Generalized weakness, no deformities, no edema Skin:  Cool and dry.  No obvious rashes, lesions, or ulcerations  Recent Labs  Lab 09/13/19 0319 09/13/19 1611 09/14/19 0327  NA 138 138 137  K 4.0 4.3 4.5  CL 104 104 103  CO2 20* 22 19*  BUN 36* 22 17  CREATININE 1.82* 1.19* 0.91  GLUCOSE 118* 119* 129*   Recent Labs  Lab 09/12/19 0446 09/13/19 0319 09/14/19 0327  HGB 14.2 12.3 11.2*  HCT 42.3 35.5* 32.7*  WBC 9.5 11.5* 16.4*  PLT 234 234 174   DG Chest Port 1 View  Result Date: 09/12/2019 CLINICAL DATA:  Left central line placement EXAM: PORTABLE CHEST 1 VIEW COMPARISON:  09/12/2019 at 9:14 a.m. FINDINGS: Dual lumen left central line tip: Right atrium. No pneumothorax complicating feature observed. Low lung volumes. Mild bibasilar retro diaphragmatic/retrocardiac densities potentially from atelectasis, pneumonia, or layering effusion. No edema noted. IMPRESSION: 1. Dual lumen left central line tip: Right atrium. No pneumothorax or complicating feature observed. 2. Mild bibasilar retrodiaphragmatic/retrocardiac densities potentially from atelectasis, pneumonia, or layering effusion. Electronically Signed   By: Van Clines M.D.   On: 09/12/2019 17:55    ASSESSMENT / PLAN:  Acute Hypoxic  Respiratory Failure in setting of metabolic acidosis,  SVT/A.flutter w/ RVR Possible aspiration pneumonia (retrocardiac infiltrate) -Supplemental O2 as needed to maintain O2 sats greater than 92% -High flow O2 as needed -Patient is DNR but agreed to CRRT -Follow intermittent chest x-ray and ABG as needed -CXR on 4/17 with retrocardiac densities potentially atelectasis versus pneumonia -PRN bronchodilators -IV meropenem, added vancomycin -Heparin gtt -Lower extremity Dopplers negative for DVT  Cardiogenic shock (secondary to rapid HR) + Septic Shock (secondary to Acute Pancreatitis & Gastroenteritis) SVT vs. Atrial Flutter w/ RVR Elevated troponin in setting of demand ischemia Chronic HFpEF, currently without acute exacerbation -Continuous cardiac monitoring -Maintain MAP greater than 65 -Cautious IV fluids given HFpEF and AKI -Norepinephrine plus vasopressin as needed to maintain map goal -On esmolol infusion to control heart rate, so far tolerating -Heparin drip for anticoagulation -Trend troponin until downtrending -Trend Lactic acid -Cardiology consulted, appreciate input -2D echocardiogram: LVEF 55 to [id number], grade 1 diastolic dysfunction, LVH  Severe Sepsis secondary to Acute Pancreatitis & Gastroenteritis -Monitor fever curve -Trend WBCs and procalcitonin -Follow cultures as above -Received cefepime, Flagyl, vancomycin in ED; currently on meropenem and vancomycin -Chest x-ray shows retrocardiac infiltrate 4/17 -Urinalysis not concerning for UTI on 4/16 -CT abdomen & pelvis 4/16 concerning for gastroenteritis and questionable acute pancreatitis -General surgery was consulted, appreciate input, no surgical intervention necessary -Amylase & Lipase have normalized suspect more of a gastroenteritis process -NPO for now -Cautious IV Fluids -Pain control -Persistent lactic acidosis, concern for bowel ischemia,  clinical exam does not support it  AKI Anion Gap Metabolic  Acidosis -Monitor I&O's / urinary output -Follow BMP -Ensure adequate renal perfusion -Avoid nephrotoxic agents as able -Replace electrolytes as indicated -IV Fluids -appreciate input from nephrology -On CRRT  Acute Metabolic Encephalopathy secondary to Sepsis, Respiratory Failure -CT head and CT cervical spine 4/16 both negative for acute processes -Provide supportive care -Encourage normal sleep-wake cycle -Promote family presence -Treat sepsis, respiratory failure, tachycardia -Thiamine empirically -IV Haldol and low-dose Ativan as needed -Patient does not have history of alcohol or other substance abuse, withdrawal less likely  BEST PRACTICES: DISPOSITION: ICU GOALS OF CARE: DNR in the event of cardiopulmonary arrest, aggressive therapy otherwise VTE PROPHYLAXIS/ANTICOAGULATION: Heparin gtt UPDATES: Updated pt at bedside 4/17 and updated pt's daughter Marcie Bal [facility]) via telephone 09/12/19 CONSULTS: Hospitalist (primary service), Cardiology, Nephrology GI prophylaxis: Protonix    Critical care provider statement:    Critical care time (minutes):  34   Critical care time was exclusive of:  Separately billable procedures and  treating other patients   Critical care was necessary to treat or prevent imminent or  life-threatening deterioration of the following conditions:  acute hypoxemic respiratory failure, AKI, multiple comorbid conditions   Critical care was time spent personally by me on the following  activities:  Development of treatment plan with patient or surrogate,  discussions with consultants, evaluation of patient's response to  treatment, examination of patient, obtaining history from patient or  surrogate, ordering and performing treatments and interventions, ordering  and review of laboratory studies and re-evaluation of patient's condition   I assumed direction of critical care for this patient from another  provider in my specialty: no    09/14/2019, 3:20  PM   *This note was dictated using voice recognition software/Dragon.  Despite best efforts to proofread, errors can occur which can change the meaning.  Any change was purely unintentional.    Ottie Glazier, M.D.  [facility] [place]

## 2019-09-14 NOTE — Progress Notes (Signed)
[place] for Heparin Indication: A. Flutter and presumed PE   Allergies  Allergen Reactions  . Neomycin Anaphylaxis    blisters  . Lithium   . Meperidine Hcl     REACTION: UNSPECIFIED  . Pravastatin Sodium     Weakness, resolved after stopping medicine 2013  . Systane [Polyethyl Glycol-Propyl Glycol] Rash    Patient Measurements: Height: 5\' 2"  (157.5 cm) Weight: 77.7 kg (171 lb 4.8 oz) IBW/kg (Calculated) : 50.1 HEPARIN DW (KG): 67.1  Vital Signs: Temp: 98.2 F (36.8 C) (04/19 1200) Temp Source: Axillary (04/19 1200) BP: 165/50 (04/19 1300) Pulse Rate: 103 (04/19 1300)  Labs: Recent Labs    09/24/2019 1841 09/17/2019 2051 09/12/19 0446 09/12/19 0703 09/12/19 1733 09/13/19 0244 09/13/19 0319 09/13/19 0815 09/13/19 1136 09/13/19 1246 09/13/19 1611 09/13/19 1806 09/13/19 2008 09/14/19 0327 09/14/19 1303  HGB 16.3*   < > 14.2   < >  --   --  12.3  --   --   --   --   --   --  11.2*  --   HCT 48.0*   < > 42.3  --   --   --  35.5*  --   --   --   --   --   --  32.7*  --   PLT 298   < > 234  --   --   --  234  --   --   --   --   --   --  174  --   APTT 34  --   --   --   --   --   --   --   --   --   --   --   --   --   --   LABPROT 15.5*  --  21.5*  --   --   --   --   --   --   --   --   --   --   --   --   INR 1.2  --  1.9*  --   --   --   --   --   --   --   --   --   --   --   --   HEPARINUNFRC  --   --   --    < >  --    < >  --   --    < >   < >  --   --  0.11* 0.20* 0.45  CREATININE 3.12*   < > 3.32*   < > 3.49*  --  1.82*  --   --   --  1.19*  --   --  0.91  --   TROPONINIHS 648*   < >  --    < > 1,288*  --   --  542*  --   --   --  381*  --   --   --    < > = values in this interval not displayed.    Estimated Creatinine Clearance: 49.1 mL/min (by C-G formula based on SCr of 0.91 mg/dL).   Medical History: Past Medical History:  Diagnosis Date  . Agitation   . Allergic    Immunotherapy  . Allergic rhinitis   .  ALLERGIC RHINITIS 10/26/2006   Qualifier: Diagnosis of  By: Council Mechanic MD, Hilaria Ota   . Anemia, iron  deficiency   . Asthma   . Barrett's esophagus 01/04/2011  . Bipolar affective disorder ([place])   . BIPOLAR AFFECTIVE DISORDER, DEPRESSED, HX OF 10/25/2006   Qualifier: Diagnosis of  By: Zara Council LPN, Mariann Laster    . Blood in stool    Via stool cards  . Blood transfusion without reported diagnosis   . Breast cancer ([place]) 1973   Bilateral Mastectomies  . Carpal tunnel syndrome   . Cervical cancer [facility])    Hysterectomy  . Chest pain   . Chronic kidney disease   . 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 Constipation    Mild  . Depressed   . Diarrhea   . DVT (deep venous thrombosis) ([place])   . Fibromyalgia   . Gastric ulcer    Acute  . Gastritis    Barretts, Stricture  . GERD (gastroesophageal reflux disease)   . Gross hematuria 06/23/2015  . History of kidney stones   . Hyperlipidemia   . Hypertension   . HYPERTENSION 10/26/2006   Qualifier: Diagnosis of  By: Council Mechanic MD, Hilaria Ota   . Hypothyroidism   . 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 Ophthalmoplegia    Dr. Nanine Means at [facility]  . Osteoarthritis    Dr. Marveen Reeks Dr. Jefm Bryant  . Osteoarthrosis    Dr. Marveen Reeks  . PAF (paroxysmal atrial fibrillation) ([place])   . Pain 01/2010   Declined by [facility] pain clinic  . Pneumonia    with trach / 2 YOA and 14 YOA  . PONV (postoperative nausea and vomiting)    past hx.-none recent  . Renal artery stenosis ([place]) 01/04/2011   S/p angioplasty of vessels x2   . Rheumatoid arthritis(714.0)    Dr. Jefm Bryant  . Sinusitis, acute 07/09/2012     Assessment: Pharmacy consulted for heparin infusion dosing and monitor for atrial flutter and presumed PE in 79 yo female. Patient is admitted with acute metabolic encephalopathy and sepsis.  Baseline labs ordered. APTT WNL.  No anticoagulants per PTA med list.    Goal of Therapy:  Heparin level 0.3-0.7 units/ml Monitor  platelets by anticoagulation protocol: Yes   Plan:  4/19 1303 HL 0.45 therapeutic x 1. Continue heparin drip at 1600 units/hr. Recheck HL at 2100. CBC daily while on heparin drip.  Tawnya Crook, PharmD Clinical Pharmacist 09/14/2019 2:34 PM

## 2019-09-14 NOTE — Progress Notes (Signed)
Progress Note  Patient Name: Olivia [name] Date of Encounter: 09/14/2019  Primary Cardiologist: End  Subjective   Sleepy  Inpatient Medications    Scheduled Meds: . Chlorhexidine Gluconate Cloth  6 each Topical Daily  . hydrocortisone sod succinate (SOLU-CORTEF) inj  100 mg Intravenous Q8H  . pantoprazole (PROTONIX) IV  40 mg Intravenous Q24H  . thiamine injection  100 mg Intravenous Q24H   Continuous Infusions: .  prismasol BGK 4/2.5 500 mL/hr at 09/14/19 1008  .  prismasol BGK 4/2.5 500 mL/hr at 09/14/19 1008  . sodium chloride 250 mL (09/12/19 0430)  . esmolol 32 mcg/kg/min (09/14/19 1000)  . heparin 1,600 Units/hr (09/14/19 1000)  . meropenem (MERREM) IV 1 g (09/14/19 0506)  . norepinephrine (LEVOPHED) Adult infusion 24 mcg/min (09/14/19 1000)  . prismasol BGK 4/2.5 2,000 mL/hr at 09/14/19 0902  . vancomycin    . vasopressin (PITRESSIN) infusion - *FOR SHOCK* 0.03 Units/min (09/14/19 1000)   PRN Meds: acetaminophen **OR** acetaminophen, fentaNYL (SUBLIMAZE) injection, haloperidol lactate, heparin, LORazepam, ondansetron **OR** ondansetron (ZOFRAN) IV   Vital Signs    Vitals:   09/14/19 0800 09/14/19 0802 09/14/19 0900 09/14/19 1000  BP: (!) 147/64  (!) 80/62 (!) 155/60  Pulse: (!) 102  (!) 106 97  Resp: (!) 21  (!) 24 (!) 24  Temp: 98.3 F (36.8 C)     TempSrc: Axillary     SpO2: 91% 94% 93% 93%  Weight:      Height:        Intake/Output Summary (Last 24 hours) at 09/14/2019 1035 Last data filed at 09/14/2019 1000 Gross per 24 hour  Intake 1863.24 ml  Output 3963 ml  Net -2099.76 ml   Filed Weights   09/12/19 0539 09/13/19 0316 09/14/19 0318  Weight: 77.7 kg 79.3 kg 77.7 kg    Telemetry    SR with sinus tachycardia - Personally Reviewed  ECG    No new tracings - Personally Reviewed  Physical Exam   GEN: No acute distress. Mittens in place.  Neck: No JVD. Cardiac: Mildly tachycardic, no murmurs, rubs, or gallops.  Respiratory: Clear to  auscultation bilaterally.  GI: Soft, nontender, non-distended.   MS: No edema; No deformity. Neuro:  Somnolent.  Psych: Somnolent.  Labs    Chemistry Recent Labs  Lab 09/20/2019 1841 09/06/2019 1841 09/12/19 0446 09/12/19 1440 09/13/19 0319 09/13/19 1611 09/14/19 0327  NA 139   < > 137   < > 138 138 137  K 3.3*   < > 3.5   < > 4.0 4.3 4.5  CL 102   < > 104   < > 104 104 103  CO2 14*   < > 18*   < > 20* 22 19*  GLUCOSE 173*   < > 142*   < > 118* 119* 129*  BUN 51*   < > 57*   < > 36* 22 17  CREATININE 3.12*   < > 3.32*   < > 1.82* 1.19* 0.91  CALCIUM 8.0*   < > 7.2*   < > 6.7* 7.5* 7.6*  PROT 6.5  --  5.1*  --   --   --  5.7*  ALBUMIN 3.5   < > 2.7*   < > 2.8* 2.5* 2.5*  AST 78*  --  177*  --   --   --  232*  ALT 33  --  58*  --   --   --  168*  ALKPHOS 56  --  44  --   --   --  79  BILITOT 1.1  --  1.4*  --   --   --  1.4*  GFRNONAA 14*   < > 13*   < > 26* 44* >60  GFRAA 16*   < > 15*   < > 30* 51* >60  ANIONGAP 23*   < > 15   < > 14 12 15    < > = values in this interval not displayed.     Hematology Recent Labs  Lab 09/12/19 0446 09/13/19 0319 09/14/19 0327  WBC 9.5 11.5* 16.4*  RBC 4.30 3.77* 3.43*  HGB 14.2 12.3 11.2*  HCT 42.3 35.5* 32.7*  MCV 98.4 94.2 95.3  MCH 33.0 32.6 32.7  MCHC 33.6 34.6 34.3  RDW 12.9 12.7 13.5  PLT 234 234 174    Cardiac EnzymesNo results for input(s): TROPONINI in the last 168 hours. No results for input(s): TROPIPOC in the last 168 hours.   BNP Recent Labs  Lab 09/14/2019 1841  BNP 379.0*     DDimer No results for input(s): DDIMER in the last 168 hours.   Radiology    US Venous Img Lower Bilateral (DVT)  Result Date: 09/12/2019 IMPRESSION: No evidence of deep venous thrombosis in either lower extremity. Electronically Signed   By: Markus Daft M.D.   On: 09/12/2019 13:10   DG Chest Port 1 View  Result Date: 09/12/2019 IMPRESSION: 1. Dual lumen left central line tip: Right atrium. No pneumothorax or complicating feature  observed. 2. Mild bibasilar retrodiaphragmatic/retrocardiac densities potentially from atelectasis, pneumonia, or layering effusion. Electronically Signed   By: Van Clines M.D.   On: 09/12/2019 17:55    Cardiac Studies   2D echo 09/12/2019: 1. Challenging images. Definity used.  2. Left ventricular ejection fraction, by estimation, is 55 to 60%. The  left ventricle has grossly normal function. Left ventricular endocardial  border not optimally defined to evaluate regional wall motion. There is  moderate left ventricular  hypertrophy. Left ventricular diastolic parameters are consistent with  Grade I diastolic dysfunction (impaired relaxation).  3. Right ventricular systolic function was not well visualized. The right  ventricular size is normal.  4. The mitral valve was not well visualized.  5. The aortic valve was not well visualized.  6. Tachycardia noted  __________  [facility] 12/2018:  The patient was monitored for 13 days, 22 hours.  The predominant rhythm was sinus with an average rate of 68 bpm (range 54-105 bpm in sinus).  There were rare PAC's and PVC's.  55 episodes of supraventricular tachycardia (some with probable aberrancy) were observed with a maximal rate of 174 bpm. The longest episode lasted 17 seconds.  There was no sustained arrhythmia or prolonged pause.  Patient triggered events correspond to sinus rhythm and sinus rhythm with isolated PAC's.   Predominantly sinus rhythm with multiple episodes of brief asymptomatic PSVT.  No sustained arrhythmia. __________  [facility] 11/2018: Coronary dominance: codominant  Left mainstem:   Large vessel that bifurcates into the LAD and left circumflex, no significant disease noted  Left anterior descending (LAD):   Large vessel that extends to the apical region, diagonal branch 2 of moderate size, no significant disease noted  Left circumflex (LCx):  Large vessel with OM branch 2, no significant disease  noted  Right coronary artery (RCA):  Right dominant vessel with PL and PDA, no significant disease noted, engaged with AL4 catheter  Left ventriculography: Left ventricular systolic function is normal,  LVEF is estimated at 55-65%, there is no significant mitral regurgitation , no significant aortic valve stenosis  Final Conclusions:   No significant coronary artery disease Chest pain appears to be noncardiac   Recommendations:  Discharge home with follow-up with primary care for noncardiac chest pain  Patient Profile     79 y.o. female with history of normal coronary arteries by LHC in 11/2018, questionable PAF, RAS s/p angioplasty x 2 to the right renal artery in the 1980s, HLD, anemia, frequent falls, eczema, and lumbar pain who was admitted with sepsis with acute pancreatitis/gastroenteritis and ARF requiring CRRT we are seeing for elevated troponin.  Assessment & Plan    1. Elevated troponin: -Recent LHC showed normal coronary arteries -Likely demand ischemia in the setting of the below -Echo this admission with preserved LVSF -No plans for inpatient ischemic workup   2. Sinus tachycardia: -Likely in the setting of her acute illness -No evidence of significant arrhythmia for review -Stop esmolol gtt -TSH normal -Potassium and magnesium at goal  3. Acute on CKD stage III: -Secondary to ATN likely related to intraabdominal process and hypotension with severe sepsis -Nephrology following -Improving with CRRT  4. Septic shock with acute pancreatitis and gastroenteritis with metabolic encephalopathy and acute hypoxic respiratory distress: -Remains on Levophed and vasopressin as well as HFNC -Imaging concerning for possible PNA -She remains on heparin gtt for concern of PE, management deferred to primary service  -Management per PCCM   For questions or updates, please contact [place] Please consult [email or web address] for contact info under Cardiology/STEMI.     Signed, Christell Faith, PA-C [facility] Pager: [phone] 09/14/2019, 10:35 AM

## 2019-09-14 NOTE — Progress Notes (Signed)
ANTICOAGULATION CONSULT NOTE -   Pharmacy Consult for Heparin Indication: A. Flutter and presumed PE   Allergies  Allergen Reactions  . Neomycin Anaphylaxis    blisters  . Lithium   . Meperidine Hcl     REACTION: UNSPECIFIED  . Pravastatin Sodium     Weakness, resolved after stopping medicine 2013  . Systane [Polyethyl Glycol-Propyl Glycol] Rash    Patient Measurements: Height: 5\' 2"  (157.5 cm) Weight: 77.7 kg (171 lb 4.8 oz) IBW/kg (Calculated) : 50.1 HEPARIN DW (KG): 67.1  Vital Signs: Temp: 98.6 F (37 C) (04/19 0400) Temp Source: Axillary (04/19 0400) BP: 136/74 (04/19 0445) Pulse Rate: 102 (04/19 0445)  Labs: Recent Labs    09/05/2019 1841 09/01/2019 2051 09/12/19 0446 09/12/19 0703 09/12/19 1733 09/13/19 0244 09/13/19 0319 09/13/19 0815 09/13/19 1136 09/13/19 1246 09/13/19 1611 09/13/19 1806 09/13/19 2008 09/14/19 0327  HGB 16.3*   < > 14.2   < >  --   --  12.3  --   --   --   --   --   --  11.2*  HCT 48.0*   < > 42.3  --   --   --  35.5*  --   --   --   --   --   --  32.7*  PLT 298   < > 234  --   --   --  234  --   --   --   --   --   --  174  APTT 34  --   --   --   --   --   --   --   --   --   --   --   --   --   LABPROT 15.5*  --  21.5*  --   --   --   --   --   --   --   --   --   --   --   INR 1.2  --  1.9*  --   --   --   --   --   --   --   --   --   --   --   HEPARINUNFRC  --   --   --    < >  --    < >  --   --    < > 0.12*  --   --  0.11* 0.20*  CREATININE 3.12*   < > 3.32*   < > 3.49*  --  1.82*  --   --   --  1.19*  --   --  0.91  TROPONINIHS 648*   < >  --    < > 1,288*  --   --  542*  --   --   --  381*  --   --    < > = values in this interval not displayed.    Estimated Creatinine Clearance: 49.1 mL/min (by C-G formula based on SCr of 0.91 mg/dL).   Medical History: Past Medical History:  Diagnosis Date  . Agitation   . Allergic    Immunotherapy  . Allergic rhinitis   . ALLERGIC RHINITIS 10/26/2006   Qualifier: Diagnosis of   By: Council Mechanic MD, Hilaria Ota   . Anemia, iron deficiency   . Asthma   . Barrett's esophagus 01/04/2011  . Bipolar affective disorder ([place])   . BIPOLAR AFFECTIVE DISORDER, DEPRESSED, HX OF 10/25/2006  Qualifier: Diagnosis of  By: Zara Council LPN, Mariann Laster    . Blood in stool    Via stool cards  . Blood transfusion without reported diagnosis   . Breast cancer ([place]) 1973   Bilateral Mastectomies  . Carpal tunnel syndrome   . Cervical cancer [facility])    Hysterectomy  . Chest pain   . Chronic kidney disease   . 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 Constipation    Mild  . Depressed   . Diarrhea   . DVT (deep venous thrombosis) ([place])   . Fibromyalgia   . Gastric ulcer    Acute  . Gastritis    Barretts, Stricture  . GERD (gastroesophageal reflux disease)   . Gross hematuria 06/23/2015  . History of kidney stones   . Hyperlipidemia   . Hypertension   . HYPERTENSION 10/26/2006   Qualifier: Diagnosis of  By: Council Mechanic MD, Hilaria Ota   . Hypothyroidism   . 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 Ophthalmoplegia    Dr. Nanine Means at [facility]  . Osteoarthritis    Dr. Marveen Reeks Dr. Jefm Bryant  . Osteoarthrosis    Dr. Marveen Reeks  . PAF (paroxysmal atrial fibrillation) ([place])   . Pain 01/2010   Declined by [facility] pain clinic  . Pneumonia    with trach / 2 YOA and 14 YOA  . PONV (postoperative nausea and vomiting)    past hx.-none recent  . Renal artery stenosis ([place]) 01/04/2011   S/p angioplasty of vessels x2   . Rheumatoid arthritis(714.0)    Dr. Jefm Bryant  . Sinusitis, acute 07/09/2012    Medications:  Medications Prior to Admission  Medication Sig Dispense Refill Last Dose  . acetaminophen (TYLENOL) 325 MG tablet Take 650 mg by mouth every 6 (six) hours as needed for mild pain or fever.    Unknown at PRN  . aspirin 81 MG tablet Take 81 mg by mouth daily.   24+ hours at Unknown  . baclofen (LIORESAL) 10 MG tablet Take 5 mg by mouth 2 (two) times daily.    24+ hours at  Unknown  . Calcium Carbonate-Vitamin D (CALTRATE 600+D PO) Take 1 tablet by mouth 2 (two) times a day.      . Cholecalciferol (D 5000) 125 MCG (5000 UT) capsule Take 5,000 Units by mouth daily.      . DULoxetine (CYMBALTA) 60 MG capsule Take 60 mg by mouth every morning.    24+ hours at Unknown  . fenofibrate (TRICOR) 145 MG tablet Take 145 mg by mouth daily.   24+ hours at Unknown  . gabapentin (NEURONTIN) 300 MG capsule Take 300 mg by mouth 3 (three) times daily.    24+ hours at Unknown  . lamoTRIgine (LAMICTAL) 25 MG tablet Take 50 mg by mouth at bedtime.    24+ hours at Unknown  . levothyroxine (SYNTHROID) 25 MCG tablet Take 25 mcg by mouth daily before breakfast.   24+ hours at Unknown  . losartan (COZAAR) 25 MG tablet Take 25 mg by mouth daily.    24+ hours at Unknown  . metoprolol tartrate (LOPRESSOR) 50 MG tablet Take 50 mg by mouth 2 (two) times daily.   24+ hours at Unknown  . Multiple Vitamin (MULTIVITAMIN) tablet Take 1 tablet by mouth daily.       . pantoprazole (PROTONIX) 40 MG tablet Take 40 mg by mouth 2 (two) times daily.    24+ hours at Unknown  . primidone (MYSOLINE) 50 MG tablet Take  50 mg by mouth 2 (two) times a day.    24+ hours at Unknown  . propranolol (INDERAL) 10 MG tablet Take 10 mg by mouth 2 (two) times daily.   24+ hours at Unknown  . vitamin C (ASCORBIC ACID) 250 MG tablet Take 1,000 mg by mouth in the morning and at bedtime.        Assessment: Pharmacy consulted for heparin infusion dosing and monitor for atrial flutter and presumed PE in 79 yo female. Patient is admitted with acute metabolic encephalopathy and sepsis.  Baseline labs ordered. APTT WNL.  No anticoagulants per PTA med list.   4/16 Heparin infusion started @ 1200 units/hr 4/17  @ 1154 HL 0.71. infusion decreased to 1050 units/hr 4/17 @ 1900 heparin infusion restarted  4/18 @ 0244 HL 0.65. Level therapeutic x 1.    Goal of Therapy:  Heparin level 0.3-0.7 units/ml Monitor platelets by  anticoagulation protocol: Yes   Plan:  4/18 @ 1136 HL: 0.21.  Level is subtherapeutic. RN to redraw level to confirm. Repeat level 0.12  Will order 1000 unit bolus and increase current infusion rate to 1150 units/hr   Recheck Check HL ~ 6 hours after rate adjustment Continue to monitor CBC daily per protocol.   4/18:  HL @ 2008 = 0.11 Will order Heparin 2000 units IV X 1 and increase drip rate to 1400 units/hr.  Will recheck HL 8 hrs after rate change.   0419 0327 HL 0.20, subtherapeutic.  Will increase rate to 1600 units/hr and recheck HL in 8 hrs  Ena Dawley, PharmD Clinical Pharmacist 09/14/2019 5:01 AM

## 2019-09-15 ENCOUNTER — Inpatient Hospital Stay: Payer: Medicare Other

## 2019-09-15 ENCOUNTER — Encounter: Payer: Self-pay | Admitting: Internal Medicine

## 2019-09-15 DIAGNOSIS — R000 Tachycardia, unspecified: Secondary | ICD-10-CM | POA: Diagnosis not present

## 2019-09-15 DIAGNOSIS — R778 Other specified abnormalities of plasma proteins: Secondary | ICD-10-CM | POA: Diagnosis not present

## 2019-09-15 DIAGNOSIS — N179 Acute kidney failure, unspecified: Secondary | ICD-10-CM

## 2019-09-15 LAB — HEPARIN LEVEL (UNFRACTIONATED)
Heparin Unfractionated: 0.23 IU/mL — ABNORMAL LOW (ref 0.30–0.70)
Heparin Unfractionated: 0.56 IU/mL (ref 0.30–0.70)
Heparin Unfractionated: 0.89 IU/mL — ABNORMAL HIGH (ref 0.30–0.70)

## 2019-09-15 LAB — RENAL FUNCTION PANEL
Albumin: 2.5 g/dL — ABNORMAL LOW (ref 3.5–5.0)
Albumin: 2.4 g/dL — ABNORMAL LOW (ref 3.5–5.0)
Anion gap: 13 (ref 5–15)
Anion gap: 15 (ref 5–15)
BUN: 14 mg/dL (ref 8–23)
BUN: 17 mg/dL (ref 8–23)
CO2: 23 mmol/L (ref 22–32)
CO2: 18 mmol/L — ABNORMAL LOW (ref 22–32)
Calcium: 7.7 mg/dL — ABNORMAL LOW (ref 8.9–10.3)
Calcium: 7.7 mg/dL — ABNORMAL LOW (ref 8.9–10.3)
Chloride: 104 mmol/L (ref 98–111)
Chloride: 104 mmol/L (ref 98–111)
Creatinine, Ser: 0.75 mg/dL (ref 0.44–1.00)
Creatinine, Ser: 0.86 mg/dL (ref 0.44–1.00)
GFR calc Af Amer: >60 mL/min (ref >60)
GFR calc Af Amer: >60 mL/min (ref >60)
GFR calc non Af Amer: >60 mL/min (ref >60)
GFR calc non Af Amer: >60 mL/min (ref >60)
Glucose, Bld: 133 mg/dL — ABNORMAL HIGH (ref 70–99)
Glucose, Bld: 150 mg/dL — ABNORMAL HIGH (ref 70–99)
Phosphorus: 1.5 mg/dL — ABNORMAL LOW (ref 2.5–4.6)
Phosphorus: 1.7 mg/dL — ABNORMAL LOW (ref 2.5–4.6)
Potassium: 4.3 mmol/L (ref 3.5–5.1)
Potassium: 3.9 mmol/L (ref 3.5–5.1)
Sodium: 140 mmol/L (ref 135–145)
Sodium: 137 mmol/L (ref 135–145)

## 2019-09-15 LAB — CBC
HCT: 30.8 % — ABNORMAL LOW (ref 36.0–46.0)
Hemoglobin: 10.9 g/dL — ABNORMAL LOW (ref 12.0–15.0)
MCH: 32.8 pg (ref 26.0–34.0)
MCHC: 35.4 g/dL (ref 30.0–36.0)
MCV: 92.8 fL (ref 80.0–100.0)
Platelets: 107 10*3/uL — ABNORMAL LOW (ref 150–400)
RBC: 3.32 MIL/uL — ABNORMAL LOW (ref 3.87–5.11)
RDW: 13.7 % (ref 11.5–15.5)
WBC: 16.9 10*3/uL — ABNORMAL HIGH (ref 4.0–10.5)
nRBC: 0.1 % (ref 0.0–0.2)

## 2019-09-15 LAB — LIPASE, BLOOD: Lipase: 15 U/L (ref 11–51)

## 2019-09-15 LAB — MAGNESIUM: Magnesium: 2.6 mg/dL — ABNORMAL HIGH (ref 1.7–2.4)

## 2019-09-15 MED ORDER — SODIUM CHLORIDE 0.9 % IV SOLN
1.0000 g | Freq: Three times a day (TID) | INTRAVENOUS | Status: DC
  Administered 2019-09-15 – 2019-09-16 (×4): 1 g via INTRAVENOUS
  Filled 2019-09-15 (×6): qty 1

## 2019-09-15 MED ORDER — FENTANYL CITRATE (PF) 100 MCG/2ML IJ SOLN
INTRAMUSCULAR | Status: AC
  Administered 2019-09-15: 25 ug via INTRAVENOUS
  Filled 2019-09-15: qty 2

## 2019-09-15 MED ORDER — HEPARIN (PORCINE) 25000 UT/250ML-% IV SOLN
1650.0000 [IU]/h | INTRAVENOUS | Status: DC
  Administered 2019-09-15: 1750 [IU]/h via INTRAVENOUS
  Administered 2019-09-16: 1650 [IU]/h via INTRAVENOUS
  Filled 2019-09-15 (×2): qty 250

## 2019-09-15 MED ORDER — IOHEXOL 350 MG/ML SOLN
100.0000 mL | Freq: Once | INTRAVENOUS | Status: AC | PRN
  Administered 2019-09-15: 100 mL via INTRAVENOUS

## 2019-09-15 MED ORDER — FENTANYL CITRATE (PF) 100 MCG/2ML IJ SOLN: 12.5000 ug | Freq: Once | INTRAMUSCULAR | Status: DC

## 2019-09-15 MED ORDER — FENTANYL CITRATE (PF) 100 MCG/2ML IJ SOLN
25.0000 ug | INTRAMUSCULAR | Status: DC | PRN
  Administered 2019-09-15: 25 ug via INTRAVENOUS
  Administered 2019-09-16 (×2): 50 ug via INTRAVENOUS
  Filled 2019-09-15 (×2): qty 2

## 2019-09-15 MED ORDER — SODIUM PHOSPHATES 45 MMOLE/15ML IV SOLN
20.0000 mmol | Freq: Once | INTRAVENOUS | Status: AC
  Administered 2019-09-15: 20 mmol via INTRAVENOUS
  Filled 2019-09-15: qty 6.67

## 2019-09-15 MED ORDER — FUROSEMIDE 10 MG/ML IJ SOLN
4.0000 mg/h | INTRAVENOUS | Status: DC
  Filled 2019-09-15: qty 25

## 2019-09-15 MED ORDER — FENTANYL CITRATE (PF) 100 MCG/2ML IJ SOLN: 25.0000 ug | Freq: Once | INTRAMUSCULAR | Status: AC

## 2019-09-15 NOTE — Progress Notes (Signed)
[place] for Heparin Indication: A. Flutter and presumed PE   Allergies  Allergen Reactions  . Neomycin Anaphylaxis    blisters  . Lithium   . Meperidine Hcl     REACTION: UNSPECIFIED  . Pravastatin Sodium     Weakness, resolved after stopping medicine 2013  . Systane [Polyethyl Glycol-Propyl Glycol] Rash    Patient Measurements: Height: 5\' 2"  (157.5 cm) Weight: 77.7 kg (171 lb 4.8 oz) IBW/kg (Calculated) : 50.1 HEPARIN DW (KG): 67.1  Vital Signs: Temp: 98.2 F (36.8 C) (04/20 1200) Temp Source: Axillary (04/20 1200) BP: 158/67 (04/20 1300) Pulse Rate: 129 (04/20 1300)  Labs: Recent Labs     0000 09/12/19 1733 09/13/19 0244 09/13/19 0319 09/13/19 0815 09/13/19 1136 09/13/19 1806 09/13/19 2008 09/14/19 0327 09/14/19 1303 09/14/19 1644 09/14/19 2101 09/15/19 0451 09/15/19 0528 09/15/19 1359  HGB   < >  --   --  12.3  --   --   --   --  11.2*  --   --   --  10.9*  --   --   HCT  --   --   --  35.5*  --   --   --   --  32.7*  --   --   --  30.8*  --   --   PLT  --   --   --  234  --   --   --   --  174  --   --   --  107*  --   --   HEPARINUNFRC  --   --    < >  --   --    < >  --    < > 0.20*   < >  --  0.38  --  0.23* 0.56  CREATININE  --  3.49*  --  1.82*  --    < >  --    < > 0.91  --  0.93  --  0.75  --   --   TROPONINIHS  --  1,288*  --   --  542*  --  381*  --   --   --   --   --   --   --   --    < > = values in this interval not displayed.    Estimated Creatinine Clearance: 55.9 mL/min (by C-G formula based on SCr of 0.75 mg/dL).   Medical History: Past Medical History:  Diagnosis Date  . Agitation   . Allergic    Immunotherapy  . Allergic rhinitis   . ALLERGIC RHINITIS 10/26/2006   Qualifier: Diagnosis of  By: Council Mechanic MD, Hilaria Ota   . Anemia, iron deficiency   . Asthma   . Barrett's esophagus 01/04/2011  . Bipolar affective disorder ([place])   . BIPOLAR AFFECTIVE DISORDER, DEPRESSED, HX OF 10/25/2006   Qualifier: Diagnosis of  By: Zara Council LPN, Mariann Laster    . Blood in stool    Via stool cards  . Blood transfusion without reported diagnosis   . Breast cancer ([place]) 1973   Bilateral Mastectomies  . Carpal tunnel syndrome   . Cervical cancer [facility])    Hysterectomy  . Chest pain   . Chronic kidney disease   . 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 Constipation    Mild  . Depressed   . Diarrhea   . DVT (deep venous thrombosis) ([place])   . Fibromyalgia   .  Gastric ulcer    Acute  . Gastritis    Barretts, Stricture  . GERD (gastroesophageal reflux disease)   . Gross hematuria 06/23/2015  . History of kidney stones   . Hyperlipidemia   . Hypertension   . HYPERTENSION 10/26/2006   Qualifier: Diagnosis of  By: Council Mechanic MD, Hilaria Ota   . Hypothyroidism   . 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 Ophthalmoplegia    [facility]. Nanine [facility] at [facility]  . Osteoarthritis    Dr. Marveen Reeks Dr. Jefm Bryant  . Osteoarthrosis    Dr. Marveen Reeks  . PAF (paroxysmal atrial fibrillation) ([place])   . Pain 01/2010   Declined by [facility] pain clinic  . Pneumonia    with trach / 2 YOA and 14 YOA  . PONV (postoperative nausea and vomiting)    past hx.-none recent  . Renal artery stenosis ([place]) 01/04/2011   S/p angioplasty of vessels x2   . Rheumatoid arthritis(714.0)    Dr. Jefm Bryant  . Sinusitis, acute 07/09/2012     Assessment: Pharmacy consulted for heparin infusion dosing and monitor for atrial flutter and presumed PE in 79 yo female. Patient is admitted with acute metabolic encephalopathy and sepsis. No anticoagulants per PTA med list.    Goal of Therapy:  Heparin level 0.3-0.7 units/ml Monitor platelets by anticoagulation protocol: Yes   Plan:  Per discussion on rounds, MD would like to continue heparin drip at this time pending imaging of the abdomen. Plan to also get CT chest to r/o PE at same time.  4/20 1359 HL 0.56 therapeutic x 1. Continue heparin drip at 1750 units/hr. Recheck HL  at 2000 to confirm. CBC daily while on heparin drip.  Tawnya Crook, PharmD Clinical Pharmacist 09/15/2019 2:53 PM

## 2019-09-15 NOTE — Progress Notes (Signed)
CRRT restarted at [id number] without complications.

## 2019-09-15 NOTE — Consult Note (Signed)
PHARMACY CONSULT NOTE - FOLLOW UP  Pharmacy Consult for Electrolyte Monitoring and Replacement   Recent Labs: Potassium (mmol/L)  Date Value  09/15/2019 4.3  08/29/2012 3.9   Magnesium (mg/dL)  Date Value  09/15/2019 2.6 (H)   Calcium (mg/dL)  Date Value  09/15/2019 7.7 (L)   Calcium, Total (mg/dL)  Date Value  08/29/2012 8.9   Albumin (g/dL)  Date Value  09/15/2019 2.5 (L)  08/29/2012 4.0   Phosphorus (mg/dL)  Date Value  09/15/2019 1.5 (L)   Sodium (mmol/L)  Date Value  09/15/2019 140  08/29/2012 142   Corrected Calcium: 8.8   Assessment: Pharmacy consulted for electrolyte monitoring and replacement for 79 yo female admitted with AKI. Patient initiated on CRRT 4/17.   Goal of Therapy:  Electrolytes WNL   Plan:  Sodium phos 20 mmol IV x 1. Discussed with Nephrology in setting of CRRT.    Tawnya Crook, PharmD Clinical Pharmacist 09/15/2019 11:51 AM

## 2019-09-15 NOTE — Progress Notes (Signed)
[place] for Heparin Indication: A. Flutter and presumed PE   Allergies  Allergen Reactions  . Neomycin Anaphylaxis    blisters  . Lithium   . Meperidine Hcl     REACTION: UNSPECIFIED  . Pravastatin Sodium     Weakness, resolved after stopping medicine 2013  . Systane [Polyethyl Glycol-Propyl Glycol] Rash    Patient Measurements: Height: 5\' 2"  (157.5 cm) Weight: 77.7 kg (171 lb 4.8 oz) IBW/kg (Calculated) : 50.1 HEPARIN DW (KG): 67.1  Vital Signs: Temp: 98.4 F (36.9 C) (04/20 0100) Temp Source: Axillary (04/20 0100) BP: 142/52 (04/20 0400) Pulse Rate: 98 (04/20 0400)  Labs: Recent Labs     0000 09/12/19 1733 09/13/19 0244 09/13/19 0319 09/13/19 0815 09/13/19 1136 09/13/19 1806 09/13/19 2008 09/14/19 0327 09/14/19 0327 09/14/19 1303 09/14/19 1644 09/14/19 2101 09/15/19 0451 09/15/19 0528  HGB   < >  --   --  12.3  --   --   --   --  11.2*  --   --   --   --  10.9*  --   HCT  --   --   --  35.5*  --   --   --   --  32.7*  --   --   --   --  30.8*  --   PLT  --   --   --  234  --   --   --   --  174  --   --   --   --  107*  --   HEPARINUNFRC  --   --    < >  --   --    < >  --    < > 0.20*   < > 0.45  --  0.38  --  0.23*  CREATININE  --  3.49*  --  1.82*  --    < >  --    < > 0.91  --   --  0.93  --  0.75  --   TROPONINIHS  --  1,288*  --   --  542*  --  381*  --   --   --   --   --   --   --   --    < > = values in this interval not displayed.    Estimated Creatinine Clearance: 55.9 mL/min (by C-G formula based on SCr of 0.75 mg/dL).   Medical History: Past Medical History:  Diagnosis Date  . Agitation   . Allergic    Immunotherapy  . Allergic rhinitis   . ALLERGIC RHINITIS 10/26/2006   Qualifier: Diagnosis of  By: Council Mechanic MD, Hilaria Ota   . Anemia, iron deficiency   . Asthma   . Barrett's esophagus 01/04/2011  . Bipolar affective disorder ([place])   . BIPOLAR AFFECTIVE DISORDER, DEPRESSED, HX OF 10/25/2006    Qualifier: Diagnosis of  By: Zara Council LPN, Mariann Laster    . Blood in stool    Via stool cards  . Blood transfusion without reported diagnosis   . Breast cancer ([place]) 1973   Bilateral Mastectomies  . Carpal tunnel syndrome   . Cervical cancer [facility])    Hysterectomy  . Chest pain   . Chronic kidney disease   . 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 Constipation    Mild  . Depressed   . Diarrhea   . DVT (deep venous thrombosis) ([place])   . Fibromyalgia   .  Gastric ulcer    Acute  . Gastritis    Barretts, Stricture  . GERD (gastroesophageal reflux disease)   . Gross hematuria 06/23/2015  . History of kidney stones   . Hyperlipidemia   . Hypertension   . HYPERTENSION 10/26/2006   Qualifier: Diagnosis of  By: Council Mechanic MD, Hilaria Ota   . Hypothyroidism   . 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 Ophthalmoplegia    Dr. Nanine Means at [facility]  . Osteoarthritis    Dr. Marveen Reeks Dr. Jefm Bryant  . Osteoarthrosis    Dr. Marveen Reeks  . PAF (paroxysmal atrial fibrillation) ([place])   . Pain 01/2010   Declined by [facility] pain clinic  . Pneumonia    with trach / 2 YOA and 14 YOA  . PONV (postoperative nausea and vomiting)    past hx.-none recent  . Renal artery stenosis ([place]) 01/04/2011   S/p angioplasty of vessels x2   . Rheumatoid arthritis(714.0)    Dr. Jefm Bryant  . Sinusitis, acute 07/09/2012     Assessment: Pharmacy consulted for heparin infusion dosing and monitor for atrial flutter and presumed PE in 79 yo female. Patient is admitted with acute metabolic encephalopathy and sepsis.  Baseline labs ordered. APTT WNL.  No anticoagulants per PTA med list.    Goal of Therapy:  Heparin level 0.3-0.7 units/ml Monitor platelets by anticoagulation protocol: Yes   Plan:  4/20 0500 HL 0.23 subtherapeutic. Will increase rate to 1750 units/hr and will recheck HL at 1400, CBC has been trending down, will continue to monitor.  Tobie Lords, PharmD Clinical  Pharmacist 09/15/2019 6:28 AM

## 2019-09-15 NOTE — Progress Notes (Signed)
Progress Note  Patient Name: Olivia [name] Date of Encounter: 09/15/2019  Primary Cardiologist: End  Subjective   Somnolent. No acute overnight events.   Inpatient Medications    Scheduled Meds: . Chlorhexidine Gluconate Cloth  6 each Topical Daily  . hydrocortisone sod succinate (SOLU-CORTEF) inj  100 mg Intravenous Q8H  . pantoprazole (PROTONIX) IV  40 mg Intravenous Q24H  . thiamine injection  100 mg Intravenous Q24H   Continuous Infusions: .  prismasol BGK 4/2.5 500 mL/hr at 09/15/19 0400  .  prismasol BGK 4/2.5 500 mL/hr at 09/15/19 0400  . sodium chloride 250 mL (09/12/19 0430)  . heparin    . meropenem (MERREM) IV    . norepinephrine (LEVOPHED) Adult infusion Stopped (09/14/19 2140)  . prismasol BGK 4/2.5 2,000 mL/hr at 09/14/19 1805  . vancomycin 1,000 mg (09/14/19 2216)  . vasopressin (PITRESSIN) infusion - *FOR SHOCK* Stopped (09/14/19 1544)   PRN Meds: acetaminophen **OR** acetaminophen, fentaNYL (SUBLIMAZE) injection, haloperidol lactate, heparin, LORazepam, ondansetron **OR** ondansetron (ZOFRAN) IV   Vital Signs    Vitals:   09/15/19 0122 09/15/19 0200 09/15/19 0300 09/15/19 0400  BP:  (!) 139/52 (!) 140/53 (!) 142/52  Pulse:  96 95 98  Resp:  16 17 17   Temp:      TempSrc:      SpO2: 96% 98% 97% 97%  Weight:      Height:        Intake/Output Summary (Last 24 hours) at 09/15/2019 0704 Last data filed at 09/15/2019 0700 Gross per 24 hour  Intake 896.33 ml  Output 1354 ml  Net -457.67 ml   Filed Weights   09/12/19 0539 09/13/19 0316 09/14/19 0318  Weight: 77.7 kg 79.3 kg 77.7 kg    Telemetry    SR with sinus tachycardia - Personally Reviewed  ECG    No new tracings - Personally Reviewed  Physical Exam   GEN: No acute distress. Mittens in place.  Neck: No JVD. Cardiac: Mildly tachycardic, no murmurs, rubs, or gallops.  Respiratory: Clear to auscultation bilaterally.  GI: Soft, mildly firm and tender, non-distended.   MS: No  edema; No deformity. Neuro:  Somnolent.  Psych: Somnolent.  Labs    Chemistry Recent Labs  Lab 09/01/2019 1841 09/10/2019 1841 09/12/19 0446 09/12/19 1440 09/14/19 0327 09/14/19 1644 09/15/19 0451  NA 139   < > 137   < > 137 138 140  K 3.3*   < > 3.5   < > 4.5 4.1 4.3  CL 102   < > 104   < > 103 104 104  CO2 14*   < > 18*   < > 19* 17* 23  GLUCOSE 173*   < > 142*   < > 129* 125* 133*  BUN 51*   < > 57*   < > 17 15 14   CREATININE 3.12*   < > 3.32*   < > 0.91 0.93 0.75  CALCIUM 8.0*   < > 7.2*   < > 7.6* 7.6* 7.7*  PROT 6.5  --  5.1*  --  5.7*  --   --   ALBUMIN 3.5   < > 2.7*   < > 2.5* 2.8* 2.5*  AST 78*  --  177*  --  232*  --   --   ALT 33  --  58*  --  168*  --   --   ALKPHOS 56  --  44  --  79  --   --  BILITOT 1.1  --  1.4*  --  1.4*  --   --   GFRNONAA 14*   < > 13*   < > >60 59* >60  GFRAA 16*   < > 15*   < > >60 >60 >60  ANIONGAP 23*   < > 15   < > 15 17* 13   < > = values in this interval not displayed.     Hematology Recent Labs  Lab 09/13/19 0319 09/14/19 0327 09/15/19 0451  WBC 11.5* 16.4* 16.9*  RBC 3.77* 3.43* 3.32*  HGB 12.3 11.2* 10.9*  HCT 35.5* 32.7* 30.8*  MCV 94.2 95.3 92.8  MCH 32.6 32.7 32.8  MCHC 34.6 34.3 35.4  RDW 12.7 13.5 13.7  PLT 234 174 107*    Cardiac EnzymesNo results for input(s): TROPONINI in the last 168 hours. No results for input(s): TROPIPOC in the last 168 hours.   BNP Recent Labs  Lab 09/19/2019 1841  BNP 379.0*     DDimer No results for input(s): DDIMER in the last 168 hours.   Radiology    US Venous Img Lower Bilateral (DVT)  Result Date: 09/12/2019 IMPRESSION: No evidence of deep venous thrombosis in either lower extremity. Electronically Signed   By: Markus Daft M.D.   On: 09/12/2019 13:10   DG Chest Port 1 View  Result Date: 09/12/2019 IMPRESSION: 1. Dual lumen left central line tip: Right atrium. No pneumothorax or complicating feature observed. 2. Mild bibasilar retrodiaphragmatic/retrocardiac densities  potentially from atelectasis, pneumonia, or layering effusion. Electronically Signed   By: Van Clines M.D.   On: 09/12/2019 17:55    Cardiac Studies   2D echo 09/12/2019: 1. Challenging images. Definity used.  2. Left ventricular ejection fraction, by estimation, is 55 to 60%. The  left ventricle has grossly normal function. Left ventricular endocardial  border not optimally defined to evaluate regional wall motion. There is  moderate left ventricular  hypertrophy. Left ventricular diastolic parameters are consistent with  Grade I diastolic dysfunction (impaired relaxation).  3. Right ventricular systolic function was not well visualized. The right  ventricular size is normal.  4. The mitral valve was not well visualized.  5. The aortic valve was not well visualized.  6. Tachycardia noted  __________  [facility] 12/2018:  The patient was monitored for 13 days, 22 hours.  The predominant rhythm was sinus with an average rate of 68 bpm (range 54-105 bpm in sinus).  There were rare PAC's and PVC's.  55 episodes of supraventricular tachycardia (some with probable aberrancy) were observed with a maximal rate of 174 bpm. The longest episode lasted 17 seconds.  There was no sustained arrhythmia or prolonged pause.  Patient triggered events correspond to sinus rhythm and sinus rhythm with isolated PAC's.   Predominantly sinus rhythm with multiple episodes of brief asymptomatic PSVT.  No sustained arrhythmia. __________  [facility] 11/2018: Coronary dominance: codominant  Left mainstem:   Large vessel that bifurcates into the LAD and left circumflex, no significant disease noted  Left anterior descending (LAD):   Large vessel that extends to the apical region, diagonal branch 2 of moderate size, no significant disease noted  Left circumflex (LCx):  Large vessel with OM branch 2, no significant disease noted  Right coronary artery (RCA):  Right dominant vessel with PL and PDA, no  significant disease noted, engaged with AL4 catheter  Left ventriculography: Left ventricular systolic function is normal, LVEF is estimated at 55-65%, there is no significant mitral regurgitation ,  no significant aortic valve stenosis  Final Conclusions:   No significant coronary artery disease Chest pain appears to be noncardiac   Recommendations:  Discharge home with follow-up with primary care for noncardiac chest pain  Patient Profile     79 y.o. female with history of normal coronary arteries by LHC in 11/2018, questionable PAF, RAS s/p angioplasty x 2 to the right renal artery in the 1980s, HLD, anemia, frequent falls, eczema, and lumbar pain who was admitted with sepsis with acute pancreatitis/gastroenteritis and ARF requiring CRRT we are seeing for elevated troponin.  Assessment & Plan    1. Elevated troponin: -Recent LHC showed normal coronary arteries -Likely demand ischemia in the setting of the below -Echo this admission with preserved LVSF -No plans for inpatient ischemic workup   2. Sinus tachycardia: -Likely in the setting of her acute illness -No evidence of significant arrhythmia for review -No further work up -TSH normal -Potassium and magnesium at goal  3. Acute on CKD stage III: -Secondary to ATN likely related to intraabdominal process and hypotension with severe sepsis -Nephrology following -Improving with CRRT  4. Septic shock with acute pancreatitis and gastroenteritis with metabolic encephalopathy and acute hypoxic respiratory distress: -Remains on Levophed and vasopressin as well as HFNC -Imaging concerning for possible PNA -She remains on heparin gtt for concern of PE, management deferred to primary service  -Management per PCCM   For questions or updates, please contact [place] Please consult [email or web address] for contact info under Cardiology/STEMI.    Signed, Christell Faith, PA-C [place] Pager: [phone] 09/15/2019, 7:04 AM

## 2019-09-15 NOTE — Progress Notes (Addendum)
  Speech Language Pathology Treatment: Dysphagia  Patient Details Name: Olivia [name] MRN: [id number] DOB: 06/05/1940 Today's Date: 09/15/2019 Time: [id number] SLP Time Calculation (min) (ACUTE ONLY): 40 min  Assessment / Plan / Recommendation Clinical Impression  Pt seen today for ongoing assessment of oral awareness and swallow function; potential for initiation of pleasure ice chips and/or diet. Pt remains on HFNC w/ 50% O2 support. Pt remains poorly alert, mostly drowsy w/ min head nodding to basic y/n questions -- she was able to appropriately answer "no" w/ head movement to 2/2 negative questions. She is on continuous CRRT at this time d/t medical status and elevated lactic acid. NSG reported Not giving any sedating medications in the past ~24 hours that would impact her alertness. Pt continues to present w/ oropharyngeal phase dysphagia significantly impacted by her declined awareness and orientation to the task of oral intake/trials of ice chips. Pt's eyes remained closed w/ only brief attending intermittently w/ cues given. She allowed oral care and oral swabs orally but gave little oral response to the stimulation of the swabs. When given stimulation of po ice chips at lips and tongue, she opened mouth just slightly but not enough to allow much intervention. She made gave no motor movements/response to the ice chips laying anteriorly in mouth or melted along the lips. This occurred w/ all trials. Pharyngeal swallowing was appreciated x4 b/t trials; Max verbal/tactile stimulation given to encourage more swallows. No other trial consistencies were attempted d/t risk for aspiration, choking. Recommend continue NPO status including ice chips at this time d/t decreased alertness and follow through w/ task/awareness of oral intake as well as increased risk for aspiration and further decline in Pulmonary status. Recommend frequent oral care for hygiene and stimulation of swallowing. Aspiration  precautions. ST services will f/u w/ po trials asssessment again pending pt's improved alertness and awake State. MD/NSG and family present consulted and agreed.    HPI HPI: Pt is a 79 y.o. female with medical history significant for Esophageal Dysmotility including Barrett's Esophagus, Gastritis, GERD, Achalasia, hypertension, hypothyroidism, bipolar affective disorder, CAD with unremarkable left heart cath July 2020, paroxysmal A. fib not on anticoagulation who was brought brought in by EMS for unresponsiveness being bagged on arrival.  EMS reported O2 sat of 77% on room air.  Patient reports a 1 week history of nausea vomiting and diarrhea.  Pt admitted w/ AKI, dehydration, gastroenteritis with mesenteric edema and possible pancreatitis. Noted elevated Lactic Acid; CRRT ongoing.  Pt is on HFNC O2 support.       SLP Plan  Continue with current plan of care       Recommendations  Diet recommendations: NPO Medication Administration: Via alternative means                General recommendations: (Dietician f/u) Oral Care Recommendations: Oral care QID;Staff/trained caregiver to provide oral care Follow up Recommendations: Skilled Nursing facility SLP Visit Diagnosis: Dysphagia, oropharyngeal phase (R13.12) Plan: Continue with current plan of care       [place], MS, CCC-SLP Remus Hagedorn 09/15/2019, 1:46 PM

## 2019-09-15 NOTE — Progress Notes (Signed)
[facility]  ROUNDING NOTE   Subjective:  Patient continues on CRRT at this time. Urine output was 380 cc over the patient 24 hours. Phosphorus noted to be low at 1.5.   Objective:  Vital signs in last 24 hours:  Temp:  [97.8 F (36.6 C)-98.6 F (37 C)] 97.8 F (36.6 C) (04/20 0800) Pulse Rate:  [91-125] 104 (04/20 1000) Resp:  [15-29] 20 (04/20 1000) BP: (130-182)/(47-124) 167/56 (04/20 1000) SpO2:  [85 %-98 %] 98 % (04/20 1000) FiO2 (%):  [60 %-65 %] 60 % (04/20 0739)  Weight change:  Filed Weights   09/12/19 0539 09/13/19 0316 09/14/19 0318  Weight: 77.7 kg 79.3 kg 77.7 kg    Intake/Output: I/O last 3 completed shifts: In: 1962.7 [I.V.:1426.3; Other:42; IV Piggyback:494.5] Out: 0175 [Urine:485; Other:3237]   Intake/Output this shift:  Total I/O In: 151.9 [I.V.:51.9; IV Piggyback:100] Out: 151 [Urine:25; Other:126]  Physical Exam: General: Critically ill-appearing  Head: Normocephalic, atraumatic. Moist oral mucosal membranes  Eyes: Anicteric  Neck: Supple, trachea midline  Lungs:  Scattered rhonchi, normal effort  Heart: S1S2, tachycardic  Abdomen:  Mild diffuse tenderness, distention noted  Extremities: Trace peripheral edema.  Neurologic: Lethargic at the moment  Skin: No lesions  Access: Left IJ temporary dialysis catheter    Basic Metabolic Panel: Recent Labs  Lab 09/19/2019 1841 09/12/19 0446 09/12/19 1005 09/12/19 1440 09/13/19 0319 09/13/19 0319 09/13/19 1611 09/13/19 1611 09/14/19 0327 09/14/19 1644 09/15/19 0451  NA 139   < >  --    < > 138  --  138  --  137 138 140  K 3.3*   < >  --    < > 4.0  --  4.3  --  4.5 4.1 4.3  CL 102   < >  --    < > 104  --  104  --  103 104 104  CO2 14*   < >  --    < > 20*  --  22  --  19* 17* 23  GLUCOSE 173*   < >  --    < > 118*  --  119*  --  129* 125* 133*  BUN 51*   < >  --    < > 36*  --  22  --  _0 CREATININE 3.12*   < >  --    < > 1.82*  --  1.19*  --  0.91 0.93 0.75  CALCIUM  8.0*   < >  --    < > 6.7*   < > 7.5*   < > 7.6* 7.6* 7.7*  MG 2.5*  --  1.7  --  1.9  --   --   --  2.6*  --  2.6*  PHOS  --   --  3.3   < > 3.4  --  2.8  --  2.5 1.4* 1.5*   < > = values in this interval not displayed.    Liver Function Tests: Recent Labs  Lab 08/27/2019 1841 08/27/2019 1841 09/12/19 0446 09/12/19 1733 09/13/19 0319 09/13/19 1611 09/14/19 0327 09/14/19 1644 09/15/19 0451  AST 78*  --  177*  --   --   --  232*  --   --   ALT 33  --  58*  --   --   --  168*  --   --   ALKPHOS 56  --  44  --   --   --  79  --   --   BILITOT 1.1  --  1.4*  --   --   --  1.4*  --   --   PROT 6.5  --  5.1*  --   --   --  5.7*  --   --   ALBUMIN 3.5   < > 2.7*   < > 2.8* 2.5* 2.5* 2.8* 2.5*   < > = values in this interval not displayed.   Recent Labs  Lab 09/09/2019 2051 09/12/19 0446 09/12/19 0703 09/13/19 0319  LIPASE 517*  --  200* 49  AMYLASE  --  320*  --  98   No results for input(s): AMMONIA in the last 168 hours.  CBC: Recent Labs  Lab 08/29/2019 1841 09/12/19 0446 09/13/19 0319 09/14/19 0327 09/15/19 0451  WBC 15.1* 9.5 11.5* 16.4* 16.9*  NEUTROABS 12.7*  --   --   --   --   HGB 16.3* 14.2 12.3 11.2* 10.9*  HCT 48.0* 42.3 35.5* 32.7* 30.8*  MCV 96.6 98.4 94.2 95.3 92.8  PLT 298 234 234 174 107*    Cardiac Enzymes: No results for input(s): CKTOTAL, CKMB, CKMBINDEX, TROPONINI in the last 168 hours.  BNP: Invalid input(s): POCBNP  CBG: Recent Labs  Lab 09/12/19 0102 09/12/19 1944  GLUCAP 129* 114*    Microbiology: Results for orders placed or performed during the hospital encounter of 09/20/2019  Blood culture (routine x 2)     Status: Abnormal   Collection Time: 09/14/2019  6:41 PM   Specimen: BLOOD  Result Value Ref Range Status   Specimen Description   Final    BLOOD RIGHT ARM Performed at [facility], [address], [place], [place] [address]    Special Requests   Final    BOTTLES DRAWN AEROBIC AND ANAEROBIC Blood Culture results may  not be optimal due to an inadequate volume of blood received in culture bottles Performed at [facility], [address] [place], [place] [address]    Culture  Setup Time   Final    AEROBIC BOTTLE ONLY GRAM POSITIVE COCCI CRITICAL RESULT CALLED TO, READ BACK BY AND VERIFIED WITH: SCOTT HALL ON 09/12/19 AT 2210 [facility]    Culture (A)  Final    STAPHYLOCOCCUS SPECIES (COAGULASE NEGATIVE) THE SIGNIFICANCE OF ISOLATING THIS ORGANISM FROM A SINGLE SET OF BLOOD CULTURES WHEN MULTIPLE SETS ARE DRAWN IS UNCERTAIN. PLEASE NOTIFY THE MICROBIOLOGY DEPARTMENT WITHIN ONE WEEK IF SPECIATION AND SENSITIVITIES ARE REQUIRED. Performed at [facility], [place] [address] [place], [place] [address]    Report Status 09/14/2019 FINAL  Final  Blood Culture ID Panel (Reflexed)     Status: Abnormal   Collection Time: 09/22/2019  6:41 PM  Result Value Ref Range Status   Enterococcus species NOT DETECTED NOT DETECTED Final   Listeria monocytogenes NOT DETECTED NOT DETECTED Final   Staphylococcus species DETECTED (A) NOT DETECTED Final    Comment: Methicillin (oxacillin) susceptible coagulase negative staphylococcus. Possible blood culture contaminant (unless isolated from more than one blood culture draw or clinical case suggests pathogenicity). No antibiotic treatment is indicated for blood  culture contaminants. CRITICAL RESULT CALLED TO, READ BACK BY AND VERIFIED WITH: SCOTT HALL ON 09/12/19 AT 2210 [place]    Staphylococcus aureus (BCID) NOT DETECTED NOT DETECTED Final   Methicillin resistance NOT DETECTED NOT DETECTED Final   Streptococcus species NOT DETECTED NOT DETECTED Final   Streptococcus agalactiae NOT DETECTED NOT DETECTED Final  Streptococcus pneumoniae NOT DETECTED NOT DETECTED Final   Streptococcus pyogenes NOT DETECTED NOT DETECTED Final   Acinetobacter baumannii NOT DETECTED NOT DETECTED Final   Enterobacteriaceae species NOT DETECTED NOT DETECTED Final   Enterobacter cloacae complex NOT  DETECTED NOT DETECTED Final   Escherichia coli NOT DETECTED NOT DETECTED Final   Klebsiella oxytoca NOT DETECTED NOT DETECTED Final   Klebsiella pneumoniae NOT DETECTED NOT DETECTED Final   Proteus species NOT DETECTED NOT DETECTED Final   Serratia marcescens NOT DETECTED NOT DETECTED Final   Haemophilus influenzae NOT DETECTED NOT DETECTED Final   Neisseria meningitidis NOT DETECTED NOT DETECTED Final   Pseudomonas aeruginosa NOT DETECTED NOT DETECTED Final   Candida albicans NOT DETECTED NOT DETECTED Final   Candida glabrata NOT DETECTED NOT DETECTED Final   Candida krusei NOT DETECTED NOT DETECTED Final   Candida parapsilosis NOT DETECTED NOT DETECTED Final   Candida tropicalis NOT DETECTED NOT DETECTED Final    Comment: Performed at [facility], [address] [place], [place] [address]  Respiratory Panel by RT PCR (Flu A&B, Covid) - Nasopharyngeal Swab     Status: None   Collection Time: 08/27/2019  7:14 PM   Specimen: Nasopharyngeal Swab  Result Value Ref Range Status   SARS Coronavirus 2 by RT PCR NEGATIVE NEGATIVE Final    Comment: (NOTE) SARS-CoV-2 target nucleic acids are NOT DETECTED. The SARS-CoV-2 RNA is generally detectable in upper respiratoy specimens during the acute phase of infection. The lowest concentration of SARS-CoV-2 viral copies this assay can detect is 131 copies/mL. A negative result does not preclude SARS-Cov-2 infection and should not be used as the sole basis for treatment or other patient management decisions. A negative result may occur with  improper specimen collection/handling, submission of specimen other than nasopharyngeal swab, presence of viral mutation(s) within the areas targeted by this assay, and inadequate number of viral copies (<131 copies/mL). A negative result must be combined with clinical observations, patient history, and epidemiological information. The expected result is Negative. Fact Sheet for Patients:   PinkCheek.be Fact Sheet for Healthcare Providers:  GravelBags.it This test is not yet ap proved or cleared by the [place] FDA and  has been authorized for detection and/or diagnosis of SARS-CoV-2 by FDA under an Emergency Use Authorization (EUA). This EUA will remain  in effect (meaning this test can be used) for the duration of the COVID-19 declaration under Section 564(b)(1) of the Act, 21 U.S.C. section 360bbb-3(b)(1), unless the authorization is terminated or revoked sooner.    Influenza A by PCR NEGATIVE NEGATIVE Final   Influenza B by PCR NEGATIVE NEGATIVE Final    Comment: (NOTE) The Xpert Xpress SARS-CoV-2/FLU/RSV assay is intended as an aid in  the diagnosis of influenza from Nasopharyngeal swab specimens and  should not be used as a sole basis for treatment. Nasal washings and  aspirates are unacceptable for Xpert Xpress SARS-CoV-2/FLU/RSV  testing. Fact Sheet for Patients: PinkCheek.be Fact Sheet for Healthcare Providers: GravelBags.it This test is not yet approved or cleared by the [place] FDA and  has been authorized for detection and/or diagnosis of SARS-CoV-2 by  FDA under an Emergency Use Authorization (EUA). This EUA will remain  in effect (meaning this test can be used) for the duration of the  Covid-19 declaration under Section 564(b)(1) of the Act, 21  U.S.C. section 360bbb-3(b)(1), unless the authorization is  terminated or revoked. Performed at [facility], [address], [place], [place] [address]   Blood  culture (routine x 2)     Status: None (Preliminary result)   Collection Time: 09/06/2019  7:35 PM   Specimen: BLOOD  Result Value Ref Range Status   Specimen Description BLOOD RIGHT ANTECUBITAL  Final   Special Requests   Final    BOTTLES DRAWN AEROBIC AND ANAEROBIC Blood Culture results may not be optimal due to an  inadequate volume of blood received in culture bottles   Culture   Final    NO GROWTH 4 DAYS Performed at [facility], [address] [place], [place] [address]    Report Status PENDING  Incomplete  Urine culture     Status: None   Collection Time: 09/08/2019 11:51 PM   Specimen: Urine, Random  Result Value Ref Range Status   Specimen Description   Final    URINE, RANDOM Performed at [facility], [address], [place], [place] [address]    Special Requests   Final    NONE Performed at [facility], [address] [place], [place] [address]    Culture   Final    NO GROWTH Performed at [facility], [place] [address] [place], [place] [address]    Report Status 09/13/2019 FINAL  Final  MRSA PCR Screening     Status: None   Collection Time: 09/12/19  1:07 AM   Specimen: Nasopharyngeal  Result Value Ref Range Status   MRSA by PCR NEGATIVE NEGATIVE Final    Comment:        The GeneXpert MRSA Assay (FDA approved for NASAL specimens only), is one component of a comprehensive MRSA colonization surveillance program. It is not intended to diagnose MRSA infection nor to guide or monitor treatment for MRSA infections. Performed at [facility], [address] [place], [place] [address]   CULTURE, BLOOD (ROUTINE X 2) w Reflex to ID Panel     Status: None (Preliminary result)   Collection Time: 09/13/19  8:37 PM   Specimen: BLOOD LEFT HAND  Result Value Ref Range Status   Specimen Description BLOOD LEFT HAND  Final   Special Requests   Final    BOTTLES DRAWN AEROBIC AND ANAEROBIC Blood Culture adequate volume   Culture   Final    NO GROWTH 2 DAYS Performed at [facility], [address], [place], [place] [address]    Report Status PENDING  Incomplete  CULTURE, BLOOD (ROUTINE X 2) w Reflex to ID Panel     Status: None (Preliminary result)   Collection Time: 09/13/19  8:52 PM   Specimen: BLOOD RIGHT HAND  Result Value  Ref Range Status   Specimen Description BLOOD RIGHT HAND  Final   Special Requests   Final    BOTTLES DRAWN AEROBIC AND ANAEROBIC Blood Culture results may not be optimal due to an inadequate volume of blood received in culture bottles   Culture   Final    NO GROWTH 2 DAYS Performed at [facility], [address] [place], [place] [address]    Report Status PENDING  Incomplete    Coagulation Studies: No results for input(s): LABPROT, INR in the last 72 hours.  Urinalysis: No results for input(s): COLORURINE, LABSPEC, PHURINE, GLUCOSEU, HGBUR, BILIRUBINUR, KETONESUR, PROTEINUR, UROBILINOGEN, NITRITE, LEUKOCYTESUR in the last 72 hours.  Invalid input(s): APPERANCEUR    Imaging: No results found.   Medications:   .  prismasol BGK 4/2.5 500 mL/hr at 09/15/19 0400  .  prismasol BGK 4/2.5 500 mL/hr at  09/15/19 0400  . sodium chloride 250 mL (09/12/19 0430)  . heparin 1,750 Units/hr (09/15/19 1000)  . meropenem (MERREM) IV Stopped (09/15/19 0742)  . norepinephrine (LEVOPHED) Adult infusion Stopped (09/14/19 2140)  . prismasol BGK 4/2.5 2,000 mL/hr at 09/15/19 0948  . sodium phosphate  Dextrose 5% IVPB 20 mmol (09/15/19 1044)   . Chlorhexidine Gluconate Cloth  6 each Topical Daily  . hydrocortisone sod succinate (SOLU-CORTEF) inj  100 mg Intravenous Q8H  . pantoprazole (PROTONIX) IV  40 mg Intravenous Q24H  . thiamine injection  100 mg Intravenous Q24H   heparin, LORazepam, ondansetron **OR** ondansetron (ZOFRAN) IV  Assessment/ Plan:  79 y.o. female past history of degenerative disc disease, breast cancer, cervical cancer status post hysterectomy, depression, GERD, hyperlipidemia, hypertension, hypothyroidism, osteophytes, urinary incontinence admitted with nausea, vomiting, diarrhea and found to have generalized enteritis along with pancreatitis.  1.  Acute kidney injury secondary to acute tubular necrosis/chronic kidney disease stage IIIa baseline EGFR 59.  Patient  appears to have severe acute kidney injury now likely related to intraabdominal process and hypotension.  -Urine output slightly more than before at 380 cc of the patient 24 hours.  Continue CRRT.  Phosphorus noted to be low.  Discussed with pharmacy and this will be repleted.  2.  Hypotension.  Patient weaned off of pressors.  Continue to monitor blood pressure trend.  3.  Metabolic acidosis.  Much improved.  Serum bicarbonate now 23.  CRRT assisting with this.   LOS: 4 Arilla Hice 4/20/202111:02 AM

## 2019-09-15 NOTE — Progress Notes (Signed)
[facility] for Heparin Indication: A. Flutter and presumed PE   Allergies  Allergen Reactions  . Neomycin Anaphylaxis    blisters  . Lithium   . Meperidine Hcl     REACTION: UNSPECIFIED  . Pravastatin Sodium     Weakness, resolved after stopping medicine 2013  . Systane [Polyethyl Glycol-Propyl Glycol] Rash    Patient Measurements: Height: 5\' 2"  (157.5 cm) Weight: 77.7 kg (171 lb 4.8 oz) IBW/kg (Calculated) : 50.1 HEPARIN DW (KG): 67.1  Vital Signs: Temp: 98.8 F (37.1 C) (04/20 1600) Temp Source: Axillary (04/20 1600) BP: 154/82 (04/20 2000) Pulse Rate: 130 (04/20 2000)  Labs: Recent Labs     0000 09/13/19 0319 09/13/19 0815 09/13/19 1136 09/13/19 1806 09/13/19 2008 09/14/19 0327 09/14/19 1303 09/14/19 1644 09/14/19 2101 09/15/19 0451 09/15/19 0528 09/15/19 1359 09/15/19 1730 09/15/19 1901  HGB   < > 12.3  --   --   --   --  11.2*  --   --   --  10.9*  --   --   --   --   HCT  --  35.5*  --   --   --   --  32.7*  --   --   --  30.8*  --   --   --   --   PLT  --  234  --   --   --   --  174  --   --   --  107*  --   --   --   --   HEPARINUNFRC  --   --   --    < >  --    < > 0.20*   < >  --    < >  --  0.23* 0.56  --  0.89*  CREATININE  --  1.82*  --    < >  --    < > 0.91  --  0.93  --  0.75  --   --  0.86  --   TROPONINIHS  --   --  542*  --  381*  --   --   --   --   --   --   --   --   --   --    < > = values in this interval not displayed.    Estimated Creatinine Clearance: 52 mL/min (by C-G formula based on SCr of 0.86 mg/dL).   Medical History: Past Medical History:  Diagnosis Date  . Agitation   . Allergic    Immunotherapy  . Allergic rhinitis   . ALLERGIC RHINITIS 10/26/2006   Qualifier: Diagnosis of  By: Council Mechanic MD, Hilaria Ota   . Anemia, iron deficiency   . Asthma   . Barrett's esophagus 01/04/2011  . Bipolar affective disorder ([place])   . BIPOLAR AFFECTIVE DISORDER, DEPRESSED, HX OF 10/25/2006   Qualifier: Diagnosis of  By: Zara Council LPN, Mariann Laster    . Blood in stool    Via stool cards  . Blood transfusion without reported diagnosis   . Breast cancer ([place]) 1973   Bilateral Mastectomies  . Carpal tunnel syndrome   . Cervical cancer [facility])    Hysterectomy  . Chest pain   . Chronic kidney disease   . 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 Collagen vascular disease ([employer])   . Constipation    Mild  . Depressed   . Diarrhea   . DVT (deep  venous thrombosis) ([place])   . Fibromyalgia   . Gastric ulcer    Acute  . Gastritis    [place]ts, Stricture  . GERD (gastroesophageal reflux disease)   . Gross hematuria 06/23/2015  . History of kidney stones   . Hyperlipidemia   . Hypertension   . HYPERTENSION 10/26/2006   Qualifier: Diagnosis of  By: Council Mechanic MD, Hilaria Ota   . Hypothyroidism   . 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 Ophthalmoplegia    Dr. Nanine Means at [facility]  . Osteoarthritis    Dr. Marveen Reeks Dr. Jefm Bryant  . Osteoarthrosis    Dr. Marveen Reeks  . PAF (paroxysmal atrial fibrillation) ([place])   . Pain 01/2010   Declined by [facility] pain clinic  . Pneumonia    with trach / 2 YOA and 14 YOA  . PONV (postoperative nausea and vomiting)    past hx.-none recent  . Renal artery stenosis ([place]) 01/04/2011   S/p angioplasty of vessels x2   . Rheumatoid arthritis(714.0)    Dr. Jefm Bryant  . Sinusitis, acute 07/09/2012     Assessment: Pharmacy consulted for heparin infusion dosing and monitor for atrial flutter and presumed PE in 79 yo female. Patient is admitted with acute metabolic encephalopathy and sepsis. No anticoagulants per PTA med list.   4/20 1359 HL 0.56  4/20 1901 HL 0.89   Goal of Therapy:  Heparin level 0.3-0.7 units/ml Monitor platelets by anticoagulation protocol: Yes   Plan:  Per discussion on rounds, MD would like to continue heparin drip at this time pending imaging of the abdomen. Plan to also get CT chest to r/o PE at same time.  Heparin  level is supratherapeutic. Will decrease heparin drip to 1650 units/hr. Recheck HL at 0500. CBC daily while on heparin drip.  Oswald Hillock, PharmD, BCPS Clinical Pharmacist 09/15/2019 8:23 PM

## 2019-09-15 NOTE — Consult Note (Signed)
Pharmacy Antibiotic Note  Olivia [name] is a 79 y.o. female admitted on 08/29/2019 with an intra-abdominal infection.   Pharmacy has been consulted for meropenem dosing.  Scr: 3.32>> 1.82 WBC: 9.5>> 11.5 PCT: 63.48>> 117.68 Lactate: 2.8 >> 3.9   Patient was initiated on CRRT 4/17  Plan: 04/20 @ 0600 CrCl > 50 ml/min. Will readjust meropenem back to 1g IV every  8 hours (CRRT dosing)   Pharmacy will continue to monitor renal function and CRRT plan and adjust dose as needed.    Height: 5\' 2"  (157.5 cm) Weight: 77.7 kg (171 lb 4.8 oz) IBW/kg (Calculated) : 50.1  Temp (24hrs), Avg:98.4 F (36.9 C), Min:98.2 F (36.8 C), Max:98.6 F (37 C)  Recent Labs  Lab 08/27/2019 1841 08/27/2019 2051 09/12/19 0446 09/12/19 0446 09/12/19 0703 09/12/19 1154 09/12/19 1440 09/12/19 1733 09/12/19 1733 09/13/19 0319 09/13/19 1611 09/14/19 0327 09/14/19 1644 09/15/19 0451  WBC 15.1*  --  9.5  --   --   --   --   --   --  11.5*  --  16.4*  --  16.9*  CREATININE 3.12*   < > 3.32*  --   --   --    < > 3.49*   < > 1.82* 1.19* 0.91 0.93 0.75  LATICACIDVEN 9.8*   < > 3.8*   < > 2.8* 3.0*  --  3.2*  --  3.9*  --  1.7  --   --    < > = values in this interval not displayed.    Estimated Creatinine Clearance: 55.9 mL/min (by C-G formula based on SCr of 0.75 mg/dL).    Allergies  Allergen Reactions  . Neomycin Anaphylaxis    blisters  . Lithium   . Meperidine Hcl     REACTION: UNSPECIFIED  . Pravastatin Sodium     Weakness, resolved after stopping medicine 2013  . Systane [Polyethyl Glycol-Propyl Glycol] Rash    Antimicrobials this admission: 4/16 Cefepime/flagyl/Zosyn/Vanco >> x 1  4/17 meropenem >>   Microbiology results: 4/16 BCx: 1 of 4 GPC 4/16 MRSA PCR: negative   Thank you for allowing pharmacy to be a part of this patient's care.  Tobie Lords, PharmD, BCPS Clinical Pharmacist 09/15/2019 6:40 AM

## 2019-09-15 NOTE — Progress Notes (Signed)
Name: Olivia [name] MRN: [id number] DOB: Oct 17, 1940    ADMISSION DATE:  09/16/2019 CONSULTATION DATE:  09/12/2019  REFERRING MD :  Rufina Falco, NP  CHIEF COMPLAINT:  Unresponsiveness  BRIEF PATIENT DESCRIPTION:  79 y.o. Female, DNI, admitted 09/12/19 with Acute Hypoxic Respiratory Failure secondary to ? PE, severe metabolic acidosis, SVT vs. Atrial flutter w/ RVR, elevated troponin (NSTEMI vs. Demand ischemia), Cardiogenic shock (due to rapid rate) & septic shock (in setting of acute pancreatitis & Gastroenteritis), and AKI.  She has been placed on Bicarb gtt, Heparin & Amiodarone gtts.  Cardiology & Nephrology are consulted.  EVENTS  4/17: Admission to [facility] 4/17: DNR in event of cardiopulm arrest but otherwise treat 4/17: Critically ill ~ SVT vs. A.flutter w/ RVR, Resp Failure, shock, AKI.  PCCM, Cardiology, & Nephrology consulted  4/17: Adenosine 6 mg IV x1 dose without noted improvement in HR; Cardizem d/c and placed on Amiodarone gtt 4/17: Persistent RVR despite Amio, trial of beta blocker with good response, esmolol gtt, amio d/c'd 4/19 - Family meeting with both daughters - patient had apperntly been taking multiple prescription and OTC meds as well as random natural remedies that she had ordered from television, family believes this is the cause of her current clinical demise.     STUDIES:  4/16: CXR>> Cardiac shadows within normal limits. Lungs are well aerated bilaterally. No focal infiltrate or sizable effusion is seen. No bony abnormality is noted. 4/16: CT Head w/o contrast>>No acute intracranial process. 4/16: CT Cervical Spine>> Extensive multilevel cervical degenerative changes. No acute fracture. 4/16: CT Abdomen & Pelvis>>1. Fluid within the distal esophagus, throughout prominent smallbowel, cecum and ascending colon with associated mesenteric edema. Overall findings consistent with generalized enteritis. Wall thickening of the duodenum proximal jejunum. 2.  Mild peripancreatic fat stranding and edema most prominent about the head and uncinate process, can be seen with acute pancreatitis in the appropriate clinical setting. Recommend correlation with pancreatic enzymes. 3. Small volume abdominopelvic ascites. Generalized mesenteric and intra-abdominal edema. Trace pleural effusions/pleural thickening. 4. 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 5. Stable 2.1 cm left ovarian cyst dating back to 2017, likely benign. 6. Additional stable chronic findings as described.  4/17: Venous US Bilateral LE>> No DVT  CULTURES: SARS-CoV-2 PCR 4/16>> negative Influenza PCR 4/16>> negative Blood culture x2 4/16>> MSSA x1 only ? contam. Urine 4/16>>negative MRSA PCR 4/17>> negative GI panel PCR 4/16>> Blood cultures x2 4/18>>  ANTIBIOTICS: Cefepime 4/16>> 4/17 Flagyl 4/16>> 4/17 Vancomycin x1 dose in ED 4/16 Zosyn 4/17>> 4/17 Meropenem >> 4/17 Vanco >> 4/17   .  prismasol BGK 4/2.5 500 mL/hr at 09/15/19 0400  .  prismasol BGK 4/2.5 500 mL/hr at 09/15/19 0400  . sodium chloride 250 mL (09/12/19 0430)  . heparin 1,750 Units/hr (09/15/19 0900)  . meropenem (MERREM) IV Stopped (09/15/19 0742)  . norepinephrine (LEVOPHED) Adult infusion Stopped (09/14/19 2140)  . prismasol BGK 4/2.5 2,000 mL/hr at 09/15/19 0719  . sodium phosphate  Dextrose 5% IVPB    . vancomycin Stopped (09/15/19 [id number])  . vasopressin (PITRESSIN) infusion - *FOR SHOCK* Stopped (09/14/19 1544)      Allergies  Allergen Reactions  . Neomycin Anaphylaxis    blisters  . Lithium   . Meperidine Hcl     REACTION: UNSPECIFIED  . Pravastatin Sodium     Weakness, resolved after stopping medicine 2013  . Systane [Polyethyl Glycol-Propyl Glycol] Rash   .  REVIEW OF SYSTEMS: Unable to obtain due to patient's encephalopathy  SUBJECTIVE:  Patient is very confused, pulling at lines test to have mittens to prevent pulling of lines particularly dialysis catheter. Occasionally more  lucid.  States abdominal pain better.  No other complaints reported.  Fidgety in bed.   VITAL SIGNS: Temp:  [97.8 F (36.6 C)-98.6 F (37 C)] 97.8 F (36.6 C) (04/20 0800) Pulse Rate:  [91-125] 107 (04/20 0900) Resp:  [15-29] 26 (04/20 0900) BP: (130-182)/(47-124) 165/73 (04/20 0900) SpO2:  [85 %-98 %] 97 % (04/20 0900) FiO2 (%):  [60 %-65 %] 60 % (04/20 0739)  PHYSICAL EXAMINATION: General:  Critically ill appearing female, laying in bed, on HFNC, with mild respiratory distress Neuro:  Awake, confused, no focal deficits, speech garbled HEENT:  Atraumatic, normocephalic, neck supple, no JVD, left IJ Trialysis catheter Cardiovascular:  Tachycardia, regular rhythm, 1+ pulses, cool extremities though less dusky, no edema Lungs:  Clear to auscultation bilaterally, no wheezing or rales, tachycpnea, no overt accessory muscle use Abdomen:  Soft, non-tender to palpitation, mildly distended, no guarding or rebound tenderness Musculoskeletal:  Generalized weakness, no deformities, no edema Skin:  Cool and dry.  No obvious rashes, lesions, or ulcerations  Recent Labs  Lab 09/14/19 0327 09/14/19 1644 09/15/19 0451  NA 137 138 140  K 4.5 4.1 4.3  CL 103 104 104  CO2 19* 17* 23  BUN 17 15 14   CREATININE 0.91 0.93 0.75  GLUCOSE 129* 125* 133*   Recent Labs  Lab 09/13/19 0319 09/14/19 0327 09/15/19 0451  HGB 12.3 11.2* 10.9*  HCT 35.5* 32.7* 30.8*  WBC 11.5* 16.4* 16.9*  PLT 234 174 107*   No results found.  ASSESSMENT / PLAN:  Acute Hypoxic Respiratory Failure     -possible aspiration due to small RML infiltrate in context of polypharmacy and overmedication -Supplemental O2 as needed to maintain O2 sats greater than 92% -High flow O2 as needed -Patient is DNR but agreed to CRRT -Follow intermittent chest x-ray and ABG as needed -CXR on 4/17 with retrocardiac densities potentially atelectasis versus pneumonia -PRN bronchodilators -IV meropenem -Heparin gtt -Lower  extremity Dopplers negative for DVT  Circulatory shock           - present on admission          - at this point seems to be distributive due to GI losses/ probable mesenteric ischemia which appears to be chronic due to sitophobia per family. Patient had taken multiple OTC remedies and per family likely had overdose on unknown substance.    Cardiogenic shock (secondary to rapid HR) + Septic Shock (secondary to Acute Pancreatitis & Gastroenteritis) -Chronic HFpEF, currently without acute exacerbation -Continuous cardiac monitoring -Maintain MAP greater than 65 -Cautious IV fluids given HFpEF and AKI -Norepinephrine plus vasopressin as needed to maintain map goal -On esmolol infusion to control heart rate, so far tolerating -Heparin drip for anticoagulation -Trend troponin until downtrending -Trend Lactic acid -Cardiology consulted, appreciate input -2D echocardiogram: LVEF 55 to [id number], grade 1 diastolic dysfunction, LVH   AKI Anion Gap Metabolic Acidosis -Monitor I&O's / urinary output -Follow BMP -Ensure adequate renal perfusion -Avoid nephrotoxic agents as able -Replace electrolytes as indicated -IV Fluids -appreciate input from nephrology -On CRRT   BEST PRACTICES: DISPOSITION: ICU GOALS OF CARE: DNR in the event of cardiopulmonary arrest, aggressive therapy otherwise VTE PROPHYLAXIS/ANTICOAGULATION: Heparin gtt UPDATES: Updated pt at bedside 4/17 and updated pt's daughter Marcie Bal [facility]) via telephone 09/12/19 CONSULTS: Hospitalist (primary service), Cardiology, Nephrology GI prophylaxis: Protonix    Critical care provider statement:  Critical care time (minutes):  34   Critical care time was exclusive of:  Separately billable procedures and  treating other patients   Critical care was necessary to treat or prevent imminent or  life-threatening deterioration of the following conditions:  acute hypoxemic respiratory failure, AKI, multiple comorbid conditions   Critical  care was time spent personally by me on the following  activities:  Development of treatment plan with patient or surrogate,  discussions with consultants, evaluation of patient's response to  treatment, examination of patient, obtaining history from patient or  surrogate, ordering and performing treatments and interventions, ordering  and review of laboratory studies and re-evaluation of patient's condition   I assumed direction of critical care for this patient from another  provider in my specialty: no    09/15/2019, 10:03 AM   *This note was dictated using voice recognition software/Dragon.  Despite best efforts to proofread, errors can occur which can change the meaning.  Any change was purely unintentional.    Ottie Glazier, M.D.  Pulmonary & [place]

## 2019-09-15 NOTE — Progress Notes (Signed)
CRRT stopped at this time in order to take pt to CT. Will resume once scans are completed.

## 2019-09-16 LAB — CBC
HCT: 33.5 % — ABNORMAL LOW (ref 36.0–46.0)
Hemoglobin: 11.6 g/dL — ABNORMAL LOW (ref 12.0–15.0)
MCH: 32.5 pg (ref 26.0–34.0)
MCHC: 34.6 g/dL (ref 30.0–36.0)
MCV: 93.8 fL (ref 80.0–100.0)
Platelets: 117 10*3/uL — ABNORMAL LOW (ref 150–400)
RBC: 3.57 MIL/uL — ABNORMAL LOW (ref 3.87–5.11)
RDW: 13.6 % (ref 11.5–15.5)
WBC: 16.5 10*3/uL — ABNORMAL HIGH (ref 4.0–10.5)
nRBC: 0.4 % — ABNORMAL HIGH (ref 0.0–0.2)

## 2019-09-16 LAB — RENAL FUNCTION PANEL
Albumin: 2.3 g/dL — ABNORMAL LOW (ref 3.5–5.0)
Albumin: 2.3 g/dL — ABNORMAL LOW (ref 3.5–5.0)
Anion gap: 14 (ref 5–15)
Anion gap: 11 (ref 5–15)
BUN: 18 mg/dL (ref 8–23)
BUN: 20 mg/dL (ref 8–23)
CO2: 19 mmol/L — ABNORMAL LOW (ref 22–32)
CO2: 21 mmol/L — ABNORMAL LOW (ref 22–32)
Calcium: 7.5 mg/dL — ABNORMAL LOW (ref 8.9–10.3)
Calcium: 7.3 mg/dL — ABNORMAL LOW (ref 8.9–10.3)
Chloride: 103 mmol/L (ref 98–111)
Chloride: 107 mmol/L (ref 98–111)
Creatinine, Ser: 0.76 mg/dL (ref 0.44–1.00)
Creatinine, Ser: 0.86 mg/dL (ref 0.44–1.00)
GFR calc Af Amer: >60 mL/min (ref >60)
GFR calc Af Amer: >60 mL/min (ref >60)
GFR calc non Af Amer: >60 mL/min (ref >60)
GFR calc non Af Amer: >60 mL/min (ref >60)
Glucose, Bld: 148 mg/dL — ABNORMAL HIGH (ref 70–99)
Glucose, Bld: 157 mg/dL — ABNORMAL HIGH (ref 70–99)
Phosphorus: 1.2 mg/dL — ABNORMAL LOW (ref 2.5–4.6)
Phosphorus: 2.4 mg/dL — ABNORMAL LOW (ref 2.5–4.6)
Potassium: 4.1 mmol/L (ref 3.5–5.1)
Potassium: 4 mmol/L (ref 3.5–5.1)
Sodium: 136 mmol/L (ref 135–145)
Sodium: 139 mmol/L (ref 135–145)

## 2019-09-16 LAB — MAGNESIUM: Magnesium: 2.4 mg/dL (ref 1.7–2.4)

## 2019-09-16 LAB — CULTURE, BLOOD (ROUTINE X 2): Culture: NO GROWTH

## 2019-09-16 LAB — HEPARIN LEVEL (UNFRACTIONATED): Heparin Unfractionated: 3.24 IU/mL — ABNORMAL HIGH (ref 0.30–0.70)

## 2019-09-16 LAB — PROCALCITONIN: Procalcitonin: 22.69 ng/mL

## 2019-09-16 MED ORDER — HEPARIN (PORCINE) 25000 UT/250ML-% IV SOLN
1200.0000 [IU]/h | INTRAVENOUS | Status: DC
  Administered 2019-09-16: 1200 [IU]/h via INTRAVENOUS

## 2019-09-16 MED ORDER — METOPROLOL TARTRATE 5 MG/5ML IV SOLN
INTRAVENOUS | Status: AC
  Administered 2019-09-16: 2.5 mg via INTRAVENOUS
  Filled 2019-09-16: qty 5

## 2019-09-16 MED ORDER — SODIUM PHOSPHATES 45 MMOLE/15ML IV SOLN
30.0000 mmol | Freq: Once | INTRAVENOUS | Status: AC
  Administered 2019-09-16: 30 mmol via INTRAVENOUS
  Filled 2019-09-16: qty 10

## 2019-09-16 MED ORDER — HEPARIN SODIUM (PORCINE) 5000 UNIT/ML IJ SOLN
5000.0000 [IU] | Freq: Three times a day (TID) | INTRAMUSCULAR | Status: DC
  Administered 2019-09-16 – 2019-09-18 (×6): 5000 [IU] via SUBCUTANEOUS
  Filled 2019-09-16 (×6): qty 1

## 2019-09-16 MED ORDER — SODIUM CHLORIDE 0.9 % IV SOLN
2.0000 g | Freq: Two times a day (BID) | INTRAVENOUS | Status: DC
  Administered 2019-09-16 – 2019-09-17 (×4): 2 g via INTRAVENOUS
  Filled 2019-09-16 (×7): qty 2

## 2019-09-16 MED ORDER — METOPROLOL TARTRATE 5 MG/5ML IV SOLN
2.5000 mg | Freq: Four times a day (QID) | INTRAVENOUS | Status: DC | PRN
  Administered 2019-09-16: 5 mg via INTRAVENOUS
  Filled 2019-09-16: qty 5

## 2019-09-16 MED ORDER — ORAL CARE MOUTH RINSE
15.0000 mL | Freq: Two times a day (BID) | OROMUCOSAL | Status: DC
  Administered 2019-09-16 – 2019-09-17 (×4): 15 mL via OROMUCOSAL

## 2019-09-16 NOTE — Progress Notes (Signed)
SLP Cancellation Note  Patient Details Name: Olivia [name] MRN: [id number] DOB: 02/07/1941   Cancelled treatment:       Reason Eval/Treat Not Completed: Medical issues which prohibited therapy;Patient not medically ready(chart reviewed; attempted to see pt, talked w/ Dtr). Pt was less responsive today than at other sessions. Noted increased RR at rest(mid30s) and low BP. She remains on continuous CRRT. Per NSG, pt required suctioning of saliva pooling posteriorly in oropharynx today. Talked w/ Daughter about providing tactile and verbal stim through soothing hands/arms/feet w/ warm washcloth then rubbing w/ a light lotion. Frequent oral care via swabs to provide oral stimulation, hygiene. ST services will f/u next 1-2 days w/ pt's status for appropriateness for dysphagia tx sessions. Noted Palliative Care consult placed.     Orinda Kenner, MS, CCC-SLP Watson,Katherine 09/16/2019, 4:25 PM

## 2019-09-16 NOTE — Progress Notes (Signed)
Progress Note  Patient Name: Olivia [name] Date of Encounter: 09/16/2019  Primary Cardiologist: Nelva Bush, MD   Subjective   No acute events overnight.  Patient somnolent this morning.  Family at bedside.  Inpatient Medications    Scheduled Meds: . Chlorhexidine Gluconate Cloth  6 each Topical Daily  . hydrocortisone sod succinate (SOLU-CORTEF) inj  100 mg Intravenous Q8H  . pantoprazole (PROTONIX) IV  40 mg Intravenous Q24H  . thiamine injection  100 mg Intravenous Q24H   Continuous Infusions: .  prismasol BGK 4/2.5 500 mL/hr at 09/15/19 0400  .  prismasol BGK 4/2.5 500 mL/hr at 09/15/19 0400  . sodium chloride 250 mL (09/12/19 0430)  . heparin 1,200 Units/hr (09/16/19 0706)  . meropenem (MERREM) IV Stopped (09/16/19 0540)  . norepinephrine (LEVOPHED) Adult infusion Stopped (09/14/19 2140)  . prismasol BGK 4/2.5 2,000 mL/hr at 09/15/19 2238  . sodium phosphate  Dextrose 5% IVPB     PRN Meds: fentaNYL (SUBLIMAZE) injection, heparin, LORazepam, metoprolol tartrate, ondansetron **OR** ondansetron (ZOFRAN) IV   Vital Signs    Vitals:   09/16/19 0600 09/16/19 0700 09/16/19 0755 09/16/19 0800  BP: (!) 152/56 (!) 141/57  (!) 153/67  Pulse: (!) 111 (!) 107 (!) 110 (!) 109  Resp: (!) 43 (!) 37 (!) 38 (!) 28  Temp:  99 F (37.2 C)    TempSrc:      SpO2: 94% 96% 94% 95%  Weight:      Height:        Intake/Output Summary (Last 24 hours) at 09/16/2019 0902 Last data filed at 09/16/2019 0800 Gross per 24 hour  Intake 884.78 ml  Output 1709 ml  Net -824.22 ml   Last 3 Weights 09/16/2019 09/14/2019 09/13/2019  Weight (lbs) 171 lb 1.2 oz 171 lb 4.8 oz 174 lb 13.2 oz  Weight (kg) 77.6 kg 77.7 kg 79.3 kg      Telemetry    Sinus tachycardia- Personally Reviewed  ECG    No new EKG obtained- Personally Reviewed  Physical Exam   GEN:  Somnolent, unable to have any conversation Neck: No JVD Cardiac:  Tachycardic, regular, no murmurs, rubs, or gallops.   Respiratory: Clear to auscultation bilaterally. GI: Soft, nontender, non-distended  MS: No edema; No deformity. Neuro:   Unable to assess Psych: Unable to assess  Labs    High Sensitivity Troponin:   Recent Labs  Lab 09/12/19 0703 09/12/19 1005 09/12/19 1733 09/13/19 0815 09/13/19 1806  TROPONINIHS 1,262* 1,255* 1,288* 542* 381*      Chemistry Recent Labs  Lab 09/24/2019 1841 09/20/2019 1841 09/12/19 0446 09/12/19 1440 09/14/19 0327 09/14/19 1644 09/15/19 0451 09/15/19 1730 09/16/19 0356  NA 139   < > 137   < > 137   < > 140 137 136  K 3.3*   < > 3.5   < > 4.5   < > 4.3 3.9 4.1  CL 102   < > 104   < > 103   < > 104 104 103  CO2 14*   < > 18*   < > 19*   < > 23 18* 19*  GLUCOSE 173*   < > 142*   < > 129*   < > 133* 150* 148*  BUN 51*   < > 57*   < > 17   < > 14 17 18   CREATININE 3.12*   < > 3.32*   < > 0.91   < > 0.75 0.86 0.76  CALCIUM  8.0*   < > 7.2*   < > 7.6*   < > 7.7* 7.7* 7.5*  PROT 6.5  --  5.1*  --  5.7*  --   --   --   --   ALBUMIN 3.5   < > 2.7*   < > 2.5*   < > 2.5* 2.4* 2.3*  AST 78*  --  177*  --  232*  --   --   --   --   ALT 33  --  58*  --  168*  --   --   --   --   ALKPHOS 56  --  44  --  79  --   --   --   --   BILITOT 1.1  --  1.4*  --  1.4*  --   --   --   --   GFRNONAA 14*   < > 13*   < > >60   < > >60 >60 >60  GFRAA 16*   < > 15*   < > >60   < > >60 >60 >60  ANIONGAP 23*   < > 15   < > 15   < > 13 15 14    < > = values in this interval not displayed.     Hematology Recent Labs  Lab 09/14/19 0327 09/15/19 0451 09/16/19 0356  WBC 16.4* 16.9* 16.5*  RBC 3.43* 3.32* 3.57*  HGB 11.2* 10.9* 11.6*  HCT 32.7* 30.8* 33.5*  MCV 95.3 92.8 93.8  MCH 32.7 32.8 32.5  MCHC 34.3 35.4 34.6  RDW 13.5 13.7 13.6  PLT 174 107* 117*    BNP Recent Labs  Lab 08/27/2019 1841  BNP 379.0*     DDimer No results for input(s): DDIMER in the last 168 hours.   Radiology    CT ANGIO CHEST PE W OR WO CONTRAST  Result Date: 09/15/2019 CLINICAL DATA:   79 year old female with concern for pulmonary embolism. EXAM: CT ANGIOGRAPHY CHEST WITH CONTRAST TECHNIQUE: Multidetector CT imaging of the chest was performed using the standard protocol during bolus administration of intravenous contrast. Multiplanar CT image reconstructions and MIPs were obtained to evaluate the vascular anatomy. CONTRAST:  117mL OMNIPAQUE IOHEXOL 350 MG/ML SOLN COMPARISON:  Chest CT dated 12/03/2018. FINDINGS: Evaluation is limited due to streak artifact caused by patient's arms. Cardiovascular: There is mild to moderate cardiomegaly. No pericardial effusion. The thoracic aorta is unremarkable. The origins of the great vessels of the aortic arch appear patent as visualized. Evaluation of the pulmonary arteries is very limited due to respiratory motion artifact and suboptimal opacification and visualization of the peripheral branches. No obvious large central pulmonary artery embolus identified. Mediastinum/Nodes: No hilar or mediastinal adenopathy noted. Evaluation however is very limited due to consolidative changes of the adjacent lungs. The esophagus is grossly unremarkable. There is a subcentimeter hypodense focus in the left thyroid lobe which may be artifactual or represent a nodule. No mediastinal fluid collection. Left IJ central venous line with tip close to the cavoatrial junction. Lungs/Pleura: There are moderate bilateral pleural effusions with complete consolidative changes and collapse of the lower lobes bilaterally with air bronchogram. This finding is new since the prior CT. There is partial consolidation of the lingula with air bronchograms as well as near complete consolidative changes of the right middle lobe. There is no pneumothorax. The central airways remain patent. Upper Abdomen: See the report for the abdomen pelvis. Musculoskeletal: Degenerative changes of the spine. No  acute osseous pathology. Review of the MIP images confirms the above findings. IMPRESSION: 1. No  definite CT evidence of central pulmonary artery embolus. Evaluation of the peripheral branches is very limited due to respiratory motion artifact and suboptimal opacification of the peripheral branches. 2. Moderate bilateral pleural effusions with complete consolidative changes and collapse of the lower lobes bilaterally and near complete consolidative changes of the right middle lobe. Findings may represent a combination of atelectasis and pneumonia. Clinical correlation and follow-up to resolution recommended. 3. Mild to moderate cardiomegaly. Electronically Signed   By: Anner Crete M.D.   On: 09/15/2019 16:48   CT Angio Abd/Pel w/ and/or w/o  Result Date: 09/15/2019 CLINICAL DATA:  79 year old with acute hypoxic respiratory failure. Evaluate for mesenteric ischemia. EXAM: CT ANGIOGRAPHY ABDOMEN AND PELVIS WITH CONTRAST AND WITHOUT CONTRAST TECHNIQUE: Multidetector CT imaging of the abdomen and pelvis was performed using the standard protocol during bolus administration of intravenous contrast. Multiplanar reconstructed images and MIPs were obtained and reviewed to evaluate the vascular anatomy. CONTRAST:  141mL OMNIPAQUE IOHEXOL 350 MG/ML SOLN COMPARISON:  CT abdomen and pelvis 09/21/2019 and CT abdomen/pelvis 06/30/2018 FINDINGS: VASCULAR Aorta: Small amount of atherosclerotic plaque in the infrarenal abdominal aorta without aneurysm. Celiac: There is not a true celiac trunk. Splenic artery originates directly from the aorta. Common hepatic artery originates directly from the abdominal aorta. Again noted are metallic densities near the gastric cardia and region of the left gastric artery. SMA: SMA is widely patent without significant plaque, stenosis or aneurysm. Renals: Bilateral renal arteries are patent without significant stenosis. 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 IMA:  Proximal IMA is patent. Limited evaluation of the IMA beyond the origin. Inflow: Patent without evidence of aneurysm, dissection, vasculitis or significant stenosis. Proximal Outflow: Proximal femoral arteries are patent bilaterally. Stranding and evidence for a small hematoma in the left groin anterior to the left femoral vessels. No clear evidence for pseudoaneurysm. Veins: Hepatic veins and portal veins are patent. Main mesenteric veins are patent. Splenic vein is patent. IVC and renal veins are patent. Iliac and proximal femoral veins appear to be patent. Central venous catheter in the right common femoral vein that terminates in the distal right common iliac vein. Review of the MIP images confirms the above findings. NON-VASCULAR Lower chest: Bilateral pleural effusions with extensive volume loss and consolidation in the lower lobes. Heart is enlarged for size. Hepatobiliary: Perihepatic ascites. Perihepatic ascites has slightly decreased from the recent comparison examination. Cholecystectomy. Low-density in the left hepatic lobe probably represents focal fat near the falciform ligament. Pancreas: Large amount of fluid and edema surrounding the pancreas. Cannot exclude small amount of necrosis in the pancreatic neck region. Spleen: Spleen is normal for size. Slightly decreased attenuation along the inferior aspect of spleen may be artifactual. Adrenals/Urinary Tract: Adrenal glands are within normal limits. There is a small fat attenuating lesion along the posterior right kidney that measures 0.9 cm. This structure has not significantly changed since 2020 and suggestive for a small angiomyolipoma. Catheter within the urinary bladder. Small amount of gas in the urinary bladder is likely iatrogenic. Stomach/Bowel: Normal appearance of stomach and duodenum without distension. There is diffuse distension of small bowel containing fluid. Degree of small bowel distension has slightly increased since 09/24/2019.  Cannot exclude mild wall thickening involving the rectum, sigmoid colon and descending colon. Some of the small bowel loops in  the anterior lower abdomen are poorly defined but there is no clear evidence for extraluminal gas or abscess formation. Lymphatic: No significant lymph node enlargement. Reproductive: Status post hysterectomy. Low-density left ovarian or adnexal structure measures 2.3 x 1.2 cm and this measured 2.6 x 1.6 cm in 2020. This is most compatible with a benign finding. Other: Again noted is free fluid in the pelvis. Slightly increased amount of pelvic fluid without gas. Edema and a small amount of fluid in the right paracolic gutter. Fluid between the right kidney and the liver. Small amount of ascites in the left paracolic gutter and inferior aspect of the spleen. Again noted are surgical clips scattered throughout the anterior abdominal walls. Increased subcutaneous edema from the recent comparison examination. Musculoskeletal: No acute bone abnormality. IMPRESSION: VASCULAR 1. Main visceral arteries are patent without significant stenosis. 2. Atherosclerotic disease in the abdominal aorta without aneurysm or dissection. Aortic Atherosclerosis (ICD10-I70.0). 3. Main venous structures are patent. 4. 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 5. Focal edema and probably a small hematoma just anterior to left femoral vessels. Question recent catheterization in this area. No clear evidence for a pseudoaneurysm. NON-VASCULAR 1. Extensive edema and fluid centered around the pancreas. Findings are concerning for pancreatitis and there may be a small amount of pancreatic necrosis in the region of the pancreatic neck. No evidence for pseudocyst formation. 2. Dilated loops of small bowel throughout the abdomen and pelvis containing fluid. The small bowel distension has progressed since 08/29/2019. Findings are nonspecific but could be related to an adynamic ileus. There is enhancement  throughout the small bowel walls without significant small bowel wall thickening. 3. Possible wall thickening involving the descending colon and sigmoid colon. These areas are difficult to evaluate due to incomplete distension. Mild colitis cannot be excluded. 4. Persistent abdominopelvic ascites. 5. Extensive volume loss and consolidation at both lung bases with bilateral pleural effusions. 6. Stable fat attenuating lesion in the right kidney that measures up to 0.9 cm. This is suggestive for a small angiomyolipoma. Electronically Signed   By: Markus Daft M.D.   On: 09/15/2019 17:22    Cardiac Studies   2D echo 09/12/2019: 1. Challenging images. Definity used.  2. Left ventricular ejection fraction, by estimation, is 55 to 60%. The  left ventricle has grossly normal function. Left ventricular endocardial  border not optimally defined to evaluate regional wall motion. There is  moderate left ventricular  hypertrophy. Left ventricular diastolic parameters are consistent with  Grade I diastolic dysfunction (impaired relaxation).  3. Right ventricular systolic function was not well visualized. The right  ventricular size is normal.  4. The mitral valve was not well visualized.  5. The aortic valve was not well visualized.  6. Tachycardia noted   Patient Profile     79 y.o. 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  Elevated troponin -Recent left heart cath with no obstructive disease -Likely demand ischemia -Ischemia work-up being planned -No indication for heparin drip from a cardiac perspective.  Recommend discontinuation if okay with primary team.  2.  Tachycardia -Telemetry monitor has been sinus  throughout -No evidence for atrial fibrillation or flutter -Cardiac due to acute illness. -Okay for  IV beta-blocker as needed  3. Renal dysfunction -on CRRT as per icu team  Please let us know if any further cardiac input is needed.       Signed, Kate Sable, MD  09/16/2019, 9:02 AM

## 2019-09-16 NOTE — Progress Notes (Signed)
Name: Olivia [name] MRN: [id number] DOB: 1940/10/22    ADMISSION DATE:  09/24/2019 CONSULTATION DATE:  09/12/2019  REFERRING MD :  Rufina Falco, NP  CHIEF COMPLAINT:  Unresponsiveness  BRIEF PATIENT DESCRIPTION:  79 y.o. Female, DNI, admitted 09/12/19 with Acute Hypoxic Respiratory Failure secondary to ? PE, severe metabolic acidosis, SVT vs. Atrial flutter w/ RVR, elevated troponin (NSTEMI vs. Demand ischemia), Cardiogenic shock (due to rapid rate) & septic shock (in setting of acute pancreatitis & Gastroenteritis), and AKI.  She has been placed on Bicarb gtt, Heparin & Amiodarone gtts.  Cardiology & Nephrology are consulted.  EVENTS  4/17: Admission to [place] 4/17: DNR in event of cardiopulm arrest but otherwise treat 4/17: Critically ill ~ SVT vs. A.flutter w/ RVR, Resp Failure, shock, AKI.  PCCM, Cardiology, & Nephrology consulted  4/17: Adenosine 6 mg IV x1 dose without noted improvement in HR; Cardizem d/c and placed on Amiodarone gtt 4/17: Persistent RVR despite Amio, trial of beta blocker with good response, esmolol gtt, amio d/c'd 4/19 - Family meeting with both daughters - patient had apperntly been taking multiple prescription and OTC meds as well as random natural remedies that she had ordered from television, family believes this is the cause of her current clinical demise.  4/20- reviewed testing with family. CTPE with no VTE , will stop heparin drip.  CTA abdomen and pelvis without mesenteric ischemia but did find significant swelling of GI tract and pancreas. 4/21 - patient remains encephalopathic on CRRT.  She has been weaned off of vasopressors   STUDIES:  4/16: CXR>> Cardiac shadows within normal limits. Lungs are well aerated bilaterally. No focal infiltrate or sizable effusion is seen. No bony abnormality is noted. 4/16: CT Head w/o contrast>>No acute intracranial process. 4/16: CT Cervical Spine>> Extensive multilevel cervical degenerative changes. No acute  fracture. 4/16: CT Abdomen & Pelvis>>1. Fluid within the distal esophagus, throughout prominent smallbowel, cecum and ascending colon with associated mesenteric edema. Overall findings consistent with generalized enteritis. Wall thickening of the duodenum proximal jejunum. 2. Mild peripancreatic fat stranding and edema most prominent about the head and uncinate process, can be seen with acute pancreatitis in the appropriate clinical setting. Recommend correlation with pancreatic enzymes. 3. Small volume abdominopelvic ascites. Generalized mesenteric and intra-abdominal edema. Trace pleural effusions/pleural thickening. 4. 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 5. Stable 2.1 cm left ovarian cyst dating back to 2017, likely benign. 6. Additional stable chronic findings as described.  4/17: Venous US Bilateral LE>> No DVT  CULTURES: SARS-CoV-2 PCR 4/16>> negative Influenza PCR 4/16>> negative Blood culture x2 4/16>> MSSA x1 only ? contam. Urine 4/16>>negative MRSA PCR 4/17>> negative GI panel PCR 4/16>> Blood cultures x2 4/18>>  ANTIBIOTICS: Cefepime 4/16>> 4/17 Flagyl 4/16>> 4/17 Vancomycin x1 dose in ED 4/16 Zosyn 4/17>> 4/17 Meropenem >> 4/17 Vanco >> 4/17   .  prismasol BGK 4/2.5 500 mL/hr at 09/15/19 0400  .  prismasol BGK 4/2.5 500 mL/hr at 09/15/19 0400  . sodium chloride 250 mL (09/12/19 0430)  . prismasol BGK 4/2.5 2,000 mL/hr at 09/16/19 0907  . sodium phosphate  Dextrose 5% IVPB 43 mL/hr at 09/16/19 1200      Allergies  Allergen Reactions  . Neomycin Anaphylaxis    blisters  . Lithium   . Meperidine Hcl     REACTION: UNSPECIFIED  . Pravastatin Sodium     Weakness, resolved after stopping medicine 2013  . Systane [Polyethyl Glycol-Propyl Glycol] Rash   .  REVIEW OF SYSTEMS: Unable  to obtain due to patient's encephalopathy  SUBJECTIVE:  Patient is very confused, pulling at lines test to have mittens to prevent pulling of lines particularly dialysis  catheter. Occasionally more lucid.  States abdominal pain better.  No other complaints reported.  Fidgety in bed.   VITAL SIGNS: Temp:  [98.4 F (36.9 C)-100.2 F (37.9 C)] 99 F (37.2 C) (04/21 0700) Pulse Rate:  [96-141] 118 (04/21 1000) Resp:  [20-43] 35 (04/21 1200) BP: (117-181)/(45-87) 144/68 (04/21 1200) SpO2:  [93 %-99 %] 96 % (04/21 1200) FiO2 (%):  [50 %-60 %] 60 % (04/21 0755) Weight:  [77.6 kg] 77.6 kg (04/21 0421)  PHYSICAL EXAMINATION: General:  Critically ill appearing female, laying in bed, on HFNC, with mild respiratory distress Neuro:  GCS10 with encephalopathy HEENT:  Atraumatic, normocephalic, neck supple, no JVD, left IJ Trialysis catheter Cardiovascular:  Tachycardia, regular rhythm, 1+ pulses, cool extremities though less dusky, no edema Lungs:  Clear to auscultation bilaterally, no wheezing or rales, tachycpnea, no overt accessory muscle use Abdomen:  Soft, non-tender to palpitation, mildly distended, no guarding or rebound tenderness Musculoskeletal:  Generalized weakness, no deformities, no edema Skin:  Cool and dry.  No obvious rashes, lesions, or ulcerations  Recent Labs  Lab 09/15/19 0451 09/15/19 1730 09/16/19 0356  NA 140 137 136  K 4.3 3.9 4.1  CL 104 104 103  CO2 23 18* 19*  BUN 14 17 18   CREATININE 0.75 0.86 0.76  GLUCOSE 133* 150* 148*   Recent Labs  Lab 09/14/19 0327 09/15/19 0451 09/16/19 0356  HGB 11.2* 10.9* 11.6*  HCT 32.7* 30.8* 33.5*  WBC 16.4* 16.9* 16.5*  PLT 174 107* 117*   CT ANGIO CHEST PE W OR WO CONTRAST  Result Date: 09/15/2019 CLINICAL DATA:  79 year old female with concern for pulmonary embolism. EXAM: CT ANGIOGRAPHY CHEST WITH CONTRAST TECHNIQUE: Multidetector CT imaging of the chest was performed using the standard protocol during bolus administration of intravenous contrast. Multiplanar CT image reconstructions and MIPs were obtained to evaluate the vascular anatomy. CONTRAST:  124mL OMNIPAQUE IOHEXOL 350 MG/ML  SOLN COMPARISON:  Chest CT dated 12/03/2018. FINDINGS: Evaluation is limited due to streak artifact caused by patient's arms. Cardiovascular: There is mild to moderate cardiomegaly. No pericardial effusion. The thoracic aorta is unremarkable. The origins of the great vessels of the aortic arch appear patent as visualized. Evaluation of the pulmonary arteries is very limited due to respiratory motion artifact and suboptimal opacification and visualization of the peripheral branches. No obvious large central pulmonary artery embolus identified. Mediastinum/Nodes: No hilar or mediastinal adenopathy noted. Evaluation however is very limited due to consolidative changes of the adjacent lungs. The esophagus is grossly unremarkable. There is a subcentimeter hypodense focus in the left thyroid lobe which may be artifactual or represent a nodule. No mediastinal fluid collection. Left IJ central venous line with tip close to the cavoatrial junction. Lungs/Pleura: There are moderate bilateral pleural effusions with complete consolidative changes and collapse of the lower lobes bilaterally with air bronchogram. This finding is new since the prior CT. There is partial consolidation of the lingula with air bronchograms as well as near complete consolidative changes of the right middle lobe. There is no pneumothorax. The central airways remain patent. Upper Abdomen: See the report for the abdomen pelvis. Musculoskeletal: Degenerative changes of the spine. No acute osseous pathology. Review of the MIP images confirms the above findings. IMPRESSION: 1. No definite CT evidence of central pulmonary artery embolus. Evaluation of the peripheral branches is very  limited due to respiratory motion artifact and suboptimal opacification of the peripheral branches. 2. Moderate bilateral pleural effusions with complete consolidative changes and collapse of the lower lobes bilaterally and near complete consolidative changes of the right middle  lobe. Findings may represent a combination of atelectasis and pneumonia. Clinical correlation and follow-up to resolution recommended. 3. Mild to moderate cardiomegaly. Electronically Signed   By: Anner Crete M.D.   On: 09/15/2019 16:48   CT Angio Abd/Pel w/ and/or w/o  Result Date: 09/15/2019 CLINICAL DATA:  79 year old with acute hypoxic respiratory failure. Evaluate for mesenteric ischemia. EXAM: CT ANGIOGRAPHY ABDOMEN AND PELVIS WITH CONTRAST AND WITHOUT CONTRAST TECHNIQUE: Multidetector CT imaging of the abdomen and pelvis was performed using the standard protocol during bolus administration of intravenous contrast. Multiplanar reconstructed images and MIPs were obtained and reviewed to evaluate the vascular anatomy. CONTRAST:  139mL OMNIPAQUE IOHEXOL 350 MG/ML SOLN COMPARISON:  CT abdomen and pelvis 09/21/2019 and CT abdomen/pelvis 06/30/2018 FINDINGS: VASCULAR Aorta: Small amount of atherosclerotic plaque in the infrarenal abdominal aorta without aneurysm. Celiac: There is not a true celiac trunk. Splenic artery originates directly from the aorta. Common hepatic artery originates directly from the abdominal aorta. Again noted are metallic densities near the gastric cardia and region of the left gastric artery. SMA: SMA is widely patent without significant plaque, stenosis or aneurysm. Renals: Bilateral renal arteries are patent without significant stenosis. 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 IMA: Proximal IMA is patent. Limited evaluation of the IMA beyond the origin. Inflow: Patent without evidence of aneurysm, dissection, vasculitis or significant stenosis. Proximal Outflow: Proximal femoral arteries are patent bilaterally. Stranding and evidence for a small hematoma in the left groin anterior to the left femoral vessels. No clear evidence for pseudoaneurysm. Veins:  Hepatic veins and portal veins are patent. Main mesenteric veins are patent. Splenic vein is patent. IVC and renal veins are patent. Iliac and proximal femoral veins appear to be patent. Central venous catheter in the right common femoral vein that terminates in the distal right common iliac vein. Review of the MIP images confirms the above findings. NON-VASCULAR Lower chest: Bilateral pleural effusions with extensive volume loss and consolidation in the lower lobes. Heart is enlarged for size. Hepatobiliary: Perihepatic ascites. Perihepatic ascites has slightly decreased from the recent comparison examination. Cholecystectomy. Low-density in the left hepatic lobe probably represents focal fat near the falciform ligament. Pancreas: Large amount of fluid and edema surrounding the pancreas. Cannot exclude small amount of necrosis in the pancreatic neck region. Spleen: Spleen is normal for size. Slightly decreased attenuation along the inferior aspect of spleen may be artifactual. Adrenals/Urinary Tract: Adrenal glands are within normal limits. There is a small fat attenuating lesion along the posterior right kidney that measures 0.9 cm. This structure has not significantly changed since 2020 and suggestive for a small angiomyolipoma. Catheter within the urinary bladder. Small amount of gas in the urinary bladder is likely iatrogenic. Stomach/Bowel: Normal appearance of stomach and duodenum without distension. There is diffuse distension of small bowel containing fluid. Degree of small bowel distension has slightly increased since 09/04/2019. Cannot exclude mild wall thickening involving the rectum, sigmoid colon and descending colon. Some of the small bowel loops in the anterior lower abdomen are poorly defined but there is no clear evidence for extraluminal gas or abscess formation. Lymphatic: No significant lymph node enlargement. Reproductive: Status post hysterectomy. Low-density left  ovarian or adnexal structure  measures 2.3 x 1.2 cm and this measured 2.6 x 1.6 cm in 2020. This is most compatible with a benign finding. Other: Again noted is free fluid in the pelvis. Slightly increased amount of pelvic fluid without gas. Edema and a small amount of fluid in the right paracolic gutter. Fluid between the right kidney and the liver. Small amount of ascites in the left paracolic gutter and inferior aspect of the spleen. Again noted are surgical clips scattered throughout the anterior abdominal walls. Increased subcutaneous edema from the recent comparison examination. Musculoskeletal: No acute bone abnormality. IMPRESSION: VASCULAR 1. Main visceral arteries are patent without significant stenosis. 2. Atherosclerotic disease in the abdominal aorta without aneurysm or dissection. Aortic Atherosclerosis (ICD10-I70.0). 3. Main venous structures are patent. 4. 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 5. Focal edema and probably a small hematoma just anterior to left femoral vessels. Question recent catheterization in this area. No clear evidence for a pseudoaneurysm. NON-VASCULAR 1. Extensive edema and fluid centered around the pancreas. Findings are concerning for pancreatitis and there may be a small amount of pancreatic necrosis in the region of the pancreatic neck. No evidence for pseudocyst formation. 2. Dilated loops of small bowel throughout the abdomen and pelvis containing fluid. The small bowel distension has progressed since 09/21/2019. Findings are nonspecific but could be related to an adynamic ileus. There is enhancement throughout the small bowel walls without significant small bowel wall thickening. 3. Possible wall thickening involving the descending colon and sigmoid colon. These areas are difficult to evaluate due to incomplete distension. Mild colitis cannot be excluded. 4. Persistent abdominopelvic ascites. 5. Extensive volume loss and consolidation at both lung bases with bilateral pleural  effusions. 6. Stable fat attenuating lesion in the right kidney that measures up to 0.9 cm. This is suggestive for a small angiomyolipoma. Electronically Signed   By: Markus Daft M.D.   On: 09/15/2019 17:22    ASSESSMENT / PLAN:  Acute Hypoxic Respiratory Failure     -due to pulmonary edema and compressive atelectasis from bilateral pleural effusions  - s/p CT chest -Supplemental O2 as needed to maintain O2 sats greater than 92% -High flow O2 as needed -changed IV abs to cefepime s/p merem previously -Heparin gtt is d/cd - no PE on CT chest -Lower extremity Dopplers negative for DVT  Circulatory shock           - present on admission          - at this point seems to be distributive due to GI losses -Initially with moderate pancreatitis, Lipase is normalized and there is small area of necrosis on pancrease per CT abd.  - Cannot rule out sepsis - procalicotin is profoundly elevated in context of AKI  - potentially CNS infection since patient does have concurrent encephalopathy - changed abs to cefepime - Patient had taken multiple OTC remedies and per family likely had overdose on unknown substance.    Encephalopathy - toxic metabolic vs septic     - procal >70 improved      -continue CRRT per nephro-appreciate input   - continue cefepime   AKI Anion Gap Metabolic Acidosis -Monitor I&O's / urinary output -Follow BMP -Ensure adequate renal perfusion -Avoid nephrotoxic agents as able -Replace electrolytes as indicated -IV Fluids -appreciate input from nephrology -On CRRT   BEST PRACTICES: DISPOSITION: ICU GOALS OF CARE: DNR in the event of cardiopulmonary arrest, aggressive therapy otherwise VTE PROPHYLAXIS/ANTICOAGULATION: Heparin q8h  Had [employer] today with both sisters at bedside, have offered CM and TCC services and they are appreciative.     Critical care provider statement:    Critical care time (minutes):  34   Critical care time was exclusive of:   Separately billable procedures and  treating other patients   Critical care was necessary to treat or prevent imminent or  life-threatening deterioration of the following conditions:  acute hypoxemic respiratory failure, AKI, multiple comorbid conditions   Critical care was time spent personally by me on the following  activities:  Development of treatment plan with patient or surrogate,  discussions with consultants, evaluation of patient's response to  treatment, examination of patient, obtaining history from patient or  surrogate, ordering and performing treatments and interventions, ordering  and review of laboratory studies and re-evaluation of patient's condition   I assumed direction of critical care for this patient from another  provider in my specialty: no    09/16/2019, 12:31 PM   *This note was dictated using voice recognition software/Dragon.  Despite best efforts to proofread, errors can occur which can change the meaning.  Any change was purely unintentional.    Ottie Glazier, M.D.  Pulmonary & [place]

## 2019-09-16 NOTE — Progress Notes (Signed)
[facility]  ROUNDING NOTE   Subjective:  Patient remains critically ill. Tolerating CRRT. Urine output was 640 cc over the preceding 24 hours.   Objective:  Vital signs in last 24 hours:  Temp:  [98.4 F (36.9 C)-100.2 F (37.9 C)] 99 F (37.2 C) (04/21 0700) Pulse Rate:  [96-141] 118 (04/21 1000) Resp:  [20-43] 36 (04/21 1300) BP: (117-181)/(45-87) 120/78 (04/21 1300) SpO2:  [93 %-99 %] 98 % (04/21 1300) FiO2 (%):  [60 %] 60 % (04/21 0755) Weight:  [77.6 kg] 77.6 kg (04/21 0421)  Weight change:  Filed Weights   09/13/19 0316 09/14/19 0318 09/16/19 0421  Weight: 79.3 kg 77.7 kg 77.6 kg    Intake/Output: I/O last 3 completed shifts: In: 1358.5 [I.V.:570.4; Other:42; IV Piggyback:746.1] Out: 2377 [Urine:780; MGQQP:6195]   Intake/Output this shift:  Total I/O In: 214.8 [I.V.:46.8; IV Piggyback:168] Out: 441 [Urine:35; Other:406]  Physical Exam: General: Critically ill-appearing  Head: Normocephalic, atraumatic. Moist oral mucosal membranes  Eyes: Anicteric  Neck: Supple, trachea midline  Lungs:  Scattered rhonchi, normal effort  Heart: S1S2, tachycardic  Abdomen:  Mild diffuse tenderness, distention noted  Extremities: Trace peripheral edema.  Neurologic: Lethargic   Skin: No lesions  Access: Left IJ temporary dialysis catheter    Basic Metabolic Panel: Recent Labs  Lab 09/12/19 1005 09/12/19 1440 09/13/19 0319 09/13/19 1611 09/14/19 0327 09/14/19 0327 09/14/19 1644 09/14/19 1644 09/15/19 0451 09/15/19 1730 09/16/19 0356  NA  --    < > 138   < > 137  --  138  --  140 137 136  K  --    < > 4.0   < > 4.5  --  4.1  --  4.3 3.9 4.1  CL  --    < > 104   < > 103  --  104  --  104 104 103  CO2  --    < > 20*   < > 19*  --  17*  --  23 18* 19*  GLUCOSE  --    < > 118*   < > 129*  --  125*  --  133* 150* 148*  BUN  --    < > 36*   < > 17  --  15  --  14 17 18   CREATININE  --    < > 1.82*   < > 0.91  --  0.93  --  0.75 0.86 0.76  CALCIUM  --     < > 6.7*   < > 7.6*   < > 7.6*   < > 7.7* 7.7* 7.5*  MG 1.7  --  1.9  --  2.6*  --   --   --  2.6*  --  2.4  PHOS 3.3   < > 3.4   < > 2.5  --  1.4*  --  1.5* 1.7* 1.2*   < > = values in this interval not displayed.    Liver Function Tests: Recent Labs  Lab 09/21/2019 1841 09/23/2019 1841 09/12/19 0446 09/12/19 1733 09/14/19 0327 09/14/19 1644 09/15/19 0451 09/15/19 1730 09/16/19 0356  AST 78*  --  177*  --  232*  --   --   --   --   ALT 33  --  58*  --  168*  --   --   --   --   ALKPHOS 56  --  44  --  79  --   --   --   --  BILITOT 1.1  --  1.4*  --  1.4*  --   --   --   --   PROT 6.5  --  5.1*  --  5.7*  --   --   --   --   ALBUMIN 3.5   < > 2.7*   < > 2.5* 2.8* 2.5* 2.4* 2.3*   < > = values in this interval not displayed.   Recent Labs  Lab 09/17/2019 2051 09/12/19 0446 09/12/19 0703 09/13/19 0319 09/15/19 1730  LIPASE 517*  --  200* 49 15  AMYLASE  --  320*  --  98  --    No results for input(s): AMMONIA in the last 168 hours.  CBC: Recent Labs  Lab 09/04/2019 1841 09/15/2019 1841 09/12/19 0446 09/13/19 0319 09/14/19 0327 09/15/19 0451 09/16/19 0356  WBC 15.1*   < > 9.5 11.5* 16.4* 16.9* 16.5*  NEUTROABS 12.7*  --   --   --   --   --   --   HGB 16.3*   < > 14.2 12.3 11.2* 10.9* 11.6*  HCT 48.0*   < > 42.3 35.5* 32.7* 30.8* 33.5*  MCV 96.6   < > 98.4 94.2 95.3 92.8 93.8  PLT 298   < > 234 234 174 107* 117*   < > = values in this interval not displayed.    Cardiac Enzymes: No results for input(s): CKTOTAL, CKMB, CKMBINDEX, TROPONINI in the last 168 hours.  BNP: Invalid input(s): POCBNP  CBG: Recent Labs  Lab 09/12/19 0102 09/12/19 1944  GLUCAP 129* 114*    Microbiology: Results for orders placed or performed during the hospital encounter of 09/19/2019  Blood culture (routine x 2)     Status: Abnormal   Collection Time: 09/03/2019  6:41 PM   Specimen: BLOOD  Result Value Ref Range Status   Specimen Description   Final    BLOOD RIGHT ARM Performed  at [facility], [address], [place], [place] [address]    Special Requests   Final    BOTTLES DRAWN AEROBIC AND ANAEROBIC Blood Culture results may not be optimal due to an inadequate volume of blood received in culture bottles Performed at [facility], [address] [place], [place] [address]    Culture  Setup Time   Final    AEROBIC BOTTLE ONLY GRAM POSITIVE COCCI CRITICAL RESULT CALLED TO, READ BACK BY AND VERIFIED WITH: SCOTT HALL ON 09/12/19 AT 2210 [facility]    Culture (A)  Final    STAPHYLOCOCCUS SPECIES (COAGULASE NEGATIVE) THE SIGNIFICANCE OF ISOLATING THIS ORGANISM FROM A SINGLE SET OF BLOOD CULTURES WHEN MULTIPLE SETS ARE DRAWN IS UNCERTAIN. PLEASE NOTIFY THE MICROBIOLOGY DEPARTMENT WITHIN ONE WEEK IF SPECIATION AND SENSITIVITIES ARE REQUIRED. Performed at [facility], [place] [address] [place], [place] [address]    Report Status 09/14/2019 FINAL  Final  Blood Culture ID Panel (Reflexed)     Status: Abnormal   Collection Time: 08/31/2019  6:41 PM  Result Value Ref Range Status   Enterococcus species NOT DETECTED NOT DETECTED Final   Listeria monocytogenes NOT DETECTED NOT DETECTED Final   Staphylococcus species DETECTED (A) NOT DETECTED Final    Comment: Methicillin (oxacillin) susceptible coagulase negative staphylococcus. Possible blood culture contaminant (unless isolated from more than one blood culture draw or clinical case suggests pathogenicity). No antibiotic treatment is indicated for blood  culture contaminants. CRITICAL RESULT CALLED TO, READ BACK BY AND VERIFIED WITH: SCOTT HALL ON  09/12/19 AT 2210 [facility]    Staphylococcus aureus (BCID) NOT DETECTED NOT DETECTED Final   Methicillin resistance NOT DETECTED NOT DETECTED Final   Streptococcus species NOT DETECTED NOT DETECTED Final   Streptococcus agalactiae NOT DETECTED NOT DETECTED Final   Streptococcus pneumoniae NOT DETECTED NOT DETECTED Final   Streptococcus pyogenes NOT DETECTED NOT  DETECTED Final   Acinetobacter baumannii NOT DETECTED NOT DETECTED Final   Enterobacteriaceae species NOT DETECTED NOT DETECTED Final   Enterobacter cloacae complex NOT DETECTED NOT DETECTED Final   Escherichia coli NOT DETECTED NOT DETECTED Final   Klebsiella oxytoca NOT DETECTED NOT DETECTED Final   Klebsiella pneumoniae NOT DETECTED NOT DETECTED Final   Proteus species NOT DETECTED NOT DETECTED Final   Serratia marcescens NOT DETECTED NOT DETECTED Final   Haemophilus influenzae NOT DETECTED NOT DETECTED Final   Neisseria meningitidis NOT DETECTED NOT DETECTED Final   Pseudomonas aeruginosa NOT DETECTED NOT DETECTED Final   Candida albicans NOT DETECTED NOT DETECTED Final   Candida glabrata NOT DETECTED NOT DETECTED Final   Candida krusei NOT DETECTED NOT DETECTED Final   Candida parapsilosis NOT DETECTED NOT DETECTED Final   Candida tropicalis NOT DETECTED NOT DETECTED Final    Comment: Performed at [facility], [address] [place], [place] [address]  Respiratory Panel by RT PCR (Flu A&B, Covid) - Nasopharyngeal Swab     Status: None   Collection Time: 09/19/2019  7:14 PM   Specimen: Nasopharyngeal Swab  Result Value Ref Range Status   SARS Coronavirus 2 by RT PCR NEGATIVE NEGATIVE Final    Comment: (NOTE) SARS-CoV-2 target nucleic acids are NOT DETECTED. The SARS-CoV-2 RNA is generally detectable in upper respiratoy specimens during the acute phase of infection. The lowest concentration of SARS-CoV-2 viral copies this assay can detect is 131 copies/mL. A negative result does not preclude SARS-Cov-2 infection and should not be used as the sole basis for treatment or other patient management decisions. A negative result may occur with  improper specimen collection/handling, submission of specimen other than nasopharyngeal swab, presence of viral mutation(s) within the areas targeted by this assay, and inadequate number of viral copies (<131 copies/mL). A negative  result must be combined with clinical observations, patient history, and epidemiological information. The expected result is Negative. Fact Sheet for Patients:  PinkCheek.be Fact Sheet for Healthcare Providers:  GravelBags.it This test is not yet ap proved or cleared by the [place] FDA and  has been authorized for detection and/or diagnosis of SARS-CoV-2 by FDA under an Emergency Use Authorization (EUA). This EUA will remain  in effect (meaning this test can be used) for the duration of the COVID-19 declaration under Section 564(b)(1) of the Act, 21 U.S.C. section 360bbb-3(b)(1), unless the authorization is terminated or revoked sooner.    Influenza A by PCR NEGATIVE NEGATIVE Final   Influenza B by PCR NEGATIVE NEGATIVE Final    Comment: (NOTE) The Xpert Xpress SARS-CoV-2/FLU/RSV assay is intended as an aid in  the diagnosis of influenza from Nasopharyngeal swab specimens and  should not be used as a sole basis for treatment. Nasal washings and  aspirates are unacceptable for Xpert Xpress SARS-CoV-2/FLU/RSV  testing. Fact Sheet for Patients: PinkCheek.be Fact Sheet for Healthcare Providers: GravelBags.it This test is not yet approved or cleared by the [place] FDA and  has been authorized for detection and/or diagnosis of SARS-CoV-2 by  FDA under an Emergency Use Authorization (EUA). This EUA will remain  in effect (meaning this test can  be used) for the duration of the  Covid-19 declaration under Section 564(b)(1) of the Act, 21  U.S.C. section 360bbb-3(b)(1), unless the authorization is  terminated or revoked. Performed at [facility], [address] [place], [place] [address]   Blood culture (routine x 2)     Status: None   Collection Time: 09/14/2019  7:35 PM   Specimen: BLOOD  Result Value Ref Range Status   Specimen Description BLOOD  RIGHT ANTECUBITAL  Final   Special Requests   Final    BOTTLES DRAWN AEROBIC AND ANAEROBIC Blood Culture results may not be optimal due to an inadequate volume of blood received in culture bottles   Culture   Final    NO GROWTH 5 DAYS Performed at [facility], [address], [place], [place] [address]    Report Status 09/16/2019 FINAL  Final  Urine culture     Status: None   Collection Time: 09/03/2019 11:51 PM   Specimen: Urine, Random  Result Value Ref Range Status   Specimen Description   Final    URINE, RANDOM Performed at [facility], [address], [place], [place] [address]    Special Requests   Final    NONE Performed at [facility], [address] [place] [address] [place], [place] [address]    Culture   Final    NO GROWTH Performed at [facility], [place] [address] [place], [place] [address]    Report Status 09/13/2019 FINAL  Final  MRSA PCR Screening     Status: None   Collection Time: 09/12/19  1:07 AM   Specimen: Nasopharyngeal  Result Value Ref Range Status   MRSA by PCR NEGATIVE NEGATIVE Final    Comment:        The GeneXpert MRSA Assay (FDA approved for NASAL specimens only), is one component of a comprehensive MRSA colonization surveillance program. It is not intended to diagnose MRSA infection nor to guide or monitor treatment for MRSA infections. Performed at [facility], [address] [place], [place] [address]   CULTURE, BLOOD (ROUTINE X 2) w Reflex to ID Panel     Status: None (Preliminary result)   Collection Time: 09/13/19  8:37 PM   Specimen: BLOOD LEFT HAND  Result Value Ref Range Status   Specimen Description BLOOD LEFT HAND  Final   Special Requests   Final    BOTTLES DRAWN AEROBIC AND ANAEROBIC Blood Culture adequate volume   Culture   Final    NO GROWTH 3 DAYS Performed at [facility], [address], [place], [place] [address]    Report Status PENDING  Incomplete  CULTURE, BLOOD (ROUTINE  X 2) w Reflex to ID Panel     Status: None (Preliminary result)   Collection Time: 09/13/19  8:52 PM   Specimen: BLOOD RIGHT HAND  Result Value Ref Range Status   Specimen Description BLOOD RIGHT HAND  Final   Special Requests   Final    BOTTLES DRAWN AEROBIC AND ANAEROBIC Blood Culture results may not be optimal due to an inadequate volume of blood received in culture bottles   Culture   Final    NO GROWTH 3 DAYS Performed at [facility], [address] [place], [place] [address]    Report Status PENDING  Incomplete    Coagulation Studies: No results for input(s): LABPROT, INR in the last 72 hours.  Urinalysis: No results for input(s): COLORURINE, LABSPEC, [place], [place], [employer], [place], [place], [place],  UROBILINOGEN, NITRITE, LEUKOCYTESUR in the last 72 hours.  Invalid input(s): APPERANCEUR    Imaging: CT ANGIO CHEST PE W OR WO CONTRAST  Result Date: 09/15/2019 CLINICAL DATA:  79 year old female with concern for pulmonary embolism. EXAM: CT ANGIOGRAPHY CHEST WITH CONTRAST TECHNIQUE: Multidetector CT imaging of the chest was performed using the standard protocol during bolus administration of intravenous contrast. Multiplanar CT image reconstructions and MIPs were obtained to evaluate the vascular anatomy. CONTRAST:  143m OMNIPAQUE IOHEXOL 350 MG/ML SOLN COMPARISON:  Chest CT dated 12/03/2018. FINDINGS: Evaluation is limited due to streak artifact caused by patient's arms. Cardiovascular: There is mild to moderate cardiomegaly. No pericardial effusion. The thoracic aorta is unremarkable. The origins of the great vessels of the aortic arch appear patent as visualized. Evaluation of the pulmonary arteries is very limited due to respiratory motion artifact and suboptimal opacification and visualization of the peripheral branches. No obvious large central pulmonary artery embolus identified. Mediastinum/Nodes: No hilar or mediastinal adenopathy noted. Evaluation  however is very limited due to consolidative changes of the adjacent lungs. The esophagus is grossly unremarkable. There is a subcentimeter hypodense focus in the left thyroid lobe which may be artifactual or represent a nodule. No mediastinal fluid collection. Left IJ central venous line with tip close to the cavoatrial junction. Lungs/Pleura: There are moderate bilateral pleural effusions with complete consolidative changes and collapse of the lower lobes bilaterally with air bronchogram. This finding is new since the prior CT. There is partial consolidation of the lingula with air bronchograms as well as near complete consolidative changes of the right middle lobe. There is no pneumothorax. The central airways remain patent. Upper Abdomen: See the report for the abdomen pelvis. Musculoskeletal: Degenerative changes of the spine. No acute osseous pathology. Review of the MIP images confirms the above findings. IMPRESSION: 1. No definite CT evidence of central pulmonary artery embolus. Evaluation of the peripheral branches is very limited due to respiratory motion artifact and suboptimal opacification of the peripheral branches. 2. Moderate bilateral pleural effusions with complete consolidative changes and collapse of the lower lobes bilaterally and near complete consolidative changes of the right middle lobe. Findings may represent a combination of atelectasis and pneumonia. Clinical correlation and follow-up to resolution recommended. 3. Mild to moderate cardiomegaly. Electronically Signed   By: AAnner CreteM.D.   On: 09/15/2019 16:48   CT Angio Abd/Pel w/ and/or w/o  Result Date: 09/15/2019 CLINICAL DATA:  79year old with acute hypoxic respiratory failure. Evaluate for mesenteric ischemia. EXAM: CT ANGIOGRAPHY ABDOMEN AND PELVIS WITH CONTRAST AND WITHOUT CONTRAST TECHNIQUE: Multidetector CT imaging of the abdomen and pelvis was performed using the standard protocol during bolus administration of  intravenous contrast. Multiplanar reconstructed images and MIPs were obtained and reviewed to evaluate the vascular anatomy. CONTRAST:  1018mOMNIPAQUE IOHEXOL 350 MG/ML SOLN COMPARISON:  CT abdomen and pelvis 09/03/2019 and CT abdomen/pelvis 06/30/2018 FINDINGS: VASCULAR Aorta: Small amount of atherosclerotic plaque in the infrarenal abdominal aorta without aneurysm. Celiac: There is not a true celiac trunk. Splenic artery originates directly from the aorta. Common hepatic artery originates directly from the abdominal aorta. Again noted are metallic densities near the gastric cardia and region of the left gastric artery. SMA: SMA is widely patent without significant plaque, stenosis or aneurysm. Renals: Bilateral renal arteries are patent without significant stenosis. 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 IMA: Proximal IMA is patent. Limited evaluation  of the IMA beyond the origin. Inflow: Patent without evidence of aneurysm, dissection, vasculitis or significant stenosis. Proximal Outflow: Proximal femoral arteries are patent bilaterally. Stranding and evidence for a small hematoma in the left groin anterior to the left femoral vessels. No clear evidence for pseudoaneurysm. Veins: Hepatic veins and portal veins are patent. Main mesenteric veins are patent. Splenic vein is patent. IVC and renal veins are patent. Iliac and proximal femoral veins appear to be patent. Central venous catheter in the right common femoral vein that terminates in the distal right common iliac vein. Review of the MIP images confirms the above findings. NON-VASCULAR Lower chest: Bilateral pleural effusions with extensive volume loss and consolidation in the lower lobes. Heart is enlarged for size. Hepatobiliary: Perihepatic ascites. Perihepatic ascites has slightly decreased from the recent comparison examination.  Cholecystectomy. Low-density in the left hepatic lobe probably represents focal fat near the falciform ligament. Pancreas: Large amount of fluid and edema surrounding the pancreas. Cannot exclude small amount of necrosis in the pancreatic neck region. Spleen: Spleen is normal for size. Slightly decreased attenuation along the inferior aspect of spleen may be artifactual. Adrenals/Urinary Tract: Adrenal glands are within normal limits. There is a small fat attenuating lesion along the posterior right kidney that measures 0.9 cm. This structure has not significantly changed since 2020 and suggestive for a small angiomyolipoma. Catheter within the urinary bladder. Small amount of gas in the urinary bladder is likely iatrogenic. Stomach/Bowel: Normal appearance of stomach and duodenum without distension. There is diffuse distension of small bowel containing fluid. Degree of small bowel distension has slightly increased since 09/12/2019. Cannot exclude mild wall thickening involving the rectum, sigmoid colon and descending colon. Some of the small bowel loops in the anterior lower abdomen are poorly defined but there is no clear evidence for extraluminal gas or abscess formation. Lymphatic: No significant lymph node enlargement. Reproductive: Status post hysterectomy. Low-density left ovarian or adnexal structure measures 2.3 x 1.2 cm and this measured 2.6 x 1.6 cm in 2020. This is most compatible with a benign finding. Other: Again noted is free fluid in the pelvis. Slightly increased amount of pelvic fluid without gas. Edema and a small amount of fluid in the right paracolic gutter. Fluid between the right kidney and the liver. Small amount of ascites in the left paracolic gutter and inferior aspect of the spleen. Again noted are surgical clips scattered throughout the anterior abdominal walls. Increased subcutaneous edema from the recent comparison examination. Musculoskeletal: No acute bone abnormality. IMPRESSION:  VASCULAR 1. Main visceral arteries are patent without significant stenosis. 2. Atherosclerotic disease in the abdominal aorta without aneurysm or dissection. Aortic Atherosclerosis (ICD10-I70.0). 3. Main venous structures are patent. 4. 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 5. Focal edema and probably a small hematoma just anterior to left femoral vessels. Question recent catheterization in this area. No clear evidence for a pseudoaneurysm. NON-VASCULAR 1. Extensive edema and fluid centered around the pancreas. Findings are concerning for pancreatitis and there may be a small amount of pancreatic necrosis in the region of the pancreatic neck. No evidence for pseudocyst formation. 2. Dilated loops of small bowel throughout the abdomen and pelvis containing fluid. The small bowel distension has progressed since 08/27/2019. Findings are nonspecific but could be related to an adynamic ileus. There is enhancement throughout the small bowel walls without significant small bowel wall thickening. 3. Possible wall thickening involving the descending colon and sigmoid colon. These areas are difficult to evaluate due  to incomplete distension. Mild colitis cannot be excluded. 4. Persistent abdominopelvic ascites. 5. Extensive volume loss and consolidation at both lung bases with bilateral pleural effusions. 6. Stable fat attenuating lesion in the right kidney that measures up to 0.9 cm. This is suggestive for a small angiomyolipoma. Electronically Signed   By: Markus Daft M.D.   On: 09/15/2019 17:22     Medications:   .  prismasol BGK 4/2.5 500 mL/hr at 09/16/19 1336  .  prismasol BGK 4/2.5 500 mL/hr at 09/15/19 0400  . sodium chloride 250 mL (09/12/19 0430)  . ceFEPime (MAXIPIME) IV 2 g (09/16/19 1308)  . prismasol BGK 4/2.5 2,000 mL/hr at 09/16/19 1336  . sodium phosphate  Dextrose 5% IVPB 43 mL/hr at 09/16/19 1300   . Chlorhexidine Gluconate Cloth  6 each Topical Daily  . heparin  injection (subcutaneous)  5,000 Units Subcutaneous Q8H  . mouth rinse  15 mL Mouth Rinse BID  . pantoprazole (PROTONIX) IV  40 mg Intravenous Q24H  . thiamine injection  100 mg Intravenous Q24H   heparin, metoprolol tartrate, ondansetron **OR** ondansetron (ZOFRAN) IV  Assessment/ Plan:  79 y.o. female past history of degenerative disc disease, breast cancer, cervical cancer status post hysterectomy, depression, GERD, hyperlipidemia, hypertension, hypothyroidism, osteophytes, urinary incontinence admitted with nausea, vomiting, diarrhea and found to have generalized enteritis along with pancreatitis.  1.  Acute kidney injury secondary to acute tubular necrosis/chronic kidney disease stage IIIa baseline EGFR 59.  Patient appears to have severe acute kidney injury now likely related to intraabdominal process and hypotension.  Patient received contrast 09/15/2019. -Patient had contrast exposure yesterday.  Maintain the patient on CRRT for now.  Net ultrafiltration goal is to be negative by 1.2 L over 24 hours.  2.  Hypotension.  Patient remains off of pressors at the moment.  Continue to monitor blood pressure trend.  3.  Metabolic acidosis.  Serum bicarbonate a bit lower today at 19.  Continue CRRT for this issue.   LOS: 5 Abdullahi Vallone 4/21/20211:40 PM

## 2019-09-16 NOTE — Progress Notes (Signed)
Per Nephrology, hold off on starting lasix drip.  UF started at 35ml/hr.  Patient tolerated UF well overnight.

## 2019-09-16 NOTE — Progress Notes (Signed)
[place] for Heparin Indication: A. Flutter and presumed PE   Allergies  Allergen Reactions  . Neomycin Anaphylaxis    blisters  . Lithium   . Meperidine Hcl     REACTION: UNSPECIFIED  . Pravastatin Sodium     Weakness, resolved after stopping medicine 2013  . Systane [Polyethyl Glycol-Propyl Glycol] Rash    Patient Measurements: Height: 5\' 2"  (157.5 cm) Weight: 77.6 kg (171 lb 1.2 oz) IBW/kg (Calculated) : 50.1 HEPARIN DW (KG): 67.1  Vital Signs: Temp: 99.2 F (37.3 C) (04/21 0000) Temp Source: Axillary (04/21 0000) BP: 159/65 (04/21 0400) Pulse Rate: 103 (04/21 0400)  Labs: Recent Labs    09/13/19 0815 09/13/19 1136 09/13/19 1806 09/13/19 2008 09/14/19 0327 09/14/19 1303 09/15/19 0451 09/15/19 0528 09/15/19 1359 09/15/19 1730 09/15/19 1901 09/16/19 0356  HGB  --   --   --    < > 11.2*  --  10.9*  --   --   --   --  11.6*  HCT  --   --   --   --  32.7*  --  30.8*  --   --   --   --  33.5*  PLT  --   --   --   --  174  --  107*  --   --   --   --  117*  HEPARINUNFRC  --    < >  --    < > 0.20*   < >  --    < > 0.56  --  0.89* 3.24*  CREATININE  --    < >  --    < > 0.91   < > 0.75  --   --  0.86  --  0.76  TROPONINIHS 542*  --  381*  --   --   --   --   --   --   --   --   --    < > = values in this interval not displayed.    Estimated Creatinine Clearance: 55.9 mL/min (by C-G formula based on SCr of 0.76 mg/dL).   Medical History: Past Medical History:  Diagnosis Date  . Agitation   . Allergic    Immunotherapy  . Allergic rhinitis   . ALLERGIC RHINITIS 10/26/2006   Qualifier: Diagnosis of  By: Council Mechanic MD, Hilaria Ota   . Anemia, iron deficiency   . Asthma   . Barrett's esophagus 01/04/2011  . Bipolar affective disorder ([place])   . BIPOLAR AFFECTIVE DISORDER, DEPRESSED, HX OF 10/25/2006   Qualifier: Diagnosis of  By: Zara Council LPN, Mariann Laster    . Blood in stool    Via stool cards  . Blood transfusion without reported  diagnosis   . Breast cancer ([place]) 1973   Bilateral Mastectomies  . Carpal tunnel syndrome   . Cervical cancer [facility])    Hysterectomy  . Chest pain   . Chronic kidney disease   . 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 Collagen vascular disease ([place])   . Constipation    Mild  . Depressed   . Diarrhea   . DVT (deep venous thrombosis) ([place])   . Fibromyalgia   . Gastric ulcer    Acute  . Gastritis    Barretts, [place]  . GERD (gastroesophageal reflux disease)   . Gross hematuria 06/23/2015  . History of kidney stones   . Hyperlipidemia   . Hypertension   . HYPERTENSION 10/26/2006  Qualifier: Diagnosis of  By: Council Mechanic MD, Hilaria Ota   . Hypothyroidism   . 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 Ophthalmoplegia    Dr. Nanine Means at [facility]  . Osteoarthritis    Dr. Marveen Reeks Dr. Jefm Bryant  . Osteoarthrosis    Dr. Marveen Reeks  . PAF (paroxysmal atrial fibrillation) ([place])   . Pain 01/2010   Declined by [facility] pain clinic  . Pneumonia    with trach / 2 YOA and 14 YOA  . PONV (postoperative nausea and vomiting)    past hx.-none recent  . Renal artery stenosis ([place]) 01/04/2011   S/p angioplasty of vessels x2   . Rheumatoid arthritis(714.0)    Dr. Jefm Bryant  . Sinusitis, acute 07/09/2012     Assessment: Pharmacy consulted for heparin infusion dosing and monitor for atrial flutter and presumed PE in 79 yo female. Patient is admitted with acute metabolic encephalopathy and sepsis. No anticoagulants per PTA med list.   4/20 1359 HL 0.56  4/20 1901 HL 0.89   Goal of Therapy:  Heparin level 0.3-0.7 units/ml Monitor platelets by anticoagulation protocol: Yes   Plan:  04/21 @ 0400 HL 3.24 supratherapeutic. RN states patient is not currently bleeding and that sample was drawn from central line while drip was paused. Will hold drip until 0700 and will restart it at 1200 units/hr and will recheck HL at 1500 and continue to monitor. CBC and plts appear stable will  continue to monitor.  Tobie Lords, PharmD, BCPS Clinical Pharmacist 09/16/2019 4:57 AM

## 2019-09-16 NOTE — Progress Notes (Signed)
[place] visited pt. while rounding on ICU; pt. asleep but restless, w/mitts on; pt.'s dtr. Olivia [name] @ bedside, tearful.  Dtr. grateful for [facility] visit; shared that pt. had taken her friend Olivia [name] to a doctor's appointment and went to lunch w/him several days ago... Pt. went home and called dtr. a few hrs. later saying she was experiencing pain 'like I got run through by a sword'  Dtr. contacted friend Olivia [name] to come check on pt.; Olivia [name] found pt. collapsed and called EMS.  Pt. found to be septic, but source of infection is unknown (not food poisoning as family initially suspected). Pt. about to be taken to a scan w/contrast dye to try to pinpoint location of infection; family worried as there are risks to this procedure.  Pt.'s other dtr. Olivia [name] entered; both dtrs. requested prayer: 'we have faith, but we need strength.'  Olivia [name] prayed for dtrs. and pt. and remains available.    09/15/19 1400  Clinical Encounter Type  Visited With Patient and family together  Visit Type Initial;Spiritual support;Social support;Psychological support;Critical Care  Referral From  (Routine Rounding)  Consult/Referral To Chaplain  Spiritual Encounters  Spiritual Needs Emotional;Prayer  Stress Factors  Family Stress Factors Loss of control;Major life changes;Health changes;Lack of knowledge

## 2019-09-16 NOTE — Progress Notes (Signed)
Patient unable to follow simple commands throughout shift, responds to pain. Dr. Delilah Shan made aware, patient left pupil size is a 4 and right pupil size a 2. Per Dr Delilah Shan discontinued fentanyl. At this time patient does not appear to be in any pain, continuously monitoring. Will monitor patients neuro, if no improvements possible MRI tomorrow. Minimal output through shift. CRRT running with no complications. Heparin discontinued this am. Replaced phosphorus, pharmacy consulted. Dr Delilah Shan spoke with both of patients daughter this afternoon. Consulted palliative care, pending consult.

## 2019-09-16 NOTE — Progress Notes (Signed)
Pharmacy Antibiotic Note  Olivia [name] is a 79 y.o. female admitted on 09/17/2019. Pharmacy has been consulted for cefepime dosing.  Plan: Cefepime 2 g IV q12h (dosing for CRRT). Will f/u Nephrology plan and adjust dose accordingly.  Height: 5\' 2"  (157.5 cm) Weight: 77.6 kg (171 lb 1.2 oz) IBW/kg (Calculated) : 50.1  Temp (24hrs), Avg:99.2 F (37.3 C), Min:98.4 F (36.9 C), Max:100.2 F (37.9 C)  Recent Labs  Lab 09/12/19 0446 09/12/19 0446 09/12/19 0703 09/12/19 1154 09/12/19 1440 09/12/19 1733 09/12/19 1733 09/13/19 0319 09/13/19 1611 09/14/19 0327 09/14/19 1644 09/15/19 0451 09/15/19 1730 09/16/19 0356  WBC 9.5  --   --   --   --   --   --  11.5*  --  16.4*  --  16.9*  --  16.5*  CREATININE 3.32*  --   --   --    < > 3.49*   < > 1.82*   < > 0.91 0.93 0.75 0.86 0.76  LATICACIDVEN 3.8*   < > 2.8* 3.0*  --  3.2*  --  3.9*  --  1.7  --   --   --   --    < > = values in this interval not displayed.    Estimated Creatinine Clearance: 55.9 mL/min (by C-G formula based on SCr of 0.76 mg/dL).    Allergies  Allergen Reactions  . Neomycin Anaphylaxis    blisters  . Lithium   . Meperidine Hcl     REACTION: UNSPECIFIED  . Pravastatin Sodium     Weakness, resolved after stopping medicine 2013  . Systane [Polyethyl Glycol-Propyl Glycol] Rash      Thank you for allowing pharmacy to be a part of this patient's care.  Tawnya Crook, PharmD 09/16/2019 1:35 PM

## 2019-09-16 NOTE — Progress Notes (Signed)
[place] encountered pt.'s dtrs in ICU waiting rm; dtrs said pt. had made it through procedure yesterday --> some possible infection sources ruled out but no root cause found yet.  [place] plans to follow up w/pt.Regis Bill later today if possible.    09/16/19 1330  Clinical Encounter Type  Visited With Family  Visit Type Follow-up;Psychological support;Social support;Critical Care  Consult/Referral To Chaplain  Spiritual Encounters  Spiritual Needs Emotional

## 2019-09-16 NOTE — Consult Note (Signed)
PHARMACY CONSULT NOTE - FOLLOW UP  Pharmacy Consult for Electrolyte Monitoring and Replacement   Recent Labs: Potassium (mmol/L)  Date Value  09/16/2019 4.1  08/29/2012 3.9   Magnesium (mg/dL)  Date Value  09/16/2019 2.4   Calcium (mg/dL)  Date Value  09/16/2019 7.5 (L)   Calcium, Total (mg/dL)  Date Value  08/29/2012 8.9   Albumin (g/dL)  Date Value  09/16/2019 2.3 (L)  08/29/2012 4.0   Phosphorus (mg/dL)  Date Value  09/16/2019 1.2 (L)   Sodium (mmol/L)  Date Value  09/16/2019 136  08/29/2012 142   Corrected Calcium: 8.8   Assessment: Pharmacy consulted for electrolyte monitoring and replacement for 79 yo female admitted with AKI. Patient initiated on CRRT 4/17.   Goal of Therapy:  Electrolytes WNL   Plan:  Sodium phos 30 mmol IV x 1. Discussed with Nephrology in setting of CRRT. Patient may likely need additional replacement.   Tawnya Crook, PharmD Clinical Pharmacist 09/16/2019 1:39 PM

## 2019-09-17 DIAGNOSIS — Z7189 Other specified counseling: Secondary | ICD-10-CM

## 2019-09-17 DIAGNOSIS — K559 Vascular disorder of intestine, unspecified: Secondary | ICD-10-CM

## 2019-09-17 DIAGNOSIS — J9601 Acute respiratory failure with hypoxia: Secondary | ICD-10-CM

## 2019-09-17 LAB — RENAL FUNCTION PANEL
Albumin: 2.3 g/dL — ABNORMAL LOW (ref 3.5–5.0)
Albumin: 2.3 g/dL — ABNORMAL LOW (ref 3.5–5.0)
Anion gap: 12 (ref 5–15)
Anion gap: 11 (ref 5–15)
BUN: 20 mg/dL (ref 8–23)
BUN: 18 mg/dL (ref 8–23)
CO2: 22 mmol/L (ref 22–32)
CO2: 23 mmol/L (ref 22–32)
Calcium: 7.5 mg/dL — ABNORMAL LOW (ref 8.9–10.3)
Calcium: 7.5 mg/dL — ABNORMAL LOW (ref 8.9–10.3)
Chloride: 105 mmol/L (ref 98–111)
Chloride: 103 mmol/L (ref 98–111)
Creatinine, Ser: 1.02 mg/dL — ABNORMAL HIGH (ref 0.44–1.00)
Creatinine, Ser: 0.84 mg/dL (ref 0.44–1.00)
GFR calc Af Amer: >60 mL/min (ref >60)
GFR calc Af Amer: >60 mL/min (ref >60)
GFR calc non Af Amer: 53 mL/min — ABNORMAL LOW (ref >60)
GFR calc non Af Amer: >60 mL/min (ref >60)
Glucose, Bld: 140 mg/dL — ABNORMAL HIGH (ref 70–99)
Glucose, Bld: 140 mg/dL — ABNORMAL HIGH (ref 70–99)
Phosphorus: 1.3 mg/dL — ABNORMAL LOW (ref 2.5–4.6)
Phosphorus: 2.4 mg/dL — ABNORMAL LOW (ref 2.5–4.6)
Potassium: 4.1 mmol/L (ref 3.5–5.1)
Potassium: 3.9 mmol/L (ref 3.5–5.1)
Sodium: 139 mmol/L (ref 135–145)
Sodium: 137 mmol/L (ref 135–145)

## 2019-09-17 LAB — BLOOD GAS, ARTERIAL
Acid-Base Excess: 0.4 mmol/L (ref 0.0–2.0)
Bicarbonate: 22.6 mmol/L (ref 20.0–28.0)
Delivery systems: POSITIVE
Expiratory PAP: 5
FIO2: 1
Inspiratory PAP: 10
O2 Saturation: 99.5 %
Patient temperature: 37
pCO2 arterial: 29 mmHg — ABNORMAL LOW (ref 32.0–48.0)
pH, Arterial: 7.5 — ABNORMAL HIGH (ref 7.350–7.450)
pO2, Arterial: 159 mmHg — ABNORMAL HIGH (ref 83.0–108.0)

## 2019-09-17 LAB — PROCALCITONIN: Procalcitonin: 14.68 ng/mL

## 2019-09-17 LAB — MAGNESIUM: Magnesium: 2.1 mg/dL (ref 1.7–2.4)

## 2019-09-17 MED ORDER — SODIUM PHOSPHATES 45 MMOLE/15ML IV SOLN
30.0000 mmol | Freq: Once | INTRAVENOUS | Status: AC
  Administered 2019-09-17: 30 mmol via INTRAVENOUS
  Filled 2019-09-17: qty 10

## 2019-09-17 NOTE — Progress Notes (Signed)
Progress Note  Patient Name: Olivia [name] Date of Encounter: 09/17/2019  Primary Cardiologist: Nelva Bush, MD   Subjective   On CRRT Family at bedside Responding to pain, not following commands Fentanyl held, heparin discontinued  Inpatient Medications    Scheduled Meds: . Chlorhexidine Gluconate Cloth  6 each Topical Daily  . heparin injection (subcutaneous)  5,000 Units Subcutaneous Q8H  . mouth rinse  15 mL Mouth Rinse BID  . pantoprazole (PROTONIX) IV  40 mg Intravenous Q24H  . thiamine injection  100 mg Intravenous Q24H   Continuous Infusions: .  prismasol BGK 4/2.5 500 mL/hr at 09/17/19 1000  .  prismasol BGK 4/2.5 500 mL/hr at 09/17/19 1000  . sodium chloride 250 mL (09/12/19 0430)  . ceFEPime (MAXIPIME) IV Stopped (09/17/19 1033)  . prismasol BGK 4/2.5 2,000 mL/hr at 09/17/19 1432   PRN Meds: heparin, metoprolol tartrate, ondansetron **OR** ondansetron (ZOFRAN) IV   Vital Signs    Vitals:   09/17/19 1400 09/17/19 1500 09/17/19 1600 09/17/19 1604  BP: (!) 102/42 (!) 119/58 (!) 125/54   Pulse:  (!) 107 (!) 110 (!) 108  Resp: (!) 29 (!) 29 (!) 25 (!) 30  Temp: 98.1 F (36.7 C)     TempSrc: Axillary     SpO2: 98% 96% 93% 99%  Weight:      Height:        Intake/Output Summary (Last 24 hours) at 09/17/2019 1630 Last data filed at 09/17/2019 1600 Gross per 24 hour  Intake 583.79 ml  Output 1546 ml  Net -962.21 ml   Last 3 Weights 09/17/2019 09/16/2019 09/14/2019  Weight (lbs) 164 lb 3.9 oz 171 lb 1.2 oz 171 lb 4.8 oz  Weight (kg) 74.5 kg 77.6 kg 77.7 kg      Telemetry    Sinus tachycardia-no other significant arrhythmia personally Reviewed  ECG    No new EKG obtained- Personally Reviewed  Physical Exam   On examination : Sedated, unarousable no JVD, lungs scattered Rales, heart sounds tachycardic regular normal S1-S2 no murmurs appreciated, abdomen soft nontender no significant lower extremity edema.  Musculoskeletal exam unable to test  neurologic exam arousable   Labs    High Sensitivity Troponin:   Recent Labs  Lab 09/12/19 0703 09/12/19 1005 09/12/19 1733 09/13/19 0815 09/13/19 1806  TROPONINIHS 1,262* 1,255* 1,288* 542* 381*      Chemistry Recent Labs  Lab 09/10/2019 1841 09/15/2019 1841 09/12/19 0446 09/12/19 1440 09/14/19 0327 09/14/19 1644 09/16/19 0356 09/16/19 1613 09/17/19 0312  NA 139   < > 137   < > 137   < > 136 139 139  K 3.3*   < > 3.5   < > 4.5   < > 4.1 4.0 4.1  CL 102   < > 104   < > 103   < > 103 107 105  CO2 14*   < > 18*   < > 19*   < > 19* 21* 22  GLUCOSE 173*   < > 142*   < > 129*   < > 148* 157* 140*  BUN 51*   < > 57*   < > 17   < > 18 20 20   CREATININE 3.12*   < > 3.32*   < > 0.91   < > 0.76 0.86 1.02*  CALCIUM 8.0*   < > 7.2*   < > 7.6*   < > 7.5* 7.3* 7.5*  PROT 6.5  --  5.1*  --  5.7*  --   --   --   --   ALBUMIN 3.5   < > 2.7*   < > 2.5*   < > 2.3* 2.3* 2.3*  AST 78*  --  177*  --  232*  --   --   --   --   ALT 33  --  58*  --  168*  --   --   --   --   ALKPHOS 56  --  44  --  79  --   --   --   --   BILITOT 1.1  --  1.4*  --  1.4*  --   --   --   --   GFRNONAA 14*   < > 13*   < > >60   < > >60 >60 53*  GFRAA 16*   < > 15*   < > >60   < > >60 >60 >60  ANIONGAP 23*   < > 15   < > 15   < > 14 11 12    < > = values in this interval not displayed.     Hematology Recent Labs  Lab 09/14/19 0327 09/15/19 0451 09/16/19 0356  WBC 16.4* 16.9* 16.5*  RBC 3.43* 3.32* 3.57*  HGB 11.2* 10.9* 11.6*  HCT 32.7* 30.8* 33.5*  MCV 95.3 92.8 93.8  MCH 32.7 32.8 32.5  MCHC 34.3 35.4 34.6  RDW 13.5 13.7 13.6  PLT 174 107* 117*    BNP Recent Labs  Lab 09/19/2019 1841  BNP 379.0*     DDimer No results for input(s): DDIMER in the last 168 hours.   Radiology    No results found.  Cardiac Studies   2D echo 09/12/2019: 1. Challenging images. Definity used.  2. Left ventricular ejection fraction, by estimation, is 55 to 60%. The  left ventricle has grossly normal function.  Left ventricular endocardial  border not optimally defined to evaluate regional wall motion. There is  moderate left ventricular  hypertrophy. Left ventricular diastolic parameters are consistent with  Grade I diastolic dysfunction (impaired relaxation).  3. Right ventricular systolic function was not well visualized. The right  ventricular size is normal.  4. The mitral valve was not well visualized.  5. The aortic valve was not well visualized.  6. Tachycardia noted   Patient Profile     79 y.o. 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  Elevated troponin -Recent left heart cath with no obstructive disease Demand ischemia Heparin held, no further ischemic work-up needed  2.  Tachycardia Marked tachycardia noted on arrival to the hospital, Was started on esmolol infusion Felt to be sinus tachycardia not flutter Telemetry reviewed again, no further arrhythmia noted Beta-blockers can be used as needed  3. Renal dysfunction -on CRRT as per icu team  4.  Encephalopathy On arrival saturation 77% on room air Prior history of polypharmacy Seen by palliative care Family leaning towards comfort care   [facility] will sign off.   Medication Recommendations: No further changes at this time Other recommendations (labs, testing, etc): No further testing Follow up as an outpatient: As needed      Signed, Ida Rogue, MD  09/17/2019, 4:30 PM

## 2019-09-17 NOTE — Consult Note (Signed)
PHARMACY CONSULT NOTE - FOLLOW UP  Pharmacy Consult for Electrolyte Monitoring and Replacement   Recent Labs: Potassium (mmol/L)  Date Value  09/17/2019 4.1  08/29/2012 3.9   Magnesium (mg/dL)  Date Value  09/17/2019 2.1   Calcium (mg/dL)  Date Value  09/17/2019 7.5 (L)   Calcium, Total (mg/dL)  Date Value  08/29/2012 8.9   Albumin (g/dL)  Date Value  09/17/2019 2.3 (L)  08/29/2012 4.0   Phosphorus (mg/dL)  Date Value  09/17/2019 1.3 (L)   Sodium (mmol/L)  Date Value  09/17/2019 139  08/29/2012 142   Corrected Calcium:  8.8  Assessment: Pharmacy consulted for electrolyte monitoring and replacement for 79 yo female admitted with AKI. Patient initiated on CRRT 4/17.   Goal of Therapy:  Electrolytes WNL   Plan:  Repeat sodium phos 30 mmol IV again today. Discussed with Nephrology in setting of CRRT. Patient will likely continue to need additional replacement while she continues on CRRT.   Tawnya Crook, PharmD Clinical Pharmacist 09/17/2019 9:46 AM

## 2019-09-17 NOTE — Progress Notes (Signed)
   09/17/19 1300  Clinical Encounter Type  Visited With Other (Comment)  Visit Type Follow-up;Spiritual support  Consult/Referral To Chaplain  Spiritual Encounters  Spiritual Needs Emotional  Chaplain stop to visit family, daughter was contacting other family members. Chaplain will follow up later.

## 2019-09-17 NOTE — Consult Note (Addendum)
Consultation Note Date: 09/17/2019   Patient Name: Olivia [name]  DOB: [birth year]  MRN: [id number]  Age / Sex: 79 y.o., female  PCP: Danelle Berry, NP Referring Physician: Ottie Glazier, MD  Reason for Consultation: Establishing goals of care  HPI/Patient Profile: Olivia [name] is a 79 y.o. female with medical history significant for hypertension, hypothyroidism, bipolar affective disorder, CAD with unremarkable left heart cath July 2020, paroxysmal A. fib not on anticoagulation who was brought brought in by EMS for unresponsiveness being bagged on arrival.  EMS reported O2 sat of 77% on room air.  Patient reports a 1 week history of nausea vomiting and diarrhea after eating at a [employer].  Clinical Assessment and Goals of Care: Patient is resting in bed. She is on BIPAP and CRRT. Daughter Marcie Bal is at bedside. Daughter states her mother has always had mental health issues. She has always taken prescribed medications and OTC meds. She states she would take more medication than what was recommended or what she needed and gave an example of having pain, and taking 10 ASA pills at a time. She states last night she found a "codone" medication in a drawer in her home that was prescribed to someone else. She states her mother has always hoarded all kinds of pills. She states if her mother had something like a funeral to go to, she would take Imodium to guarantee she would not have to have a BM during the funeral, and then go home when it was over and take a laxitive to counter it. She shared family dynamics.  She states last Wednesday, her mother had went to a doctor's appt with a friend and went to eat after the appt. She states her mother became sick that evening. She states her QOL is her independence, ability to drive, and go get her hair fixed at her salon. She states her mother would never want to be  placed in a facility. She states her mother has "talked about going home to see her father" who is deceased.    We discussed her diagnoses, prognosis, GOC, EOL wishes disposition and options.  A detailed discussion was had today regarding advanced directives.  Concepts specific to code status, artifical feeding and hydration, IV antibiotics and rehospitalization were discussed.  The difference between an aggressive medical intervention path and a comfort care path was discussed.  Values and goals of care important to patient and family were attempted to be elicited.  Discussed limitations of medical interventions to prolong quality of life in some situations and discussed the concept of human mortality.  She states she will call her family to talk with them, and to choose the 4 visitors that will be present when she shifts to comfort care.        SUMMARY OF RECOMMENDATIONS    Daughter to speak with family about coming to bedside to transition to comfort care. Continue current care until they arrive. Would recommend ordering medication for pain, dyspnea, and anxiety prior to withdrawing care  and liberating her from BIPAP.   Prognosis:   Hours - Days with withdrawal of care. Anticipated hospital death.      Primary Diagnoses: Present on Admission: **None**   I have reviewed the medical record, interviewed the patient and family, and examined the patient. The following aspects are pertinent.  Past Medical History:  Diagnosis Date  . Agitation   . Allergic    Immunotherapy  . Allergic rhinitis   . ALLERGIC RHINITIS 10/26/2006   Qualifier: Diagnosis of  By: Council Mechanic MD, Hilaria Ota   . Anemia, iron deficiency   . Asthma   . Barrett's esophagus 01/04/2011  . Bipolar affective disorder ([place])   . BIPOLAR AFFECTIVE DISORDER, DEPRESSED, HX OF 10/25/2006   Qualifier: Diagnosis of  By: Zara Council LPN, Mariann Laster    . Blood in stool    Via stool cards  . Blood transfusion without reported diagnosis    . Breast cancer ([place]) 1973   Bilateral Mastectomies  . Carpal tunnel syndrome   . Cervical cancer [facility])    Hysterectomy  . Chest pain   . Chronic kidney disease   . 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 Collagen vascular disease ([place])   . Constipation    Mild  . Depressed   . Diarrhea   . DVT (deep venous thrombosis) ([place])   . Fibromyalgia   . Gastric ulcer    Acute  . Gastritis    Barretts, Stricture  . GERD (gastroesophageal reflux disease)   . Gross hematuria 06/23/2015  . History of kidney stones   . Hyperlipidemia   . Hypertension   . HYPERTENSION 10/26/2006   Qualifier: Diagnosis of  By: Council Mechanic MD, Hilaria Ota   . Hypothyroidism   . 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 Ophthalmoplegia    Dr. Nanine Means at [facility]  . Osteoarthritis    Dr. Marveen Reeks Dr. Jefm Bryant  . Osteoarthrosis    Dr. Marveen Reeks  . PAF (paroxysmal atrial fibrillation) ([place])   . Pain 01/2010   Declined by [facility] pain clinic  . Pneumonia    with trach / 2 YOA and 14 YOA  . PONV (postoperative nausea and vomiting)    past hx.-none recent  . Renal artery stenosis ([place]) 01/04/2011   S/p angioplasty of vessels x2   . Rheumatoid arthritis(714.0)    Dr. Jefm Bryant  . Sinusitis, acute 07/09/2012   Social History   Socioeconomic History  . Marital status: Widowed    Spouse name: Not on file  . Number of children: 2  . Years of education: Not on file  . Highest education level: Not on file  Occupational History  . Occupation: Medically Retired    Fish farm manager: retired  Tobacco Use  . Smoking status: Never Smoker  . Smokeless tobacco: Never Used  Substance and Sexual Activity  . Alcohol use: No    Comment: rarely mixed drink  . Drug use: No  . Sexual activity: Not Currently  Other Topics Concern  . Not on file  Social History Narrative   Widowed, 04/1995   Regular exercise: no   Social Determinants of Health   Financial Resource Strain:   . Difficulty of Paying  Living Expenses:   Food Insecurity:   . Worried About Charity fundraiser in the Last Year:   . Arboriculturist in the Last Year:   Transportation Needs:   . Film/video editor (Medical):   Marland Kitchen Lack of Transportation (Non-Medical):   Physical Activity:   .  Days of Exercise per Week:   . Minutes of Exercise per Session:   Stress:   . Feeling of Stress :   Social Connections:   . Frequency of Communication with Friends and Family:   . Frequency of Social Gatherings with Friends and Family:   . Attends Religious Services:   . Active Member of Clubs or Organizations:   . Attends Archivist Meetings:   Marland Kitchen Marital Status:    Family History  Problem Relation Age of Onset  . Dementia Father   . Other Father        Multiple Infarcts  . Coronary artery disease Father   . Stroke Father   . Heart disease Father        CAD  . Heart disease Mother        CAD  . Breast cancer Mother   . Coronary artery disease Mother   . Hypertension Mother   . Diabetes Mother   . Cancer Mother        Breast with met  . Heart murmur Brother   . Heart disease Brother        Heart murmur  . Diabetes Other   . Hyperlipidemia Other        Pacer, enlarged heart  . Cancer Other   . Other Other        Pacer, Enlarged heart, DM  . Kidney disease Neg Hx    Scheduled Meds: . Chlorhexidine Gluconate Cloth  6 each Topical Daily  . heparin injection (subcutaneous)  5,000 Units Subcutaneous Q8H  . mouth rinse  15 mL Mouth Rinse BID  . pantoprazole (PROTONIX) IV  40 mg Intravenous Q24H  . thiamine injection  100 mg Intravenous Q24H   Continuous Infusions: .  prismasol BGK 4/2.5 500 mL/hr at 09/17/19 1000  .  prismasol BGK 4/2.5 500 mL/hr at 09/17/19 1000  . sodium chloride 250 mL (09/12/19 0430)  . ceFEPime (MAXIPIME) IV Stopped (09/17/19 1033)  . prismasol BGK 4/2.5 2,000 mL/hr at 09/17/19 1432   PRN Meds:.heparin, metoprolol tartrate, ondansetron **OR** ondansetron (ZOFRAN)  IV Medications Prior to Admission:  Prior to Admission medications   Medication Sig Start Date End Date Taking? Authorizing Provider  acetaminophen (TYLENOL) 325 MG tablet Take 650 mg by mouth every 6 (six) hours as needed for mild pain or fever.    Yes [provider]  aspirin 81 MG tablet Take 81 mg by mouth daily.   Yes [provider]  baclofen (LIORESAL) 10 MG tablet Take 5 mg by mouth 2 (two) times daily.  07/03/18  Yes [provider]  Calcium Carbonate-Vitamin D (CALTRATE 600+D PO) Take 1 tablet by mouth 2 (two) times a day.    Yes [provider]  Cholecalciferol (D 5000) 125 MCG (5000 UT) capsule Take 5,000 Units by mouth daily.    Yes [provider]  DULoxetine (CYMBALTA) 60 MG capsule Take 60 mg by mouth every morning.    Yes [provider]  fenofibrate (TRICOR) 145 MG tablet Take 145 mg by mouth daily.   Yes [provider]  gabapentin (NEURONTIN) 300 MG capsule Take 300 mg by mouth 3 (three) times daily.  07/03/18  Yes [provider]  lamoTRIgine (LAMICTAL) 25 MG tablet Take 50 mg by mouth at bedtime.    Yes [provider]  levothyroxine (SYNTHROID) 25 MCG tablet Take 25 mcg by mouth daily before breakfast.   Yes [provider]  losartan (COZAAR) 25 MG  tablet Take 25 mg by mouth daily.    Yes [provider]  metoprolol tartrate (LOPRESSOR) 50 MG tablet Take 50 mg by mouth 2 (two) times daily.   Yes [provider]  Multiple Vitamin (MULTIVITAMIN) tablet Take 1 tablet by mouth daily.     Yes [provider]  pantoprazole (PROTONIX) 40 MG tablet Take 40 mg by mouth 2 (two) times daily.    Yes [provider]  primidone (MYSOLINE) 50 MG tablet Take 50 mg by mouth 2 (two) times a day.    Yes [provider]  propranolol (INDERAL) 10 MG tablet Take 10 mg by mouth 2 (two) times daily.   Yes [provider]  vitamin C (ASCORBIC ACID) 250 MG  tablet Take 1,000 mg by mouth in the morning and at bedtime.    Yes [provider]   Allergies  Allergen Reactions  . Neomycin Anaphylaxis    blisters  . Lithium   . Meperidine Hcl     REACTION: UNSPECIFIED  . Pravastatin Sodium     Weakness, resolved after stopping medicine 2013  . Systane [Polyethyl Glycol-Propyl Glycol] Rash   Review of Systems  Unable to perform ROS   Physical Exam Constitutional:      Comments: Reaches for daughter's hand. Opens eyes slightly and closes them.   Skin:    Comments: Toes with mottling.      Vital Signs: BP (!) 102/42   Pulse (!) 105   Temp 98.1 F (36.7 C) (Axillary)   Resp (!) 29   Ht 5' 2"  (1.575 m)   Wt 74.5 kg   SpO2 98%   BMI 30.04 kg/m  Pain Scale: Faces   Pain Score: Asleep   SpO2: SpO2: 98 % O2 Device:SpO2: 98 % O2 Flow Rate: .O2 Flow Rate (L/min): 45 L/min  IO: Intake/output summary:   Intake/Output Summary (Last 24 hours) at 09/17/2019 1432 Last data filed at 09/17/2019 1400 Gross per 24 hour  Intake 588.18 ml  Output 1715 ml  Net -1126.82 ml    LBM: Last BM Date: 09/13/2019 Baseline Weight: Weight: 81.1 kg Most recent weight: Weight: 74.5 kg     Palliative Assessment/Data:     Time In: 11:00 Time Out: 12:30 Time Total: 1 hour 30 min Greater than 50%  of this time was spent counseling and coordinating care related to the above assessment and plan.  Signed by: Asencion Gowda, NP   Please contact Palliative Medicine Team phone at [phone] for questions and concerns.  For individual provider: See Shea Evans

## 2019-09-17 NOTE — Progress Notes (Signed)
Name: Olivia [name] MRN: [id number] DOB: 1941-05-20    ADMISSION DATE:  08/30/2019 CONSULTATION DATE:  09/12/2019  REFERRING MD :  Rufina Falco, NP  CHIEF COMPLAINT:  Unresponsiveness  BRIEF PATIENT DESCRIPTION:  79 y.o. Female, DNI, admitted 09/12/19 with Acute Hypoxic Respiratory Failure secondary to ? PE, severe metabolic acidosis, SVT vs. Atrial flutter w/ RVR, elevated troponin (NSTEMI vs. Demand ischemia), Cardiogenic shock (due to rapid rate) & septic shock (in setting of acute pancreatitis & Gastroenteritis), and AKI.  She has been placed on Bicarb gtt, Heparin & Amiodarone gtts.  Cardiology & Nephrology are consulted.  EVENTS  4/17: Admission to [facility] 4/17: DNR in event of cardiopulm arrest but otherwise treat 4/17: Critically ill ~ SVT vs. A.flutter w/ RVR, Resp Failure, shock, AKI.  PCCM, Cardiology, & Nephrology consulted  4/17: Adenosine 6 mg IV x1 dose without noted improvement in HR; Cardizem d/c and placed on Amiodarone gtt 4/17: Persistent RVR despite Amio, trial of beta blocker with good response, esmolol gtt, amio d/c'd 4/19 - Family meeting with both daughters - patient had apperntly been taking multiple prescription and OTC meds as well as random natural remedies that she had ordered from television, family believes this is the cause of her current clinical demise.  4/20- reviewed testing with family. CTPE with no VTE , will stop heparin drip.  CTA abdomen and pelvis without mesenteric ischemia but did find significant swelling of GI tract and pancreas. 4/21 - patient remains encephalopathic on CRRT.  She has been weaned off of vasopressors 4/22 - patient was sad and crying during exam today, I met with family and we reviewed imaging to discuss options again. They wish to proceed with bronchoscopy.    STUDIES:  4/16: CXR>> Cardiac shadows within normal limits. Lungs are well aerated bilaterally. No focal infiltrate or sizable effusion is seen. No bony  abnormality is noted. 4/16: CT Head w/o contrast>>No acute intracranial process. 4/16: CT Cervical Spine>> Extensive multilevel cervical degenerative changes. No acute fracture. 4/16: CT Abdomen & Pelvis>>1. Fluid within the distal esophagus, throughout prominent smallbowel, cecum and ascending colon with associated mesenteric edema. Overall findings consistent with generalized enteritis. Wall thickening of the duodenum proximal jejunum. 2. Mild peripancreatic fat stranding and edema most prominent about the head and uncinate process, can be seen with acute pancreatitis in the appropriate clinical setting. Recommend correlation with pancreatic enzymes. 3. Small volume abdominopelvic ascites. Generalized mesenteric and intra-abdominal edema. Trace pleural effusions/pleural thickening. 4. 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 5. Stable 2.1 cm left ovarian cyst dating back to 2017, likely benign. 6. Additional stable chronic findings as described.  4/17: Venous US Bilateral LE>> No DVT  CULTURES: SARS-CoV-2 PCR 4/16>> negative Influenza PCR 4/16>> negative Blood culture x2 4/16>> MSSA x1 only ? contam. Urine 4/16>>negative MRSA PCR 4/17>> negative GI panel PCR 4/16>> Blood cultures x2 4/18>>  ANTIBIOTICS: Cefepime 4/16>> 4/17 Flagyl 4/16>> 4/17 Vancomycin x1 dose in ED 4/16 Zosyn 4/17>> 4/17 Meropenem >> 4/17 Vanco >> 4/17   .  prismasol BGK 4/2.5 500 mL/hr at 09/16/19 1336  .  prismasol BGK 4/2.5 500 mL/hr at 09/16/19 1358  . sodium chloride 250 mL (09/12/19 0430)  . ceFEPime (MAXIPIME) IV 2 g (09/17/19 1003)  . prismasol BGK 4/2.5 2,000 mL/hr at 09/16/19 1613  . sodium phosphate  Dextrose 5% IVPB 43 mL/hr at 09/17/19 1003      Allergies  Allergen Reactions  . Neomycin Anaphylaxis    blisters  . Lithium   .  Meperidine Hcl     REACTION: UNSPECIFIED  . Pravastatin Sodium     Weakness, resolved after stopping medicine 2013  . Systane [Polyethyl Glycol-Propyl  Glycol] Rash   .  REVIEW OF SYSTEMS: Unable to obtain due to patient's encephalopathy  SUBJECTIVE:  Patient is very confused, pulling at lines test to have mittens to prevent pulling of lines particularly dialysis catheter. Occasionally more lucid.  States abdominal pain better.  No other complaints reported.  Fidgety in bed.   VITAL SIGNS: Temp:  [98.6 F (37 C)-100.3 F (37.9 C)] 98.6 F (37 C) (04/22 0749) Pulse Rate:  [29-111] 105 (04/22 0400) Resp:  [24-42] 30 (04/22 1000) BP: (89-144)/(30-85) 127/53 (04/22 1000) SpO2:  [77 %-100 %] 95 % (04/22 1000) FiO2 (%):  [60 %-100 %] 100 % (04/22 1000) Weight:  [74.5 kg] 74.5 kg (04/22 0310)  PHYSICAL EXAMINATION: General:  Critically ill appearing female, laying in bed, on HFNC, with mild respiratory distress Neuro:  GCS10 with encephalopathy HEENT:  Atraumatic, normocephalic, neck supple, no JVD, left IJ Trialysis catheter Cardiovascular:  Tachycardia, regular rhythm, 1+ pulses, cool extremities though less dusky, no edema Lungs:  Clear to auscultation bilaterally, no wheezing or rales, tachycpnea, no overt accessory muscle use Abdomen:  Soft, non-tender to palpitation, mildly distended, no guarding or rebound tenderness Musculoskeletal:  Generalized weakness, no deformities, no edema Skin:  Cool and dry.  No obvious rashes, lesions, or ulcerations  Recent Labs  Lab 09/16/19 0356 09/16/19 1613 09/17/19 0312  NA 136 139 139  K 4.1 4.0 4.1  CL 103 107 105  CO2 19* 21* 22  BUN _0 CREATININE 0.76 0.86 1.02*  GLUCOSE 148* 157* 140*   Recent Labs  Lab 09/14/19 0327 09/15/19 0451 09/16/19 0356  HGB 11.2* 10.9* 11.6*  HCT 32.7* 30.8* 33.5*  WBC 16.4* 16.9* 16.5*  PLT 174 107* 117*   CT ANGIO CHEST PE W OR WO CONTRAST  Result Date: 09/15/2019 CLINICAL DATA:  78 year old female with concern for pulmonary embolism. EXAM: CT ANGIOGRAPHY CHEST WITH CONTRAST TECHNIQUE: Multidetector CT imaging of the chest was performed  using the standard protocol during bolus administration of intravenous contrast. Multiplanar CT image reconstructions and MIPs were obtained to evaluate the vascular anatomy. CONTRAST:  122m OMNIPAQUE IOHEXOL 350 MG/ML SOLN COMPARISON:  Chest CT dated 12/03/2018. FINDINGS: Evaluation is limited due to streak artifact caused by patient's arms. Cardiovascular: There is mild to moderate cardiomegaly. No pericardial effusion. The thoracic aorta is unremarkable. The origins of the great vessels of the aortic arch appear patent as visualized. Evaluation of the pulmonary arteries is very limited due to respiratory motion artifact and suboptimal opacification and visualization of the peripheral branches. No obvious large central pulmonary artery embolus identified. Mediastinum/Nodes: No hilar or mediastinal adenopathy noted. Evaluation however is very limited due to consolidative changes of the adjacent lungs. The esophagus is grossly unremarkable. There is a subcentimeter hypodense focus in the left thyroid lobe which may be artifactual or represent a nodule. No mediastinal fluid collection. Left IJ central venous line with tip close to the cavoatrial junction. Lungs/Pleura: There are moderate bilateral pleural effusions with complete consolidative changes and collapse of the lower lobes bilaterally with air bronchogram. This finding is new since the prior CT. There is partial consolidation of the lingula with air bronchograms as well as near complete consolidative changes of the right middle lobe. There is no pneumothorax. The central airways remain patent. Upper Abdomen: See the report for the abdomen pelvis.  Musculoskeletal: Degenerative changes of the spine. No acute osseous pathology. Review of the MIP images confirms the above findings. IMPRESSION: 1. No definite CT evidence of central pulmonary artery embolus. Evaluation of the peripheral branches is very limited due to respiratory motion artifact and suboptimal  opacification of the peripheral branches. 2. Moderate bilateral pleural effusions with complete consolidative changes and collapse of the lower lobes bilaterally and near complete consolidative changes of the right middle lobe. Findings may represent a combination of atelectasis and pneumonia. Clinical correlation and follow-up to resolution recommended. 3. Mild to moderate cardiomegaly. Electronically Signed   By: Anner Crete M.D.   On: 09/15/2019 16:48   CT Angio Abd/Pel w/ and/or w/o  Result Date: 09/15/2019 CLINICAL DATA:  79 year old with acute hypoxic respiratory failure. Evaluate for mesenteric ischemia. EXAM: CT ANGIOGRAPHY ABDOMEN AND PELVIS WITH CONTRAST AND WITHOUT CONTRAST TECHNIQUE: Multidetector CT imaging of the abdomen and pelvis was performed using the standard protocol during bolus administration of intravenous contrast. Multiplanar reconstructed images and MIPs were obtained and reviewed to evaluate the vascular anatomy. CONTRAST:  126m OMNIPAQUE IOHEXOL 350 MG/ML SOLN COMPARISON:  CT abdomen and pelvis 09/22/2019 and CT abdomen/pelvis 06/30/2018 FINDINGS: VASCULAR Aorta: Small amount of atherosclerotic plaque in the infrarenal abdominal aorta without aneurysm. Celiac: There is not a true celiac trunk. Splenic artery originates directly from the aorta. Common hepatic artery originates directly from the abdominal aorta. Again noted are metallic densities near the gastric cardia and region of the left gastric artery. SMA: SMA is widely patent without significant plaque, stenosis or aneurysm. Renals: Bilateral renal arteries are patent without significant stenosis. 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 IMA: Proximal IMA is patent. Limited evaluation of the IMA beyond the origin. Inflow: Patent without evidence of aneurysm, dissection, vasculitis or significant  stenosis. Proximal Outflow: Proximal femoral arteries are patent bilaterally. Stranding and evidence for a small hematoma in the left groin anterior to the left femoral vessels. No clear evidence for pseudoaneurysm. Veins: Hepatic veins and portal veins are patent. Main mesenteric veins are patent. Splenic vein is patent. IVC and renal veins are patent. Iliac and proximal femoral veins appear to be patent. Central venous catheter in the right common femoral vein that terminates in the distal right common iliac vein. Review of the MIP images confirms the above findings. NON-VASCULAR Lower chest: Bilateral pleural effusions with extensive volume loss and consolidation in the lower lobes. Heart is enlarged for size. Hepatobiliary: Perihepatic ascites. Perihepatic ascites has slightly decreased from the recent comparison examination. Cholecystectomy. Low-density in the left hepatic lobe probably represents focal fat near the falciform ligament. Pancreas: Large amount of fluid and edema surrounding the pancreas. Cannot exclude small amount of necrosis in the pancreatic neck region. Spleen: Spleen is normal for size. Slightly decreased attenuation along the inferior aspect of spleen may be artifactual. Adrenals/Urinary Tract: Adrenal glands are within normal limits. There is a small fat attenuating lesion along the posterior right kidney that measures 0.9 cm. This structure has not significantly changed since 2020 and suggestive for a small angiomyolipoma. Catheter within the urinary bladder. Small amount of gas in the urinary bladder is likely iatrogenic. Stomach/Bowel: Normal appearance of stomach and duodenum without distension. There is diffuse distension of small bowel containing fluid. Degree of small bowel distension has slightly increased since 08/29/2019. Cannot exclude mild wall thickening involving the rectum, sigmoid colon and descending colon. Some  of the small bowel loops in the anterior lower abdomen are  poorly defined but there is no clear evidence for extraluminal gas or abscess formation. Lymphatic: No significant lymph node enlargement. Reproductive: Status post hysterectomy. Low-density left ovarian or adnexal structure measures 2.3 x 1.2 cm and this measured 2.6 x 1.6 cm in 2020. This is most compatible with a benign finding. Other: Again noted is free fluid in the pelvis. Slightly increased amount of pelvic fluid without gas. Edema and a small amount of fluid in the right paracolic gutter. Fluid between the right kidney and the liver. Small amount of ascites in the left paracolic gutter and inferior aspect of the spleen. Again noted are surgical clips scattered throughout the anterior abdominal walls. Increased subcutaneous edema from the recent comparison examination. Musculoskeletal: No acute bone abnormality. IMPRESSION: VASCULAR 1. Main visceral arteries are patent without significant stenosis. 2. Atherosclerotic disease in the abdominal aorta without aneurysm or dissection. Aortic Atherosclerosis (ICD10-I70.0). 3. Main venous structures are patent. 4. 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 5. Focal edema and probably a small hematoma just anterior to left femoral vessels. Question recent catheterization in this area. No clear evidence for a pseudoaneurysm. NON-VASCULAR 1. Extensive edema and fluid centered around the pancreas. Findings are concerning for pancreatitis and there may be a small amount of pancreatic necrosis in the region of the pancreatic neck. No evidence for pseudocyst formation. 2. Dilated loops of small bowel throughout the abdomen and pelvis containing fluid. The small bowel distension has progressed since 09/25/2019. Findings are nonspecific but could be related to an adynamic ileus. There is enhancement throughout the small bowel walls without significant small bowel wall thickening. 3. Possible wall thickening involving the descending colon and sigmoid  colon. These areas are difficult to evaluate due to incomplete distension. Mild colitis cannot be excluded. 4. Persistent abdominopelvic ascites. 5. Extensive volume loss and consolidation at both lung bases with bilateral pleural effusions. 6. Stable fat attenuating lesion in the right kidney that measures up to 0.9 cm. This is suggestive for a small angiomyolipoma. Electronically Signed   By: Markus Daft M.D.   On: 09/15/2019 17:22    ASSESSMENT / PLAN:  Acute Hypoxic Respiratory Failure     -due to pulmonary edema and compressive atelectasis from bilateral pleural effusions   - s/p CT chest -Supplemental O2 as needed to maintain O2 sats greater than 92% -High flow O2 as needed -changed IV abs to cefepime s/p merem previously -Heparin gtt is d/cd - no PE on CT chest -Lower extremity Dopplers negative for DVT  Circulatory shock           - present on admission          - at this point seems to be distributive due to GI losses -Initially with moderate pancreatitis, Lipase is normalized and there is small area of necrosis on pancrease per CT abd.  - Cannot rule out sepsis - procalicotin is profoundly elevated in context of AKI  - potentially CNS infection since patient does have concurrent encephalopathy - changed abs to cefepime - Patient had taken multiple OTC remedies and per family likely had overdose on unknown substance.    Encephalopathy - toxic metabolic vs septic     - procal >70 improved      -continue CRRT per nephro-appreciate input   - continue cefepime   AKI Anion Gap Metabolic Acidosis -Monitor I&O's / urinary output -Follow BMP -Ensure adequate renal perfusion -Avoid nephrotoxic  agents as able -Replace electrolytes as indicated -IV Fluids -appreciate input from nephrology -On CRRT   BEST PRACTICES: DISPOSITION: ICU GOALS OF CARE: DNR in the event of cardiopulmonary arrest, aggressive therapy otherwise VTE PROPHYLAXIS/ANTICOAGULATION: Heparin q8h  Had famly  meeting today with both sisters at bedside, have offered CM and TCC services and they are appreciative.     Critical care provider statement:    Critical care time (minutes):  34   Critical care time was exclusive of:  Separately billable procedures and  treating other patients   Critical care was necessary to treat or prevent imminent or  life-threatening deterioration of the following conditions:  acute hypoxemic respiratory failure, AKI, multiple comorbid conditions   Critical care was time spent personally by me on the following  activities:  Development of treatment plan with patient or surrogate,  discussions with consultants, evaluation of patient's response to  treatment, examination of patient, obtaining history from patient or  surrogate, ordering and performing treatments and interventions, ordering  and review of laboratory studies and re-evaluation of patient's condition   I assumed direction of critical care for this patient from another  provider in my specialty: no    09/17/2019, 10:25 AM   *This note was dictated using voice recognition software/Dragon.  Despite best efforts to proofread, errors can occur which can change the meaning.  Any change was purely unintentional.    Ottie Glazier, M.D.  [facility] [place]

## 2019-09-17 NOTE — Progress Notes (Signed)
[facility]  ROUNDING NOTE   Subjective:  Patient unfortunately remains critically ill. Was on BiPAP this a.m. We instructed nursing to discontinue ultrafiltration for now. Lower blood pressures noted this a.m. Patient noted to be quite tachypneic with pH of 7.5 PCO2 of 29.  Objective:  Vital signs in last 24 hours:  Temp:  [98.6 F (37 C)-100.3 F (37.9 C)] 98.6 F (37 C) (04/22 0749) Pulse Rate:  [29-111] 104 (04/22 1200) Resp:  [23-42] 23 (04/22 1200) BP: (89-139)/(30-85) 118/52 (04/22 1200) SpO2:  [77 %-100 %] 92 % (04/22 1200) FiO2 (%):  [60 %-100 %] 100 % (04/22 1200) Weight:  [74.5 kg] 74.5 kg (04/22 0310)  Weight change: -3.1 kg Filed Weights   09/14/19 0318 09/16/19 0421 09/17/19 0310  Weight: 77.7 kg 77.6 kg 74.5 kg    Intake/Output: I/O last 3 completed shifts: In: 856.5 [I.V.:211.4; IV Piggyback:645.2] Out: 2905 [Urine:325; Other:2580]   Intake/Output this shift:  Total I/O In: 352.8 [IV Piggyback:352.8] Out: 416 [Other:416]  Physical Exam: General: Critically ill-appearing  Head: Normocephalic, atraumatic. Moist oral mucosal membranes  Eyes: Anicteric  Neck: Supple, trachea midline  Lungs:  Scattered rhonchi, on BiPAP  Heart: S1S2, tachycardic  Abdomen:  Distention noted  Extremities: Trace peripheral edema.  Neurologic: Lethargic   Skin: No lesions  Access: Left IJ temporary dialysis catheter    Basic Metabolic Panel: Recent Labs  Lab 09/13/19 0319 09/13/19 1611 09/14/19 0327 09/14/19 1644 09/15/19 0451 09/15/19 0451 09/15/19 1730 09/15/19 1730 09/16/19 0356 09/16/19 1613 09/17/19 0312  NA 138   < > 137   < > 140  --  137  --  136 139 139  K 4.0   < > 4.5   < > 4.3  --  3.9  --  4.1 4.0 4.1  CL 104   < > 103   < > 104  --  104  --  103 107 105  CO2 20*   < > 19*   < > 23  --  18*  --  19* 21* 22  GLUCOSE 118*   < > 129*   < > 133*  --  150*  --  148* 157* 140*  BUN 36*   < > 17   < > 14  --  17  --  18 20 20    CREATININE 1.82*   < > 0.91   < > 0.75  --  0.86  --  0.76 0.86 1.02*  CALCIUM 6.7*   < > 7.6*   < > 7.7*   < > 7.7*   < > 7.5* 7.3* 7.5*  MG 1.9  --  2.6*  --  2.6*  --   --   --  2.4  --  2.1  PHOS 3.4   < > 2.5   < > 1.5*  --  1.7*  --  1.2* 2.4* 1.3*   < > = values in this interval not displayed.    Liver Function Tests: Recent Labs  Lab 09/14/2019 1841 09/03/2019 1841 09/12/19 0446 09/12/19 1733 09/14/19 0327 09/14/19 1644 09/15/19 0451 09/15/19 1730 09/16/19 0356 09/16/19 1613 09/17/19 0312  AST 78*  --  177*  --  232*  --   --   --   --   --   --   ALT 33  --  58*  --  168*  --   --   --   --   --   --  ALKPHOS 56  --  44  --  79  --   --   --   --   --   --   BILITOT 1.1  --  1.4*  --  1.4*  --   --   --   --   --   --   PROT 6.5  --  5.1*  --  5.7*  --   --   --   --   --   --   ALBUMIN 3.5   < > 2.7*   < > 2.5*   < > 2.5* 2.4* 2.3* 2.3* 2.3*   < > = values in this interval not displayed.   Recent Labs  Lab 09/09/2019 2051 09/12/19 0446 09/12/19 0703 09/13/19 0319 09/15/19 1730  LIPASE 517*  --  200* 49 15  AMYLASE  --  320*  --  98  --    No results for input(s): AMMONIA in the last 168 hours.  CBC: Recent Labs  Lab 09/21/2019 1841 08/28/2019 1841 09/12/19 0446 09/13/19 0319 09/14/19 0327 09/15/19 0451 09/16/19 0356  WBC 15.1*   < > 9.5 11.5* 16.4* 16.9* 16.5*  NEUTROABS 12.7*  --   --   --   --   --   --   HGB 16.3*   < > 14.2 12.3 11.2* 10.9* 11.6*  HCT 48.0*   < > 42.3 35.5* 32.7* 30.8* 33.5*  MCV 96.6   < > 98.4 94.2 95.3 92.8 93.8  PLT 298   < > 234 234 174 107* 117*   < > = values in this interval not displayed.    Cardiac Enzymes: No results for input(s): CKTOTAL, CKMB, CKMBINDEX, TROPONINI in the last 168 hours.  BNP: Invalid input(s): POCBNP  CBG: Recent Labs  Lab 09/12/19 0102 09/12/19 1944  GLUCAP 129* 114*    Microbiology: Results for orders placed or performed during the hospital encounter of 09/23/2019  Blood culture (routine  x 2)     Status: Abnormal   Collection Time: 09/13/2019  6:41 PM   Specimen: BLOOD  Result Value Ref Range Status   Specimen Description   Final    BLOOD RIGHT ARM Performed at [facility], [address], [place], [place] [address]    Special Requests   Final    BOTTLES DRAWN AEROBIC AND ANAEROBIC Blood Culture results may not be optimal due to an inadequate volume of blood received in culture bottles Performed at [facility], [address] [place], [place] [address]    Culture  Setup Time   Final    AEROBIC BOTTLE ONLY GRAM POSITIVE COCCI CRITICAL RESULT CALLED TO, READ BACK BY AND VERIFIED WITH: SCOTT HALL ON 09/12/19 AT 2210 [facility]    Culture (A)  Final    STAPHYLOCOCCUS SPECIES (COAGULASE NEGATIVE) THE SIGNIFICANCE OF ISOLATING THIS ORGANISM FROM A SINGLE SET OF BLOOD CULTURES WHEN MULTIPLE SETS ARE DRAWN IS UNCERTAIN. PLEASE NOTIFY THE MICROBIOLOGY DEPARTMENT WITHIN ONE WEEK IF SPECIATION AND SENSITIVITIES ARE REQUIRED. Performed at [facility], [place] [address] [place], [place] [address]    Report Status 09/14/2019 FINAL  Final  Blood Culture ID Panel (Reflexed)     Status: Abnormal   Collection Time: 09/05/2019  6:41 PM  Result Value Ref Range Status   Enterococcus species NOT DETECTED NOT DETECTED Final   Listeria monocytogenes NOT DETECTED NOT DETECTED Final   Staphylococcus species DETECTED (A) NOT DETECTED Final    Comment: Methicillin (  oxacillin) susceptible coagulase negative staphylococcus. Possible blood culture contaminant (unless isolated from more than one blood culture draw or clinical case suggests pathogenicity). No antibiotic treatment is indicated for blood  culture contaminants. CRITICAL RESULT CALLED TO, READ BACK BY AND VERIFIED WITH: SCOTT HALL ON 09/12/19 AT 2210 [place]    Staphylococcus aureus (BCID) NOT DETECTED NOT DETECTED Final   Methicillin resistance NOT DETECTED NOT DETECTED Final   Streptococcus species NOT DETECTED NOT  DETECTED Final   Streptococcus agalactiae NOT DETECTED NOT DETECTED Final   Streptococcus pneumoniae NOT DETECTED NOT DETECTED Final   Streptococcus pyogenes NOT DETECTED NOT DETECTED Final   Acinetobacter baumannii NOT DETECTED NOT DETECTED Final   Enterobacteriaceae species NOT DETECTED NOT DETECTED Final   Enterobacter cloacae complex NOT DETECTED NOT DETECTED Final   Escherichia coli NOT DETECTED NOT DETECTED Final   Klebsiella oxytoca NOT DETECTED NOT DETECTED Final   Klebsiella pneumoniae NOT DETECTED NOT DETECTED Final   Proteus species NOT DETECTED NOT DETECTED Final   Serratia marcescens NOT DETECTED NOT DETECTED Final   Haemophilus influenzae NOT DETECTED NOT DETECTED Final   Neisseria meningitidis NOT DETECTED NOT DETECTED Final   Pseudomonas aeruginosa NOT DETECTED NOT DETECTED Final   Candida albicans NOT DETECTED NOT DETECTED Final   Candida glabrata NOT DETECTED NOT DETECTED Final   Candida krusei NOT DETECTED NOT DETECTED Final   Candida parapsilosis NOT DETECTED NOT DETECTED Final   Candida tropicalis NOT DETECTED NOT DETECTED Final    Comment: Performed at [facility], [address] [place], [place] [address]  Respiratory Panel by RT PCR (Flu A&B, Covid) - Nasopharyngeal Swab     Status: None   Collection Time: 09/01/2019  7:14 PM   Specimen: Nasopharyngeal Swab  Result Value Ref Range Status   SARS Coronavirus 2 by RT PCR NEGATIVE NEGATIVE Final    Comment: (NOTE) SARS-CoV-2 target nucleic acids are NOT DETECTED. The SARS-CoV-2 RNA is generally detectable in upper respiratoy specimens during the acute phase of infection. The lowest concentration of SARS-CoV-2 viral copies this assay can detect is 131 copies/mL. A negative result does not preclude SARS-Cov-2 infection and should not be used as the sole basis for treatment or other patient management decisions. A negative result may occur with  improper specimen collection/handling, submission of specimen  other than nasopharyngeal swab, presence of viral mutation(s) within the areas targeted by this assay, and inadequate number of viral copies (<131 copies/mL). A negative result must be combined with clinical observations, patient history, and epidemiological information. The expected result is Negative. Fact Sheet for Patients:  PinkCheek.be Fact Sheet for Healthcare Providers:  GravelBags.it This test is not yet ap proved or cleared by the [place] FDA and  has been authorized for detection and/or diagnosis of SARS-CoV-2 by FDA under an Emergency Use Authorization (EUA). This EUA will remain  in effect (meaning this test can be used) for the duration of the COVID-19 declaration under Section 564(b)(1) of the Act, 21 U.S.C. section 360bbb-3(b)(1), unless the authorization is terminated or revoked sooner.    Influenza A by PCR NEGATIVE NEGATIVE Final   Influenza B by PCR NEGATIVE NEGATIVE Final    Comment: (NOTE) The Xpert Xpress SARS-CoV-2/FLU/RSV assay is intended as an aid in  the diagnosis of influenza from Nasopharyngeal swab specimens and  should not be used as a sole basis for treatment. Nasal washings and  aspirates are unacceptable for Xpert Xpress SARS-CoV-2/FLU/RSV  testing. Fact Sheet for Patients: PinkCheek.be Fact Sheet for Healthcare  Providers: GravelBags.it This test is not yet approved or cleared by the [place] and  has been authorized for detection and/or diagnosis of SARS-CoV-2 by  FDA under an Emergency Use Authorization (EUA). This EUA will remain  in effect (meaning this test can be used) for the duration of the  Covid-19 declaration under Section 564(b)(1) of the Act, 21  U.S.C. section 360bbb-3(b)(1), unless the authorization is  terminated or revoked. Performed at [facility], [address] [place], [place]  [address]   Blood culture (routine x 2)     Status: None   Collection Time: 09/24/2019  7:35 PM   Specimen: BLOOD  Result Value Ref Range Status   Specimen Description BLOOD RIGHT ANTECUBITAL  Final   Special Requests   Final    BOTTLES DRAWN AEROBIC AND ANAEROBIC Blood Culture results may not be optimal due to an inadequate volume of blood received in culture bottles   Culture   Final    NO GROWTH 5 DAYS Performed at [facility], [address] [place], [place] [address]    Report Status 09/16/2019 FINAL  Final  Urine culture     Status: None   Collection Time: 09/10/2019 11:51 PM   Specimen: Urine, Random  Result Value Ref Range Status   Specimen Description   Final    URINE, RANDOM Performed at [facility], [address], [place], [place] [address]    Special Requests   Final    NONE Performed at [facility], [address], [place], [place] [address]    Culture   Final    NO GROWTH Performed at [facility], [place] [address], [place], [place] [address]    Report Status 09/13/2019 FINAL  Final  MRSA PCR Screening     Status: None   Collection Time: 09/12/19  1:07 AM   Specimen: Nasopharyngeal  Result Value Ref Range Status   MRSA by PCR NEGATIVE NEGATIVE Final    Comment:        The GeneXpert MRSA Assay (FDA approved for NASAL specimens only), is one component of a comprehensive MRSA colonization surveillance program. It is not intended to diagnose MRSA infection nor to guide or monitor treatment for MRSA infections. Performed at [facility], [address] [place], [place] [address]   CULTURE, BLOOD (ROUTINE X 2) w Reflex to ID Panel     Status: None (Preliminary result)   Collection Time: 09/13/19  8:37 PM   Specimen: BLOOD LEFT HAND  Result Value Ref Range Status   Specimen Description BLOOD LEFT HAND  Final   Special Requests   Final    BOTTLES DRAWN AEROBIC AND ANAEROBIC Blood Culture adequate volume   Culture    Final    NO GROWTH 4 DAYS Performed at [facility], [address] [place],  [address]    Report Status PENDING  Incomplete  CULTURE, BLOOD (ROUTINE X 2) w Reflex to ID Panel     Status: None (Preliminary result)   Collection Time: 09/13/19  8:52 PM   Specimen: BLOOD RIGHT HAND  Result Value Ref Range Status   Specimen Description BLOOD RIGHT HAND  Final   Special Requests   Final    BOTTLES DRAWN AEROBIC AND ANAEROBIC Blood Culture results may not be optimal due to an inadequate volume of blood received in culture bottles   Culture   Final    NO GROWTH 4 DAYS Performed at [facility], [place], [place] [address]    Report Status PENDING  Incomplete    Coagulation Studies: No results for input(s): LABPROT, INR in the last 72 hours.  Urinalysis: No results for input(s): COLORURINE, LABSPEC, PHURINE, GLUCOSEU, HGBUR, BILIRUBINUR, KETONESUR, PROTEINUR, UROBILINOGEN, NITRITE, LEUKOCYTESUR in the last 72 hours.  Invalid input(s): APPERANCEUR    Imaging: CT ANGIO CHEST PE W OR WO CONTRAST  Result Date: 09/15/2019 CLINICAL DATA:  79 year old female with concern for pulmonary embolism. EXAM: CT ANGIOGRAPHY CHEST WITH CONTRAST TECHNIQUE: Multidetector CT imaging of the chest was performed using the standard protocol during bolus administration of intravenous contrast. Multiplanar CT image reconstructions and MIPs were obtained to evaluate the vascular anatomy. CONTRAST:  175m OMNIPAQUE IOHEXOL 350 MG/ML SOLN COMPARISON:  Chest CT dated 12/03/2018. FINDINGS: Evaluation is limited due to streak artifact caused by patient's arms. Cardiovascular: There is mild to moderate cardiomegaly. No pericardial effusion. The thoracic aorta is unremarkable. The origins of the great vessels of the aortic arch appear patent as visualized. Evaluation of the pulmonary arteries is very limited due to respiratory motion artifact and suboptimal opacification and  visualization of the peripheral branches. No obvious large central pulmonary artery embolus identified. Mediastinum/Nodes: No hilar or mediastinal adenopathy noted. Evaluation however is very limited due to consolidative changes of the adjacent lungs. The esophagus is grossly unremarkable. There is a subcentimeter hypodense focus in the left thyroid lobe which may be artifactual or represent a nodule. No mediastinal fluid collection. Left IJ central venous line with tip close to the cavoatrial junction. Lungs/Pleura: There are moderate bilateral pleural effusions with complete consolidative changes and collapse of the lower lobes bilaterally with air bronchogram. This finding is new since the prior CT. There is partial consolidation of the lingula with air bronchograms as well as near complete consolidative changes of the right middle lobe. There is no pneumothorax. The central airways remain patent. Upper Abdomen: See the report for the abdomen pelvis. Musculoskeletal: Degenerative changes of the spine. No acute osseous pathology. Review of the MIP images confirms the above findings. IMPRESSION: 1. No definite CT evidence of central pulmonary artery embolus. Evaluation of the peripheral branches is very limited due to respiratory motion artifact and suboptimal opacification of the peripheral branches. 2. Moderate bilateral pleural effusions with complete consolidative changes and collapse of the lower lobes bilaterally and near complete consolidative changes of the right middle lobe. Findings may represent a combination of atelectasis and pneumonia. Clinical correlation and follow-up to resolution recommended. 3. Mild to moderate cardiomegaly. Electronically Signed   By: AAnner CreteM.D.   On: 09/15/2019 16:48   CT Angio Abd/Pel w/ and/or w/o  Result Date: 09/15/2019 CLINICAL DATA:  79year old with acute hypoxic respiratory failure. Evaluate for mesenteric ischemia. EXAM: CT ANGIOGRAPHY ABDOMEN AND PELVIS  WITH CONTRAST AND WITHOUT CONTRAST TECHNIQUE: Multidetector CT imaging of the abdomen and pelvis was performed using the standard protocol during bolus administration of intravenous contrast. Multiplanar reconstructed images and MIPs were obtained and reviewed to evaluate the vascular anatomy. CONTRAST:  1083mOMNIPAQUE IOHEXOL 350 MG/ML SOLN COMPARISON:  CT abdomen and pelvis 09/06/2019 and CT abdomen/pelvis 06/30/2018 FINDINGS: VASCULAR Aorta: Small amount of atherosclerotic plaque in the infrarenal abdominal aorta without aneurysm. Celiac: There is not a true celiac trunk. Splenic artery originates directly from the aorta. Common hepatic artery originates directly from the abdominal aorta. Again noted are metallic densities near the gastric cardia and region of the left gastric artery. SMA: SMA is widely patent without  significant plaque, stenosis or aneurysm. Renals: Bilateral renal arteries are patent without significant stenosis. 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 IMA: Proximal IMA is patent. Limited evaluation of the IMA beyond the origin. Inflow: Patent without evidence of aneurysm, dissection, vasculitis or significant stenosis. Proximal Outflow: Proximal femoral arteries are patent bilaterally. Stranding and evidence for a small hematoma in the left groin anterior to the left femoral vessels. No clear evidence for pseudoaneurysm. Veins: Hepatic veins and portal veins are patent. Main mesenteric veins are patent. Splenic vein is patent. IVC and renal veins are patent. Iliac and proximal femoral veins appear to be patent. Central venous catheter in the right common femoral vein that terminates in the distal right common iliac vein. Review of the MIP images confirms the above findings. NON-VASCULAR Lower chest: Bilateral pleural effusions with extensive volume loss and consolidation  in the lower lobes. Heart is enlarged for size. Hepatobiliary: Perihepatic ascites. Perihepatic ascites has slightly decreased from the recent comparison examination. Cholecystectomy. Low-density in the left hepatic lobe probably represents focal fat near the falciform ligament. Pancreas: Large amount of fluid and edema surrounding the pancreas. Cannot exclude small amount of necrosis in the pancreatic neck region. Spleen: Spleen is normal for size. Slightly decreased attenuation along the inferior aspect of spleen may be artifactual. Adrenals/Urinary Tract: Adrenal glands are within normal limits. There is a small fat attenuating lesion along the posterior right kidney that measures 0.9 cm. This structure has not significantly changed since 2020 and suggestive for a small angiomyolipoma. Catheter within the urinary bladder. Small amount of gas in the urinary bladder is likely iatrogenic. Stomach/Bowel: Normal appearance of stomach and duodenum without distension. There is diffuse distension of small bowel containing fluid. Degree of small bowel distension has slightly increased since 09/10/2019. Cannot exclude mild wall thickening involving the rectum, sigmoid colon and descending colon. Some of the small bowel loops in the anterior lower abdomen are poorly defined but there is no clear evidence for extraluminal gas or abscess formation. Lymphatic: No significant lymph node enlargement. Reproductive: Status post hysterectomy. Low-density left ovarian or adnexal structure measures 2.3 x 1.2 cm and this measured 2.6 x 1.6 cm in 2020. This is most compatible with a benign finding. Other: Again noted is free fluid in the pelvis. Slightly increased amount of pelvic fluid without gas. Edema and a small amount of fluid in the right paracolic gutter. Fluid between the right kidney and the liver. Small amount of ascites in the left paracolic gutter and inferior aspect of the spleen. Again noted are surgical clips scattered  throughout the anterior abdominal walls. Increased subcutaneous edema from the recent comparison examination. Musculoskeletal: No acute bone abnormality. IMPRESSION: VASCULAR 1. Main visceral arteries are patent without significant stenosis. 2. Atherosclerotic disease in the abdominal aorta without aneurysm or dissection. Aortic Atherosclerosis (ICD10-I70.0). 3. Main venous structures are patent. 4. 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 5. Focal edema and probably a small hematoma just anterior to left femoral vessels. Question recent catheterization in this area. No clear evidence for a pseudoaneurysm. NON-VASCULAR 1. Extensive edema and fluid centered around the pancreas. Findings are concerning for pancreatitis and there may be a small amount of pancreatic necrosis in the region of the pancreatic neck. No evidence for pseudocyst formation. 2. Dilated loops of small bowel throughout the abdomen and pelvis containing fluid. The small bowel distension has  progressed since 09/23/2019. Findings are nonspecific but could be related to an adynamic ileus. There is enhancement throughout the small bowel walls without significant small bowel wall thickening. 3. Possible wall thickening involving the descending colon and sigmoid colon. These areas are difficult to evaluate due to incomplete distension. Mild colitis cannot be excluded. 4. Persistent abdominopelvic ascites. 5. Extensive volume loss and consolidation at both lung bases with bilateral pleural effusions. 6. Stable fat attenuating lesion in the right kidney that measures up to 0.9 cm. This is suggestive for a small angiomyolipoma. Electronically Signed   By: Markus Daft M.D.   On: 09/15/2019 17:22     Medications:   .  prismasol BGK 4/2.5 500 mL/hr at 09/17/19 1000  .  prismasol BGK 4/2.5 500 mL/hr at 09/17/19 1000  . sodium chloride 250 mL (09/12/19 0430)  . ceFEPime (MAXIPIME) IV Stopped (09/17/19 1033)  . prismasol BGK 4/2.5  2,000 mL/hr at 09/17/19 1217  . sodium phosphate  Dextrose 5% IVPB 43 mL/hr at 09/17/19 1200   . Chlorhexidine Gluconate Cloth  6 each Topical Daily  . heparin injection (subcutaneous)  5,000 Units Subcutaneous Q8H  . mouth rinse  15 mL Mouth Rinse BID  . pantoprazole (PROTONIX) IV  40 mg Intravenous Q24H  . thiamine injection  100 mg Intravenous Q24H   heparin, metoprolol tartrate, ondansetron **OR** ondansetron (ZOFRAN) IV  Assessment/ Plan:  79 y.o. female past history of degenerative disc disease, breast cancer, cervical cancer status post hysterectomy, depression, GERD, hyperlipidemia, hypertension, hypothyroidism, osteophytes, urinary incontinence admitted with nausea, vomiting, diarrhea and found to have generalized enteritis along with pancreatitis.  1.  Acute kidney injury secondary to acute tubular necrosis/chronic kidney disease stage IIIa baseline EGFR 59.  Patient appears to have severe acute kidney injury now likely related to intraabdominal process and hypotension.  Patient received contrast 09/15/2019. -Urine output has dropped post contrast exposure.  Urine output only 75 cc over the preceding 24 hours.  Continue CRRT for now.  Family considering palliative care.  We have discontinued ultrafiltration for now.  2.  Hypotension.  Currently off pressors however blood pressures did trend lower earlier this a.m.  Ultrafiltration discontinued.  3.  Metabolic acidosis.  Serum bicarbonate acceptable at 22.  Continue to monitor.  Continue CRRT for this issue.   LOS: 6 Koralyn Prestage 4/22/202112:41 PM

## 2019-09-18 ENCOUNTER — Inpatient Hospital Stay: Payer: Medicare Other

## 2019-09-18 DIAGNOSIS — Z515 Encounter for palliative care: Secondary | ICD-10-CM

## 2019-09-18 LAB — RENAL FUNCTION PANEL
Albumin: 2.2 g/dL — ABNORMAL LOW (ref 3.5–5.0)
Anion gap: 9 (ref 5–15)
BUN: 17 mg/dL (ref 8–23)
CO2: 26 mmol/L (ref 22–32)
Calcium: 7.6 mg/dL — ABNORMAL LOW (ref 8.9–10.3)
Chloride: 105 mmol/L (ref 98–111)
Creatinine, Ser: 0.75 mg/dL (ref 0.44–1.00)
GFR calc Af Amer: >60 mL/min (ref >60)
GFR calc non Af Amer: >60 mL/min (ref >60)
Glucose, Bld: 125 mg/dL — ABNORMAL HIGH (ref 70–99)
Phosphorus: 1.4 mg/dL — ABNORMAL LOW (ref 2.5–4.6)
Potassium: 4.1 mmol/L (ref 3.5–5.1)
Sodium: 140 mmol/L (ref 135–145)

## 2019-09-18 LAB — CULTURE, BLOOD (ROUTINE X 2)
Culture: NO GROWTH
Culture: NO GROWTH
Special Requests: ADEQUATE

## 2019-09-18 LAB — PROCALCITONIN: Procalcitonin: 10.14 ng/mL

## 2019-09-18 LAB — MAGNESIUM: Magnesium: 2.4 mg/dL (ref 1.7–2.4)

## 2019-09-18 MED ORDER — HALOPERIDOL LACTATE 5 MG/ML IJ SOLN: 0.5000 mg | INTRAMUSCULAR | Status: DC | PRN

## 2019-09-18 MED ORDER — LORAZEPAM 2 MG/ML IJ SOLN
2.0000 mg | INTRAMUSCULAR | Status: DC | PRN
  Administered 2019-09-18: 2 mg via INTRAVENOUS
  Filled 2019-09-18: qty 1

## 2019-09-18 MED ORDER — HYDROMORPHONE HCL 1 MG/ML IJ SOLN
1.0000 mg | INTRAMUSCULAR | Status: DC | PRN
  Administered 2019-09-18: 1 mg via INTRAVENOUS

## 2019-09-18 MED ORDER — GLYCOPYRROLATE 0.2 MG/ML IJ SOLN: 0.2000 mg | INTRAMUSCULAR | Status: DC | PRN

## 2019-09-18 MED ORDER — GLYCOPYRROLATE 1 MG PO TABS
1.0000 mg | ORAL_TABLET | ORAL | Status: DC | PRN
  Filled 2019-09-18: qty 1

## 2019-09-18 MED ORDER — LORAZEPAM 2 MG/ML IJ SOLN: 1.0000 mg | INTRAMUSCULAR | Status: DC | PRN

## 2019-09-18 MED ORDER — HALOPERIDOL LACTATE 2 MG/ML PO CONC
0.5000 mg | ORAL | Status: DC | PRN
  Filled 2019-09-18: qty 0.3

## 2019-09-18 MED ORDER — HYDROMORPHONE HCL 1 MG/ML IJ SOLN
0.5000 mg | INTRAMUSCULAR | Status: DC | PRN
  Administered 2019-09-18: 0.5 mg via INTRAVENOUS
  Filled 2019-09-18 (×2): qty 1

## 2019-09-18 MED ORDER — SODIUM PHOSPHATES 45 MMOLE/15ML IV SOLN
30.0000 mmol | Freq: Once | INTRAVENOUS | Status: DC
  Filled 2019-09-18: qty 10

## 2019-09-18 MED ORDER — HALOPERIDOL 0.5 MG PO TABS
0.5000 mg | ORAL_TABLET | ORAL | Status: DC | PRN
  Filled 2019-09-18: qty 1

## 2019-09-18 MED ORDER — FENTANYL CITRATE (PF) 100 MCG/2ML IJ SOLN
50.0000 ug | Freq: Once | INTRAMUSCULAR | Status: AC
  Administered 2019-09-18: 50 ug via INTRAVENOUS
  Filled 2019-09-18: qty 2

## 2019-09-26 NOTE — Progress Notes (Signed)
   Oct 17, 2019 1200  Clinical Encounter Type  Visited With Family  Visit Type Follow-up  Referral From Chaplain  Consult/Referral To Chaplain  Chaplain visited with patient's daughter, [name]. When asked how she was doing, tears began to flow. Dana immediately started wiping tears away. Chaplain explained that it is okay for the tears to flow. Chaplain told [name] of chaplain availability and left to give her time with her mother.

## 2019-09-26 NOTE — Death Summary Note (Signed)
Name: Olivia [name] MRN: [id number] DOB: 05/27/1941    ADMISSION DATE:  09/12/2019 CONSULTATION DATE:  09/12/2019  REFERRING MD :  Rufina Falco, NP  CHIEF COMPLAINT:  Unresponsiveness  BRIEF PATIENT DESCRIPTION:  79 y.o. Female, DNI, admitted 09/12/19 with Acute Hypoxic Respiratory Failure secondary to ? PE, severe metabolic acidosis, SVT vs. Atrial flutter w/ RVR, elevated troponin (NSTEMI vs. Demand ischemia), Cardiogenic shock (due to rapid rate) & septic shock (in setting of acute pancreatitis & Gastroenteritis), and AKI.  She has been placed on Bicarb gtt, Heparin & Amiodarone gtts.  Cardiology & Nephrology are consulted.  EVENTS  4/23- family met with palliative care and had made patient comfort care per daughter request.  Patient passed away at 06/25/1353she rest in peace  Allergies  Allergen Reactions  . Neomycin Anaphylaxis    blisters  . Lithium   . Meperidine Hcl     REACTION: UNSPECIFIED  . Pravastatin Sodium     Weakness, resolved after stopping medicine 2013  . Systane [Polyethyl Glycol-Propyl Glycol] Rash   .  REVIEW OF SYSTEMS: Unable to obtain due to patient's encephalopathy  SUBJECTIVE:  Patient is very confused, pulling at lines test to have mittens to prevent pulling of lines particularly dialysis catheter. Occasionally more lucid.  States abdominal pain better.  No other complaints reported.  Fidgety in bed.   VITAL SIGNS:     Recent Labs  Lab 09/17/19 0312 09/17/19 1607 2019-10-18 0434  NA 139 137 140  K 4.1 3.9 4.1  CL 105 103 105  CO2 22 23 26   BUN 20 18 17   CREATININE 1.02* 0.84 0.75  GLUCOSE 140* 140* 125*   Recent Labs  Lab 09/14/19 0327 09/15/19 0451 09/16/19 0356  HGB 11.2* 10.9* 11.6*  HCT 32.7* 30.8* 33.5*  WBC 16.4* 16.9* 16.5*  PLT 174 107* 117*   No results found.  ASSESSMENT / PLAN:  Acute Hypoxic Respiratory Failure     -due to pulmonary edema and compressive atelectasis from bilateral pleural effusions   - s/p CT  chest -Supplemental O2 as needed to maintain O2 sats greater than 92% -High flow O2 as needed -changed IV abs to cefepime s/p merem previously -Heparin gtt is d/cd - no PE on CT chest -Lower extremity Dopplers negative for DVT  Circulatory shock           - present on admission          - at this point seems to be distributive due to GI losses -Initially with moderate pancreatitis, Lipase is normalized and there is small area of necrosis on pancrease per CT abd.  - Cannot rule out sepsis - procalicotin is profoundly elevated in context of AKI  - potentially CNS infection since patient does have concurrent encephalopathy - changed abs to cefepime - Patient had taken multiple OTC remedies and per family likely had overdose on unknown substance.    Encephalopathy - toxic metabolic vs septic     - procal >70 improved      -continue CRRT per nephro-appreciate input   - continue cefepime   AKI Anion Gap Metabolic Acidosis -Monitor I&O's / urinary output -Follow BMP -Ensure adequate renal perfusion -Avoid nephrotoxic agents as able -Replace electrolytes as indicated -IV Fluids -appreciate input from nephrology -On CRRT   BEST PRACTICES: DISPOSITION: ICU GOALS OF CARE: DNR in the event of cardiopulmonary arrest, aggressive therapy otherwise VTE PROPHYLAXIS/ANTICOAGULATION: Heparin q8h  Had famly meeting today with both sisters at bedside, have  offered CM and TCC services and they are appreciative.    *This note was dictated using voice recognition software/Dragon.  Despite best efforts to proofread, errors can occur which can change the meaning.  Any change was purely unintentional.    Ottie Glazier, M.D.  Pulmonary & [place]

## 2019-09-26 NOTE — Progress Notes (Signed)
Daily Progress Note   Patient Name: Olivia [name]       Date: 10-05-19 DOB: 21-Jan-1941  Age: 79 y.o. MRN#: [id number] Attending Physician: Ottie Glazier, MD Primary Care Physician: Danelle Berry, NP Admit Date: 09/03/2019  Reason for Consultation/Follow-up: Establishing goals of care  Subjective: Patient unresponsive. Daughter Olivia [name] at bedside.   Length of Stay: 7  Current Medications: Scheduled Meds:    Continuous Infusions: . sodium chloride 250 mL (09/12/19 0430)    PRN Meds: glycopyrrolate **OR** glycopyrrolate **OR** glycopyrrolate, haloperidol **OR** haloperidol **OR** haloperidol lactate, HYDROmorphone (DILAUDID) injection, LORazepam, ondansetron **OR** ondansetron (ZOFRAN) IV  Physical Exam Constitutional:      General: She is not in acute distress.    Appearance: She is ill-appearing.     Comments: Unresponsive on bipap, frail  Skin:    General: Skin is warm and dry.             Vital Signs: BP (!) 111/59   Pulse (!) 109   Temp 98.8 F (37.1 C)   Resp (!) 26   Ht 5\' 2"  (1.575 m)   Wt 74.5 kg   SpO2 94%   BMI 30.04 kg/m  SpO2: SpO2: 94 % O2 Device: O2 Device: Bi-PAP O2 Flow Rate: O2 Flow Rate (L/min): 45 L/min  Intake/output summary:   Intake/Output Summary (Last 24 hours) at 10/05/19 1359 Last data filed at 10/05/2019 1200 Gross per 24 hour  Intake 127.12 ml  Output 260 ml  Net -132.88 ml   LBM: Last BM Date: 09/02/2019 Baseline Weight: Weight: 81.1 kg Most recent weight: Weight: 74.5 kg       Palliative Assessment/Data: PPS 10%      Patient Active Problem List   Diagnosis Date Noted  . Sepsis ([place]) 08/28/2019  . Acute gastroenteritis 09/04/2019  . AKI (acute kidney injury) ([place]) 09/20/2019  . Acute respiratory failure with hypoxia ([place])  09/20/2019  . Metabolic acidosis [id number]  . Acute metabolic encephalopathy [id number]  . Atrial flutter with rapid ventricular response ([place]) 09/14/2019  . Elevated troponin 09/17/2019  . GERD (gastroesophageal reflux disease) 12/04/2018  . Chest pain 12/04/2018  . Gross hematuria 06/23/2015  . Stress incontinence, female 06/23/2015  . Urge incontinence 06/23/2015  . 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 Cervical cancer ([place]) 06/12/2015  . Vaginal atrophy 06/12/2015  . Unstable bladder 06/12/2015  . Stress incontinence 06/12/2015  . Abdominal pain, other specified site 05/08/2013  . Incisional hernia, without obstruction or gangrene 05/08/2013  . Laceration of thumb 08/20/2012  . Sinusitis, acute 07/09/2012  . Foot pain 03/28/2012  . Palpitations 12/25/2011  . Barrett's esophagus 01/04/2011  . 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 OA (osteoarthritis) 01/04/2011  . Renal artery stenosis ([place]) 01/04/2011  . PAIN IN SOFT TISSUES OF LIMB 02/23/2010  . Eczema 02/07/2010  . BREAST CANCER 09/02/2009  . DYSURIA 07/12/2009  . FATIGUE 04/05/2009  . Diarrhea 09/28/2008  . CHEST PAIN 07/12/2008  . AGITATION 12/11/2007  . ANEMIA, IRON DEFICIENCY 10/28/2006  . CONSTIPATION, MILD 10/28/2006  . Hypothyroidism 10/26/2006  . Mixed hyperlipidemia 10/26/2006  . Hypertension 10/26/2006  . ALLERGIC RHINITIS 10/26/2006  . GASTRIC ULCER, ACUTE 10/25/2006  .  FIBROMYALGIA 10/25/2006  . BIPOLAR AFFECTIVE DISORDER, DEPRESSED, HX OF 10/25/2006    Palliative Care Assessment & Plan   HPI: Olivia [name] a 79 y.o.femalewith medical history significant forhypertension, hypothyroidism, bipolar affective disorder, CAD with unremarkable left heart cath July 2020, paroxysmal A. fib not on anticoagulation who was brought brought in by EMS for unresponsiveness being bagged on arrival. EMS reported O2 sat of 77% on room air. Patient  reports a 1 week history of nausea vomiting and diarrhea after eating at a [employer].  Assessment: Called by RN this morning that family will be ready to transition to comfort care today once other family members arrive this afternoon.   Discussed with daughter at bedside who expressed same as above. She did ask that medications be provided in the meantime to ensure comfort. We asked about discontinuing CRRT but should would like to continue until all family arrives.  Later in the day returned to room when patient's other daughter and granddaughter were present. Confirmed they would like to proceed with full comfort care. Medications ordered and given prior to liberation from bipap. Additional medication given following bipap removal. Patient appeared comfortable. CRRT was also stopped. Remained at bedside to ensure comfort and answer questions. Patient passed away with family at bedside. Emotional support and therapeutic listening provided.   Recommendations/Plan:  Full comfort care - patient passed away comfortably with family at bedside  Care plan was discussed with RN and family at bedside  Thank you for allowing the Palliative Medicine Team to assist in the care of this patient.   Total Time 35 minutes Prolonged Time Billed  no       Greater than 50%  of this time was spent counseling and coordinating care related to the above assessment and plan.  Juel Burrow, DNP, [facility] Palliative Medicine Team Team Phone # [phone]  Pager [phone]

## 2019-09-26 NOTE — Progress Notes (Signed)
[facility]  ROUNDING NOTE   Subjective:  It appears that the patient's family is moving towards comfort care. Urine output remains quite low at 55 cc over the preceding 24 hours. Has tolerated CRRT well however.  Objective:  Vital signs in last 24 hours:  Temp:  [98.1 F (36.7 C)-98.8 F (37.1 C)] 98.8 F (37.1 C) (04/23 0700) Pulse Rate:  [104-111] 109 (04/23 1200) Resp:  [19-30] 26 (04/23 1200) BP: (84-160)/(42-91) 111/59 (04/23 1200) SpO2:  [93 %-99 %] 94 % (04/23 1200) FiO2 (%):  [70 %-100 %] 70 % (04/23 0400) Weight:  [74.5 kg] 74.5 kg (04/23 0435)  Weight change: 0 kg Filed Weights   09/16/19 0421 09/17/19 0310 10/03/19 0435  Weight: 77.6 kg 74.5 kg 74.5 kg    Intake/Output: I/O last 3 completed shifts: In: 623.2 [Other:30; IV Piggyback:593.2] Out: 3295 [Urine:75; JOACZ:6606]   Intake/Output this shift:  No intake/output data recorded.  Physical Exam: General: Critically ill-appearing  Head: Normocephalic, atraumatic. Moist oral mucosal membranes  Eyes: Anicteric  Neck: Supple, trachea midline  Lungs:  Scattered rhonchi, on BiPAP  Heart: S1S2, tachycardic  Abdomen:  Distention noted  Extremities: Trace peripheral edema.  Neurologic: Lethargic   Skin: No lesions  Access: Left IJ temporary dialysis catheter    Basic Metabolic Panel: Recent Labs  Lab 09/14/19 0327 09/14/19 1644 09/15/19 0451 09/15/19 1730 09/16/19 0356 09/16/19 0356 09/16/19 1613 09/16/19 1613 09/17/19 0312 09/17/19 1607 10-03-2019 0434  NA 137   < > 140   < > 136  --  139  --  139 137 140  K 4.5   < > 4.3   < > 4.1  --  4.0  --  4.1 3.9 4.1  CL 103   < > 104   < > 103  --  107  --  105 103 105  CO2 19*   < > 23   < > 19*  --  21*  --  22 23 26   GLUCOSE 129*   < > 133*   < > 148*  --  157*  --  140* 140* 125*  BUN 17   < > 14   < > 18  --  20  --  20 18 17   CREATININE 0.91   < > 0.75   < > 0.76  --  0.86  --  1.02* 0.84 0.75  CALCIUM 7.6*   < > 7.7*   < > 7.5*   <  > 7.3*   < > 7.5* 7.5* 7.6*  MG 2.6*  --  2.6*  --  2.4  --   --   --  2.1  --  2.4  PHOS 2.5   < > 1.5*   < > 1.2*  --  2.4*  --  1.3* 2.4* 1.4*   < > = values in this interval not displayed.    Liver Function Tests: Recent Labs  Lab 09/07/2019 1841 08/30/2019 1841 09/12/19 0446 09/12/19 1733 09/14/19 0327 09/14/19 1644 09/16/19 0356 09/16/19 1613 09/17/19 0312 09/17/19 1607 10-03-2019 0434  AST 78*  --  177*  --  232*  --   --   --   --   --   --   ALT 33  --  58*  --  168*  --   --   --   --   --   --   ALKPHOS 56  --  44  --  79  --   --   --   --   --   --  BILITOT 1.1  --  1.4*  --  1.4*  --   --   --   --   --   --   PROT 6.5  --  5.1*  --  5.7*  --   --   --   --   --   --   ALBUMIN 3.5   < > 2.7*   < > 2.5*   < > 2.3* 2.3* 2.3* 2.3* 2.2*   < > = values in this interval not displayed.   Recent Labs  Lab 09/19/2019 2051 09/12/19 0446 09/12/19 0703 09/13/19 0319 09/15/19 1730  LIPASE 517*  --  200* 49 15  AMYLASE  --  320*  --  98  --    No results for input(s): AMMONIA in the last 168 hours.  CBC: Recent Labs  Lab 09/01/2019 1841 09/22/2019 1841 09/12/19 0446 09/13/19 0319 09/14/19 0327 09/15/19 0451 09/16/19 0356  WBC 15.1*   < > 9.5 11.5* 16.4* 16.9* 16.5*  NEUTROABS 12.7*  --   --   --   --   --   --   HGB 16.3*   < > 14.2 12.3 11.2* 10.9* 11.6*  HCT 48.0*   < > 42.3 35.5* 32.7* 30.8* 33.5*  MCV 96.6   < > 98.4 94.2 95.3 92.8 93.8  PLT 298   < > 234 234 174 107* 117*   < > = values in this interval not displayed.    Cardiac Enzymes: No results for input(s): CKTOTAL, CKMB, CKMBINDEX, TROPONINI in the last 168 hours.  BNP: Invalid input(s): POCBNP  CBG: Recent Labs  Lab 09/12/19 0102 09/12/19 1944  GLUCAP 129* 114*    Microbiology: Results for orders placed or performed during the hospital encounter of 09/09/2019  Blood culture (routine x 2)     Status: Abnormal   Collection Time: 08/28/2019  6:41 PM   Specimen: BLOOD  Result Value Ref Range  Status   Specimen Description   Final    BLOOD RIGHT ARM Performed at [facility], [address] [place], [place] [address]    Special Requests   Final    BOTTLES DRAWN AEROBIC AND ANAEROBIC Blood Culture results may not be optimal due to an inadequate volume of blood received in culture bottles Performed at [facility], [address] [place], [place] [address]    Culture  Setup Time   Final    AEROBIC BOTTLE ONLY GRAM POSITIVE COCCI CRITICAL RESULT CALLED TO, READ BACK BY AND VERIFIED WITH: SCOTT HALL ON 09/12/19 AT 2210 [facility]    Culture (A)  Final    STAPHYLOCOCCUS SPECIES (COAGULASE NEGATIVE) THE SIGNIFICANCE OF ISOLATING THIS ORGANISM FROM A SINGLE SET OF BLOOD CULTURES WHEN MULTIPLE SETS ARE DRAWN IS UNCERTAIN. PLEASE NOTIFY THE MICROBIOLOGY DEPARTMENT WITHIN ONE WEEK IF SPECIATION AND SENSITIVITIES ARE REQUIRED. Performed at [facility], [place] [address] [place], [place] [address]    Report Status 09/14/2019 FINAL  Final  Blood Culture ID Panel (Reflexed)     Status: Abnormal   Collection Time: 09/21/2019  6:41 PM  Result Value Ref Range Status   Enterococcus species NOT DETECTED NOT DETECTED Final   Listeria monocytogenes NOT DETECTED NOT DETECTED Final   Staphylococcus species DETECTED (A) NOT DETECTED Final    Comment: Methicillin (oxacillin) susceptible coagulase negative staphylococcus. Possible blood culture contaminant (unless isolated from more than one blood culture draw or clinical case suggests pathogenicity). No antibiotic treatment is indicated for  blood  culture contaminants. CRITICAL RESULT CALLED TO, READ BACK BY AND VERIFIED WITH: SCOTT HALL ON 09/12/19 AT 2210 [facility]    Staphylococcus aureus (BCID) NOT DETECTED NOT DETECTED Final   Methicillin resistance NOT DETECTED NOT DETECTED Final   Streptococcus species NOT DETECTED NOT DETECTED Final   Streptococcus agalactiae NOT DETECTED NOT DETECTED Final   Streptococcus pneumoniae NOT  DETECTED NOT DETECTED Final   Streptococcus pyogenes NOT DETECTED NOT DETECTED Final   Acinetobacter baumannii NOT DETECTED NOT DETECTED Final   Enterobacteriaceae species NOT DETECTED NOT DETECTED Final   Enterobacter cloacae complex NOT DETECTED NOT DETECTED Final   Escherichia coli NOT DETECTED NOT DETECTED Final   Klebsiella oxytoca NOT DETECTED NOT DETECTED Final   Klebsiella pneumoniae NOT DETECTED NOT DETECTED Final   Proteus species NOT DETECTED NOT DETECTED Final   Serratia marcescens NOT DETECTED NOT DETECTED Final   Haemophilus influenzae NOT DETECTED NOT DETECTED Final   Neisseria meningitidis NOT DETECTED NOT DETECTED Final   Pseudomonas aeruginosa NOT DETECTED NOT DETECTED Final   Candida albicans NOT DETECTED NOT DETECTED Final   Candida glabrata NOT DETECTED NOT DETECTED Final   Candida krusei NOT DETECTED NOT DETECTED Final   Candida parapsilosis NOT DETECTED NOT DETECTED Final   Candida tropicalis NOT DETECTED NOT DETECTED Final    Comment: Performed at [facility], [address] [place], [place] [address]  Respiratory Panel by RT PCR (Flu A&B, Covid) - Nasopharyngeal Swab     Status: None   Collection Time: 09/14/2019  7:14 PM   Specimen: Nasopharyngeal Swab  Result Value Ref Range Status   SARS Coronavirus 2 by RT PCR NEGATIVE NEGATIVE Final    Comment: (NOTE) SARS-CoV-2 target nucleic acids are NOT DETECTED. The SARS-CoV-2 RNA is generally detectable in upper respiratoy specimens during the acute phase of infection. The lowest concentration of SARS-CoV-2 viral copies this assay can detect is 131 copies/mL. A negative result does not preclude SARS-Cov-2 infection and should not be used as the sole basis for treatment or other patient management decisions. A negative result may occur with  improper specimen collection/handling, submission of specimen other than nasopharyngeal swab, presence of viral mutation(s) within the areas targeted by this assay,  and inadequate number of viral copies (<131 copies/mL). A negative result must be combined with clinical observations, patient history, and epidemiological information. The expected result is Negative. Fact Sheet for Patients:  PinkCheek.be Fact Sheet for Healthcare Providers:  GravelBags.it This test is not yet ap proved or cleared by the [place] FDA and  has been authorized for detection and/or diagnosis of SARS-CoV-2 by FDA under an Emergency Use Authorization (EUA). This EUA will remain  in effect (meaning this test can be used) for the duration of the COVID-19 declaration under Section 564(b)(1) of the Act, 21 U.S.C. section 360bbb-3(b)(1), unless the authorization is terminated or revoked sooner.    Influenza A by PCR NEGATIVE NEGATIVE Final   Influenza B by PCR NEGATIVE NEGATIVE Final    Comment: (NOTE) The Xpert Xpress SARS-CoV-2/FLU/RSV assay is intended as an aid in  the diagnosis of influenza from Nasopharyngeal swab specimens and  should not be used as a sole basis for treatment. Nasal washings and  aspirates are unacceptable for Xpert Xpress SARS-CoV-2/FLU/RSV  testing. Fact Sheet for Patients: PinkCheek.be Fact Sheet for Healthcare Providers: GravelBags.it This test is not yet approved or cleared by the [place] FDA and  has been authorized for detection and/or diagnosis of SARS-CoV-2 by  FDA  under an Emergency Use Authorization (EUA). This EUA will remain  in effect (meaning this test can be used) for the duration of the  Covid-19 declaration under Section 564(b)(1) of the Act, 21  U.S.C. section 360bbb-3(b)(1), unless the authorization is  terminated or revoked. Performed at [facility], [address] [place], [place] [address]   Blood culture (routine x 2)     Status: None   Collection Time: 09/10/2019  7:35 PM   Specimen:  BLOOD  Result Value Ref Range Status   Specimen Description BLOOD RIGHT ANTECUBITAL  Final   Special Requests   Final    BOTTLES DRAWN AEROBIC AND ANAEROBIC Blood Culture results may not be optimal due to an inadequate volume of blood received in culture bottles   Culture   Final    NO GROWTH 5 DAYS Performed at [facility], [address] [place], [place] [address]    Report Status 09/16/2019 FINAL  Final  Urine culture     Status: None   Collection Time: 09/08/2019 11:51 PM   Specimen: Urine, Random  Result Value Ref Range Status   Specimen Description   Final    URINE, RANDOM Performed at [facility], [address] [place], [place] [address]    Special Requests   Final    NONE Performed at [facility], [address], [place], [place] [address]    Culture   Final    NO GROWTH Performed at [facility], [place] [address], [place], [place] [address]    Report Status 09/13/2019 FINAL  Final  MRSA PCR Screening     Status: None   Collection Time: 09/12/19  1:07 AM   Specimen: Nasopharyngeal  Result Value Ref Range Status   MRSA by PCR NEGATIVE NEGATIVE Final    Comment:        The GeneXpert MRSA Assay (FDA approved for NASAL specimens only), is one component of a comprehensive MRSA colonization surveillance program. It is not intended to diagnose MRSA infection nor to guide or monitor treatment for MRSA infections. Performed at [facility], [address] [place], [place] [address]   CULTURE, BLOOD (ROUTINE X 2) w Reflex to ID Panel     Status: None   Collection Time: 09/13/19  8:37 PM   Specimen: BLOOD LEFT HAND  Result Value Ref Range Status   Specimen Description BLOOD LEFT HAND  Final   Special Requests   Final    BOTTLES DRAWN AEROBIC AND ANAEROBIC Blood Culture adequate volume   Culture   Final    NO GROWTH 5 DAYS Performed at [facility], [address] [place], [place] [address]    Report Status  2019-10-12 FINAL  Final  CULTURE, BLOOD (ROUTINE X 2) w Reflex to ID Panel     Status: None   Collection Time: 09/13/19  8:52 PM   Specimen: BLOOD RIGHT HAND  Result Value Ref Range Status   Specimen Description BLOOD RIGHT HAND  Final   Special Requests   Final    BOTTLES DRAWN AEROBIC AND ANAEROBIC Blood Culture results may not be optimal due to an inadequate volume of blood received in culture bottles   Culture   Final    NO GROWTH 5 DAYS Performed at [facility], [address], [place],  [address]    Report Status 10/12/2019 FINAL  Final    Coagulation Studies: No results for input(s): LABPROT, INR in the last 72  hours.  Urinalysis: No results for input(s): COLORURINE, LABSPEC, PHURINE, GLUCOSEU, HGBUR, BILIRUBINUR, KETONESUR, PROTEINUR, UROBILINOGEN, NITRITE, LEUKOCYTESUR in the last 72 hours.  Invalid input(s): APPERANCEUR    Imaging: DG Chest Port 1 View  Result Date: Oct 06, 2019 CLINICAL DATA:  Chest pain. EXAM: PORTABLE CHEST 1 VIEW COMPARISON:  09/12/2019 FINDINGS: The left IJ central venous catheter is stable. The external pacer paddles have been removed. The cardiac silhouette, mediastinal and hilar contours are stable. Persistent small effusions and bibasilar atelectasis. Mild central vascular congestion without overt pulmonary edema. IMPRESSION: Persistent small effusions and bibasilar atelectasis. Electronically Signed   By: Marijo Sanes M.D.   On: Oct 06, 2019 10:24     Medications:   .  prismasol BGK 4/2.5 500 mL/hr at 06-Oct-2019 0514  .  prismasol BGK 4/2.5 500 mL/hr at 10/06/19 0513  . sodium chloride 250 mL (09/12/19 0430)  . ceFEPime (MAXIPIME) IV Stopped (09/17/19 2238)  . prismasol BGK 4/2.5 2,000 mL/hr at 10/06/19 1105  . sodium phosphate  Dextrose 5% IVPB     . Chlorhexidine Gluconate Cloth  6 each Topical Daily  . heparin injection (subcutaneous)  5,000 Units Subcutaneous Q8H  . mouth rinse  15 mL Mouth Rinse BID  . pantoprazole  (PROTONIX) IV  40 mg Intravenous Q24H  . thiamine injection  100 mg Intravenous Q24H   heparin, HYDROmorphone (DILAUDID) injection, LORazepam, metoprolol tartrate, ondansetron **OR** ondansetron (ZOFRAN) IV  Assessment/ Plan:  79 y.o. female past history of degenerative disc disease, breast cancer, cervical cancer status post hysterectomy, depression, GERD, hyperlipidemia, hypertension, hypothyroidism, osteophytes, urinary incontinence admitted with nausea, vomiting, diarrhea and found to have generalized enteritis along with pancreatitis.  1.  Acute kidney injury secondary to acute tubular necrosis/chronic kidney disease stage IIIa baseline EGFR 59.  Patient appears to have severe acute kidney injury now likely related to intraabdominal process and hypotension.  Patient received contrast 09/15/2019. -Patient remains quite oliguric with urine output of only 55 cc over the preceding 24 hours.  We will go ahead and discontinue CRRT as the family is moving toward CRRT.  2.  Hypotension.  Patient not requiring pressors at the moment.  3.  Metabolic acidosis.  Serum bicarbonate was acceptable at 26 this a.m.  However we are stopping CRRT.   LOS: 7 Lewin Pellow 2021/09/1110:57 PM

## 2019-09-26 NOTE — Progress Notes (Signed)
Per Marcie Bal, patients daughter awaiting for other sibling to get to the hospital for comfort care.

## 2019-09-26 NOTE — Progress Notes (Signed)
   2019-10-11 1312  Clinical Encounter Type  Visited With Patient and family together  Visit Type Follow-up;Spiritual support;Death  Referral From Nurse  Consult/Referral To Chaplain  At 1:12 chaplain received page, nurse made request for chaplain to come. When chaplain arrived patient was actively dying with two daughters and only granddaughter at her bedside. After staff completed caring for patient, nurse asked chaplain to pray. Chaplain and family held hands as chaplain prayed for a peaceful transition for Olivia [name] and comfort and peace for the family. Nurse came in and gave patient medicine to calm and relax her. Shortly thereafter Olivia [name] transitioned.

## 2019-09-26 NOTE — Progress Notes (Signed)
Patient's CRRT discontinued. Patient transitioned to comfort care. PRN's given. Comfort care cart ordered for patients family, pastoral in and out throughout the day. Patient pronounced at 13:53. This RN and Shyrl Numbers RN pronounced. Patients EKG asystole, no heart sounds or lung sounds heard during auscultation. Supervisor notified-She will call [employer] ref [phone]. Notified Dr. Lanney Gins. Family at bedside.

## 2019-09-26 DEATH — deceased

## 2019-10-05 ENCOUNTER — Encounter: Payer: Self-pay | Admitting: Dermatology

## 2019-10-07 IMAGING — MR MR CERVICAL SPINE W/O CM
5 series · 33 of 48 positions shown · non-contrast
Comparison: Cervical spine CT 10/09/2016.

CLINICAL DATA: Neck pain.  Cervical disc disease.

EXAM:
MRI CERVICAL SPINE WITHOUT CONTRAST
TECHNIQUE: Multiplanar, multisequence MR imaging of the cervical spine was
performed. No intravenous contrast was administered.

[Series 3: T2 · sagittal · 3.0mm · 0.78mm/px · 6 of 15 slices shown (1 of 2)]
[im 1/15]
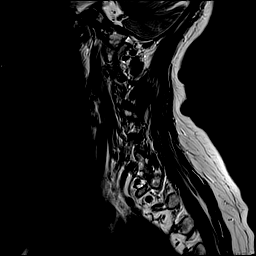
[im 3/15]
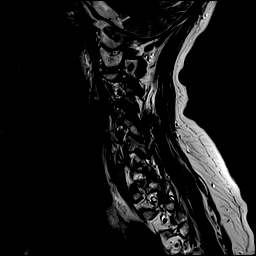
[im 6/15]
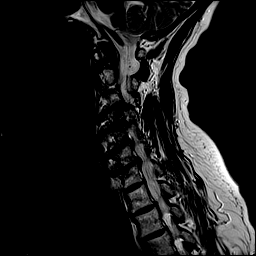
[im 9/15]
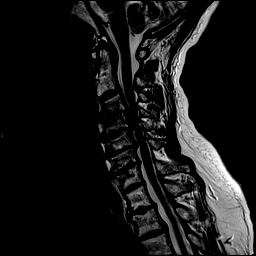
[im 12/15]
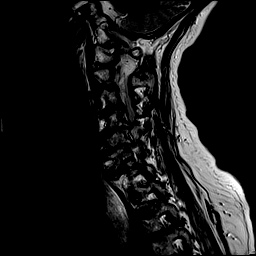
[im 15/15]
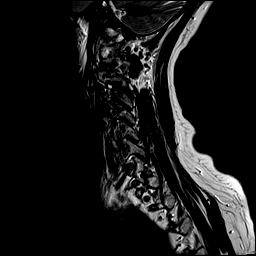

[Series 4: T1 · sagittal · 3.0mm · 0.70mm/px · 5 of 15 slices shown]
[im 1/15]
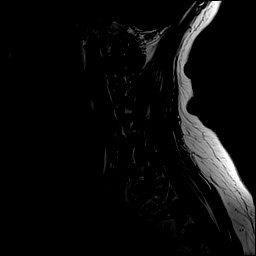
[im 4/15]
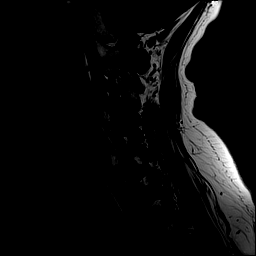
[im 8/15]
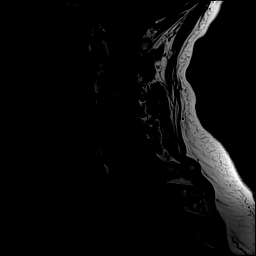
[im 11/15]
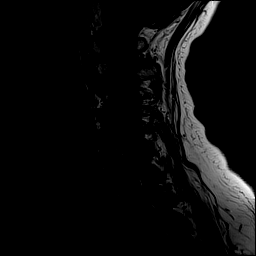
[im 15/15]
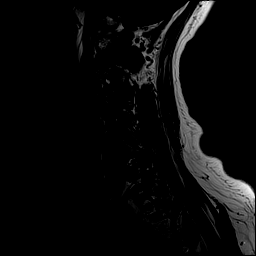

[Series 5: STIR · sagittal · 3.0mm · 0.35mm/px · 5 of 15 slices shown]
[im 1/15]
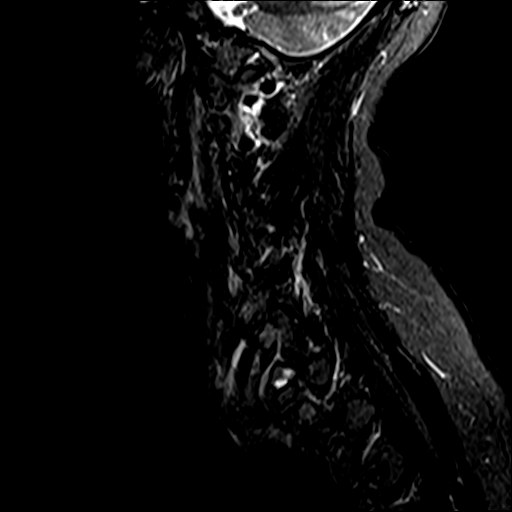
[im 4/15]
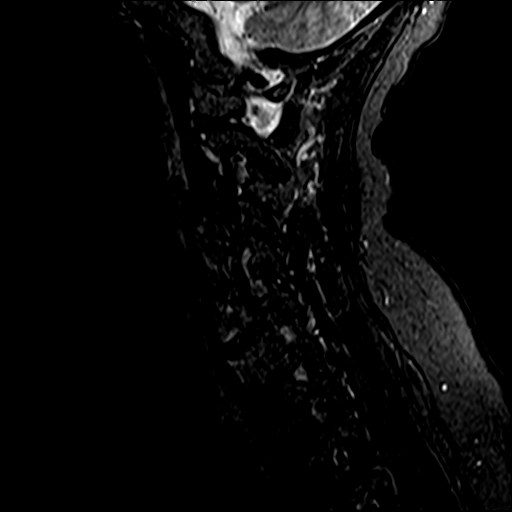
[im 8/15]
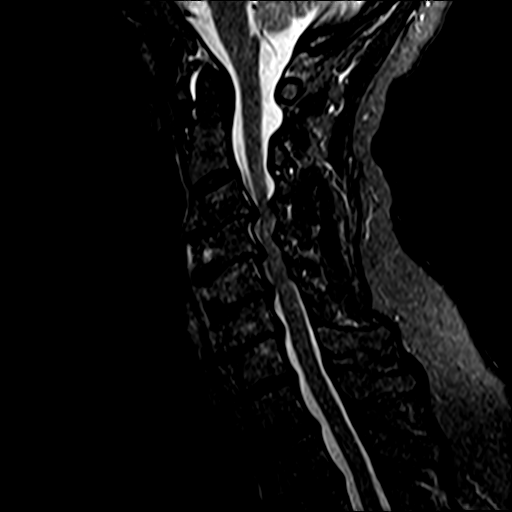
[im 11/15]
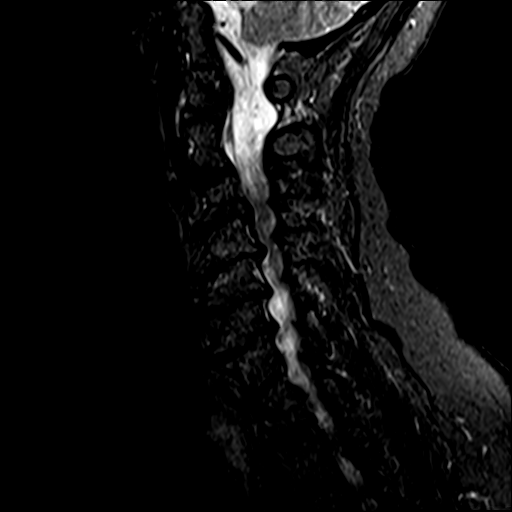
[im 15/15]
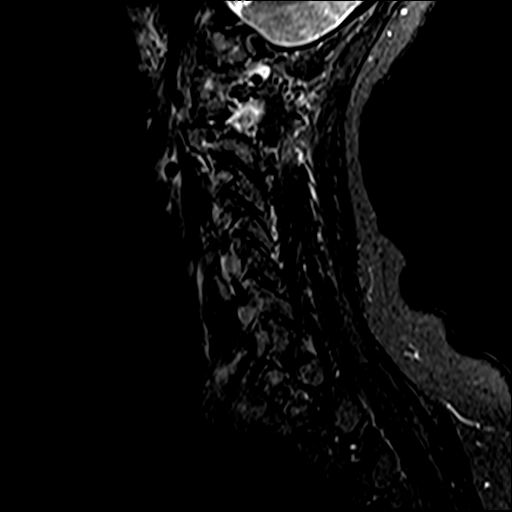

[Series 6: T2 · axial · 3.0mm · 0.70mm/px · z∈[-131,+27]mm · 10 of 44 slices shown (2 of 2)]
[im 3/44]
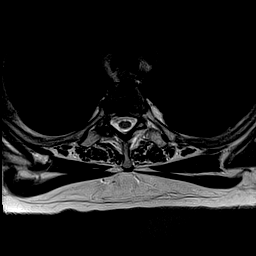
[im 6/44]
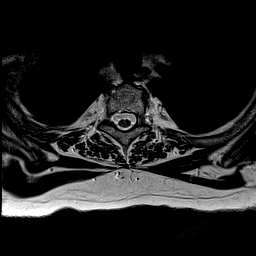
[im 9/44]
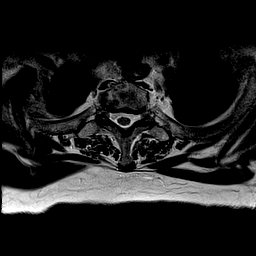
[im 15/44]
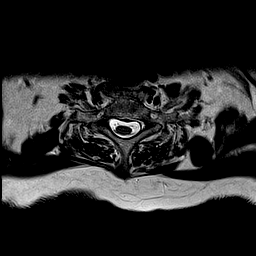
[im 21/44]
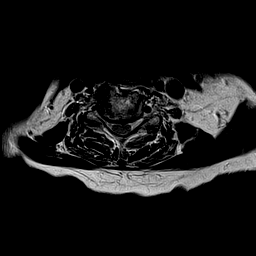
[im 23/44]
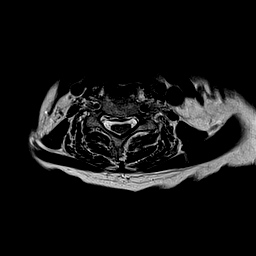
[im 26/44]
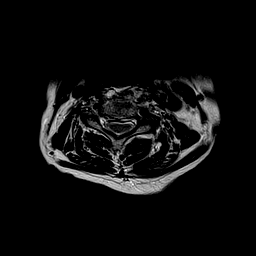
[im 32/44]
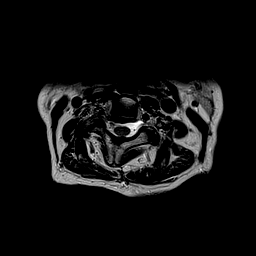
[im 38/44]
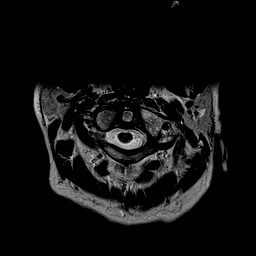
[im 44/44]
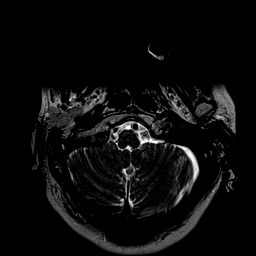

[Series 7: mpgr ax · axial · 3.0mm · 0.35mm/px · z∈[-131,+4]mm · 7 of 44 slices shown]
[im 3/44]
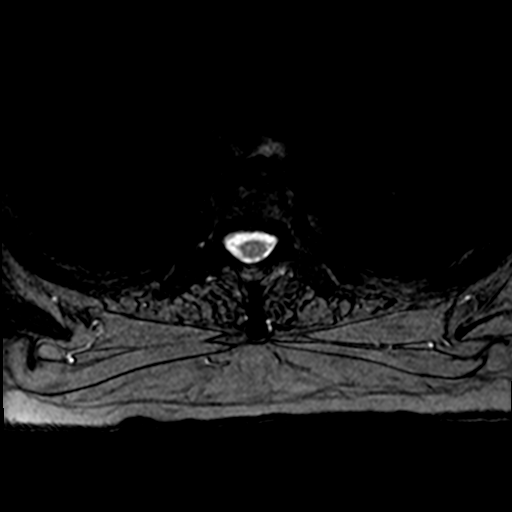
[im 9/44]
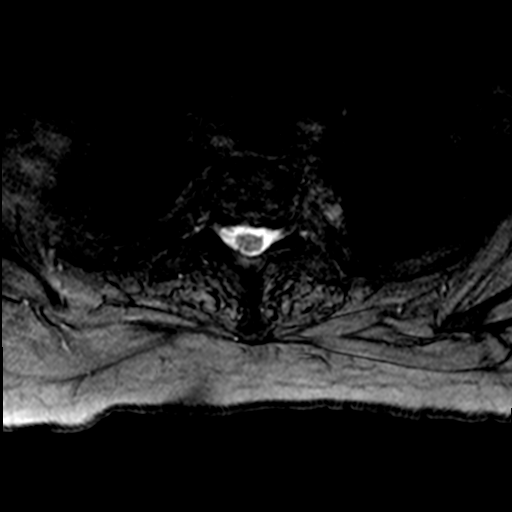
[im 15/44]
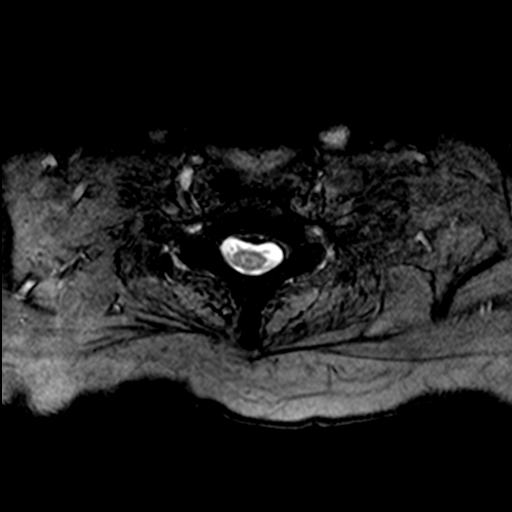
[im 21/44]
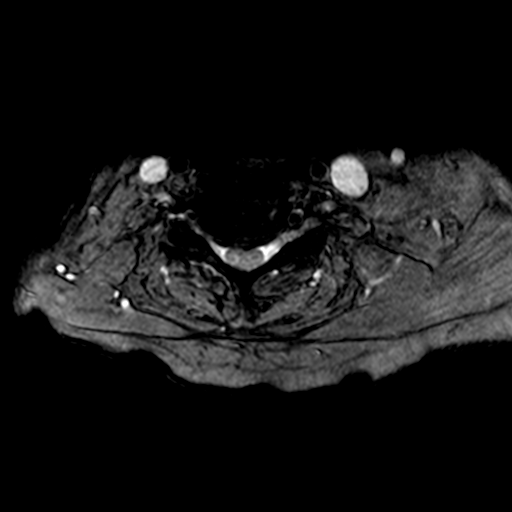
[im 26/44]
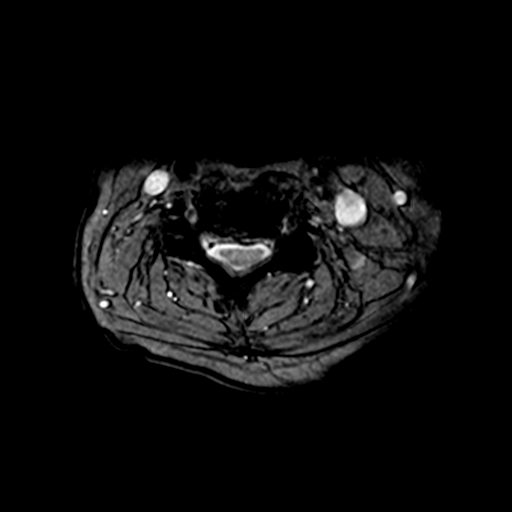
[im 32/44]
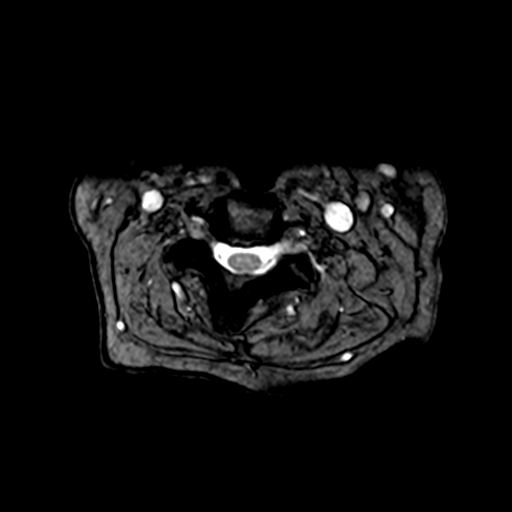
[im 38/44]
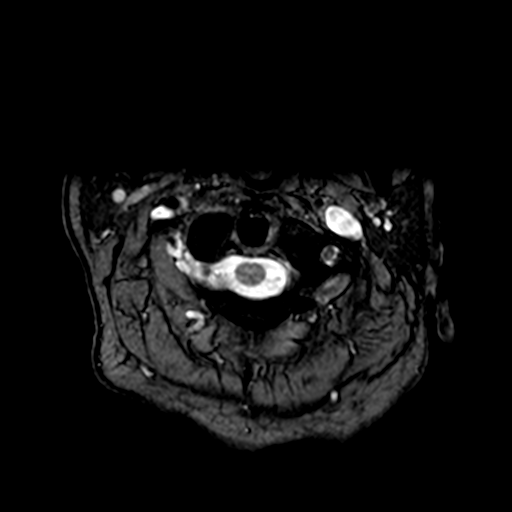

[33 of 48 positions shown; findings below may reference images not displayed]

FINDINGS: Alignment: Slight anterolisthesis at C2-3.  Mild dextrocurvature.

Vertebrae: No fracture, evidence of discitis, or bone lesion.
Prominent degenerative sclerosis from C3-4 to C6-7.

Cord: Degenerative deformity from C3-4 to C5-6. No cord signal
abnormality.

Posterior Fossa, vertebral arteries, paraspinal tissues: Negative.

Disc levels:

C2-3: Facet arthropathy with asymmetric advanced spurring on the
right. Asymmetric right uncovertebral spurring with moderate right
foraminal stenosis. Patent spinal canal.

C3-4: Severe degenerative disc narrowing with endplate and
uncovertebral ridging. Uncovertebral spurring is bulkier towards the
left. Facet spurring and ligamentum flavum thickening. Degenerative
spinal stenosis with cord flattening. Advanced left foraminal
impingement.

C4-5: Advanced degenerative disc disease with endplate and
uncovertebral ridging. Ligamentum flavum buckling. Advanced right
and mild to moderate left foraminal stenosis. Spinal stenosis with
cord flattening.

C5-6: Advanced degenerative disc narrowing with endplate and
uncovertebral ridging. Posterior disc osteophyte complex causes mild
flattening of the ventral cord. Asymmetric right uncovertebral
spurring and foraminal impingement.

C6-7: Advanced degenerative disc narrowing with endplate
uncovertebral ridging. Patent canal and foramina.

C7-T1:Disc narrowing without herniation. Negative facets. No
impingement.
IMPRESSION: 1. Advanced degenerative disc disease from C3-4 to C6-7. Spinal
stenosis with degenerative cord flattening from C3-4 to C5-6.
2. Multilevel foraminal stenosis with advanced impingement on the
left at C3-4 and on the right at C4-5 and C5-6.
